# Patient Record
Sex: Female | Born: 1935
Health system: Southern US, Community
[De-identification: ages and names within clinical notes are randomized; demographics above are authoritative.]

## PROBLEM LIST (undated history)

## (undated) DIAGNOSIS — I10 Essential (primary) hypertension: Secondary | ICD-10-CM

## (undated) DIAGNOSIS — Z9889 Other specified postprocedural states: Secondary | ICD-10-CM

## (undated) DIAGNOSIS — E785 Hyperlipidemia, unspecified: Secondary | ICD-10-CM

## (undated) DIAGNOSIS — K219 Gastro-esophageal reflux disease without esophagitis: Secondary | ICD-10-CM

## (undated) DIAGNOSIS — C801 Malignant (primary) neoplasm, unspecified: Secondary | ICD-10-CM

## (undated) DIAGNOSIS — D126 Benign neoplasm of colon, unspecified: Secondary | ICD-10-CM

## (undated) DIAGNOSIS — I4891 Unspecified atrial fibrillation: Secondary | ICD-10-CM

## (undated) DIAGNOSIS — R112 Nausea with vomiting, unspecified: Secondary | ICD-10-CM

## (undated) DIAGNOSIS — J343 Hypertrophy of nasal turbinates: Secondary | ICD-10-CM

## (undated) DIAGNOSIS — J342 Deviated nasal septum: Secondary | ICD-10-CM

## (undated) DIAGNOSIS — C8330 Diffuse large B-cell lymphoma, unspecified site: Secondary | ICD-10-CM

## (undated) DIAGNOSIS — M47816 Spondylosis without myelopathy or radiculopathy, lumbar region: Secondary | ICD-10-CM

## (undated) DIAGNOSIS — R42 Dizziness and giddiness: Secondary | ICD-10-CM

## (undated) DIAGNOSIS — N3281 Overactive bladder: Secondary | ICD-10-CM

## (undated) DIAGNOSIS — L299 Pruritus, unspecified: Secondary | ICD-10-CM

## (undated) HISTORY — DX: Pruritus, unspecified: L29.9

## (undated) HISTORY — DX: Overactive bladder: N32.81

## (undated) HISTORY — DX: Unspecified atrial fibrillation: I48.91

## (undated) HISTORY — DX: Benign neoplasm of colon, unspecified: D12.6

## (undated) HISTORY — PX: BUNIONECTOMY: SHX129

## (undated) HISTORY — DX: Diffuse large B-cell lymphoma, unspecified site: C83.30

## (undated) HISTORY — PX: HAMMER TOE SURGERY: SHX385

## (undated) HISTORY — DX: Dizziness and giddiness: R42

---

## 2001-03-01 ENCOUNTER — Ambulatory Visit (HOSPITAL_COMMUNITY): Admission: RE | Admit: 2001-03-01 | Discharge: 2001-03-01 | Payer: Self-pay | Admitting: Unknown Physician Specialty

## 2001-03-01 ENCOUNTER — Encounter: Payer: Self-pay | Admitting: Unknown Physician Specialty

## 2002-03-03 ENCOUNTER — Ambulatory Visit (HOSPITAL_COMMUNITY): Admission: RE | Admit: 2002-03-03 | Discharge: 2002-03-03 | Payer: Self-pay | Admitting: Unknown Physician Specialty

## 2002-03-03 ENCOUNTER — Encounter: Payer: Self-pay | Admitting: Unknown Physician Specialty

## 2002-09-28 ENCOUNTER — Ambulatory Visit (HOSPITAL_COMMUNITY): Admission: RE | Admit: 2002-09-28 | Discharge: 2002-09-28 | Payer: Self-pay | Admitting: Internal Medicine

## 2002-09-28 HISTORY — PX: COLONOSCOPY: SHX174

## 2003-03-14 ENCOUNTER — Encounter: Payer: Self-pay | Admitting: Unknown Physician Specialty

## 2003-03-14 ENCOUNTER — Ambulatory Visit (HOSPITAL_COMMUNITY): Admission: RE | Admit: 2003-03-14 | Discharge: 2003-03-14 | Payer: Self-pay | Admitting: Unknown Physician Specialty

## 2003-10-08 ENCOUNTER — Encounter: Payer: Self-pay | Admitting: Cardiology

## 2003-10-08 ENCOUNTER — Inpatient Hospital Stay (HOSPITAL_COMMUNITY): Admission: AD | Admit: 2003-10-08 | Discharge: 2003-10-09 | Payer: Self-pay | Admitting: Cardiology

## 2003-10-22 ENCOUNTER — Ambulatory Visit (HOSPITAL_COMMUNITY): Admission: RE | Admit: 2003-10-22 | Discharge: 2003-10-22 | Payer: Self-pay | Admitting: Cardiology

## 2004-03-24 ENCOUNTER — Ambulatory Visit (HOSPITAL_COMMUNITY): Admission: RE | Admit: 2004-03-24 | Discharge: 2004-03-24 | Payer: Self-pay | Admitting: Family Medicine

## 2004-06-03 ENCOUNTER — Ambulatory Visit: Payer: Self-pay | Admitting: *Deleted

## 2004-06-18 ENCOUNTER — Ambulatory Visit: Payer: Self-pay | Admitting: Family Medicine

## 2004-10-23 ENCOUNTER — Ambulatory Visit: Payer: Self-pay | Admitting: Family Medicine

## 2004-10-30 ENCOUNTER — Ambulatory Visit (HOSPITAL_COMMUNITY): Admission: RE | Admit: 2004-10-30 | Discharge: 2004-10-30 | Payer: Self-pay | Admitting: Family Medicine

## 2004-11-04 ENCOUNTER — Ambulatory Visit: Payer: Self-pay | Admitting: Family Medicine

## 2004-11-17 ENCOUNTER — Ambulatory Visit: Payer: Self-pay | Admitting: Family Medicine

## 2005-03-03 ENCOUNTER — Ambulatory Visit: Payer: Self-pay | Admitting: Family Medicine

## 2005-03-10 ENCOUNTER — Ambulatory Visit: Payer: Self-pay | Admitting: Family Medicine

## 2005-03-26 ENCOUNTER — Ambulatory Visit (HOSPITAL_COMMUNITY): Admission: RE | Admit: 2005-03-26 | Discharge: 2005-03-26 | Payer: Self-pay | Admitting: Family Medicine

## 2005-04-09 ENCOUNTER — Other Ambulatory Visit: Admission: RE | Admit: 2005-04-09 | Discharge: 2005-04-09 | Payer: Self-pay | Admitting: Unknown Physician Specialty

## 2005-04-28 ENCOUNTER — Ambulatory Visit: Payer: Self-pay | Admitting: Family Medicine

## 2005-09-02 ENCOUNTER — Ambulatory Visit: Payer: Self-pay | Admitting: Family Medicine

## 2005-10-13 ENCOUNTER — Ambulatory Visit: Payer: Self-pay | Admitting: Family Medicine

## 2005-10-20 ENCOUNTER — Ambulatory Visit (HOSPITAL_COMMUNITY): Admission: RE | Admit: 2005-10-20 | Discharge: 2005-10-20 | Payer: Self-pay | Admitting: Orthopaedic Surgery

## 2005-10-20 ENCOUNTER — Encounter (INDEPENDENT_AMBULATORY_CARE_PROVIDER_SITE_OTHER): Payer: Self-pay | Admitting: *Deleted

## 2005-11-26 ENCOUNTER — Ambulatory Visit: Payer: Self-pay | Admitting: Family Medicine

## 2005-12-15 ENCOUNTER — Ambulatory Visit (HOSPITAL_COMMUNITY): Admission: RE | Admit: 2005-12-15 | Discharge: 2005-12-15 | Payer: Self-pay | Admitting: Ophthalmology

## 2006-01-11 ENCOUNTER — Ambulatory Visit: Payer: Self-pay | Admitting: Family Medicine

## 2006-01-19 ENCOUNTER — Ambulatory Visit (HOSPITAL_COMMUNITY): Admission: RE | Admit: 2006-01-19 | Discharge: 2006-01-19 | Payer: Self-pay | Admitting: Ophthalmology

## 2006-04-01 ENCOUNTER — Ambulatory Visit (HOSPITAL_COMMUNITY): Admission: RE | Admit: 2006-04-01 | Discharge: 2006-04-01 | Payer: Self-pay | Admitting: Family Medicine

## 2006-05-13 ENCOUNTER — Ambulatory Visit: Payer: Self-pay | Admitting: Family Medicine

## 2006-06-28 ENCOUNTER — Ambulatory Visit: Payer: Self-pay | Admitting: Family Medicine

## 2006-07-01 ENCOUNTER — Ambulatory Visit (HOSPITAL_COMMUNITY): Admission: RE | Admit: 2006-07-01 | Discharge: 2006-07-01 | Payer: Self-pay | Admitting: Family Medicine

## 2006-07-02 ENCOUNTER — Ambulatory Visit: Payer: Self-pay | Admitting: Cardiology

## 2006-07-07 ENCOUNTER — Ambulatory Visit: Payer: Self-pay | Admitting: Cardiology

## 2006-07-07 ENCOUNTER — Ambulatory Visit (HOSPITAL_COMMUNITY): Admission: RE | Admit: 2006-07-07 | Discharge: 2006-07-07 | Payer: Self-pay | Admitting: Family Medicine

## 2006-09-21 ENCOUNTER — Ambulatory Visit: Payer: Self-pay | Admitting: Family Medicine

## 2007-04-05 ENCOUNTER — Ambulatory Visit (HOSPITAL_COMMUNITY): Admission: RE | Admit: 2007-04-05 | Discharge: 2007-04-05 | Payer: Self-pay | Admitting: Family Medicine

## 2007-06-09 DIAGNOSIS — R112 Nausea with vomiting, unspecified: Secondary | ICD-10-CM

## 2007-06-09 DIAGNOSIS — Z9889 Other specified postprocedural states: Secondary | ICD-10-CM

## 2007-06-09 HISTORY — DX: Other specified postprocedural states: Z98.890

## 2007-06-09 HISTORY — DX: Other specified postprocedural states: R11.2

## 2007-06-09 HISTORY — PX: TOTAL HIP ARTHROPLASTY: SHX124

## 2007-12-12 ENCOUNTER — Inpatient Hospital Stay (HOSPITAL_COMMUNITY): Admission: RE | Admit: 2007-12-12 | Discharge: 2007-12-15 | Payer: Self-pay | Admitting: Orthopedic Surgery

## 2008-04-05 ENCOUNTER — Ambulatory Visit (HOSPITAL_COMMUNITY): Admission: RE | Admit: 2008-04-05 | Discharge: 2008-04-05 | Payer: Self-pay | Admitting: Family Medicine

## 2008-06-08 HISTORY — PX: CATARACT EXTRACTION W/ INTRAOCULAR LENS  IMPLANT, BILATERAL: SHX1307

## 2008-08-30 ENCOUNTER — Encounter: Payer: Self-pay | Admitting: Internal Medicine

## 2008-09-11 ENCOUNTER — Encounter: Payer: Self-pay | Admitting: Internal Medicine

## 2008-09-11 ENCOUNTER — Ambulatory Visit (HOSPITAL_COMMUNITY): Admission: RE | Admit: 2008-09-11 | Discharge: 2008-09-11 | Payer: Self-pay | Admitting: Internal Medicine

## 2008-09-11 ENCOUNTER — Telehealth (INDEPENDENT_AMBULATORY_CARE_PROVIDER_SITE_OTHER): Payer: Self-pay | Admitting: *Deleted

## 2008-09-11 ENCOUNTER — Ambulatory Visit: Payer: Self-pay | Admitting: Internal Medicine

## 2008-09-11 HISTORY — PX: COLONOSCOPY: SHX174

## 2008-09-12 ENCOUNTER — Encounter: Payer: Self-pay | Admitting: Internal Medicine

## 2008-09-13 ENCOUNTER — Telehealth (INDEPENDENT_AMBULATORY_CARE_PROVIDER_SITE_OTHER): Payer: Self-pay

## 2008-09-17 ENCOUNTER — Telehealth (INDEPENDENT_AMBULATORY_CARE_PROVIDER_SITE_OTHER): Payer: Self-pay | Admitting: *Deleted

## 2008-09-18 ENCOUNTER — Encounter: Payer: Self-pay | Admitting: Internal Medicine

## 2008-10-06 DIAGNOSIS — D126 Benign neoplasm of colon, unspecified: Secondary | ICD-10-CM

## 2008-10-06 HISTORY — DX: Benign neoplasm of colon, unspecified: D12.6

## 2008-10-19 ENCOUNTER — Encounter: Payer: Self-pay | Admitting: Internal Medicine

## 2008-10-29 ENCOUNTER — Ambulatory Visit: Payer: Self-pay | Admitting: Internal Medicine

## 2008-10-29 ENCOUNTER — Inpatient Hospital Stay (HOSPITAL_COMMUNITY): Admission: RE | Admit: 2008-10-29 | Discharge: 2008-11-02 | Payer: Self-pay | Admitting: General Surgery

## 2008-10-29 ENCOUNTER — Encounter (INDEPENDENT_AMBULATORY_CARE_PROVIDER_SITE_OTHER): Payer: Self-pay | Admitting: General Surgery

## 2008-10-29 HISTORY — PX: COLON SURGERY: SHX602

## 2008-10-30 ENCOUNTER — Encounter (INDEPENDENT_AMBULATORY_CARE_PROVIDER_SITE_OTHER): Payer: Self-pay

## 2008-11-12 DIAGNOSIS — Z8669 Personal history of other diseases of the nervous system and sense organs: Secondary | ICD-10-CM | POA: Insufficient documentation

## 2008-11-12 DIAGNOSIS — E785 Hyperlipidemia, unspecified: Secondary | ICD-10-CM | POA: Insufficient documentation

## 2008-11-12 DIAGNOSIS — I10 Essential (primary) hypertension: Secondary | ICD-10-CM

## 2008-11-13 ENCOUNTER — Encounter: Payer: Self-pay | Admitting: Physician Assistant

## 2008-11-13 ENCOUNTER — Ambulatory Visit: Payer: Self-pay | Admitting: Cardiology

## 2008-11-13 DIAGNOSIS — I498 Other specified cardiac arrhythmias: Secondary | ICD-10-CM

## 2008-11-13 DIAGNOSIS — I4891 Unspecified atrial fibrillation: Secondary | ICD-10-CM

## 2008-11-20 ENCOUNTER — Ambulatory Visit: Payer: Self-pay | Admitting: Cardiology

## 2008-11-20 ENCOUNTER — Encounter: Payer: Self-pay | Admitting: Physician Assistant

## 2008-11-27 ENCOUNTER — Ambulatory Visit: Payer: Self-pay | Admitting: Cardiology

## 2008-12-03 ENCOUNTER — Telehealth: Payer: Self-pay | Admitting: Cardiology

## 2008-12-03 ENCOUNTER — Encounter: Payer: Self-pay | Admitting: Internal Medicine

## 2008-12-27 ENCOUNTER — Encounter: Payer: Self-pay | Admitting: Internal Medicine

## 2009-04-09 ENCOUNTER — Ambulatory Visit (HOSPITAL_COMMUNITY): Admission: RE | Admit: 2009-04-09 | Discharge: 2009-04-09 | Payer: Self-pay | Admitting: Family Medicine

## 2009-07-16 ENCOUNTER — Ambulatory Visit (HOSPITAL_COMMUNITY): Admission: RE | Admit: 2009-07-16 | Discharge: 2009-07-16 | Payer: Self-pay | Admitting: Ophthalmology

## 2009-11-28 ENCOUNTER — Encounter (INDEPENDENT_AMBULATORY_CARE_PROVIDER_SITE_OTHER): Payer: Self-pay

## 2009-12-04 ENCOUNTER — Telehealth (INDEPENDENT_AMBULATORY_CARE_PROVIDER_SITE_OTHER): Payer: Self-pay

## 2009-12-04 ENCOUNTER — Encounter: Payer: Self-pay | Admitting: Internal Medicine

## 2009-12-10 ENCOUNTER — Telehealth (INDEPENDENT_AMBULATORY_CARE_PROVIDER_SITE_OTHER): Payer: Self-pay | Admitting: *Deleted

## 2010-01-06 ENCOUNTER — Ambulatory Visit: Payer: Self-pay | Admitting: Internal Medicine

## 2010-01-06 ENCOUNTER — Ambulatory Visit (HOSPITAL_COMMUNITY): Admission: RE | Admit: 2010-01-06 | Discharge: 2010-01-06 | Payer: Self-pay | Admitting: Internal Medicine

## 2010-01-06 HISTORY — PX: COLONOSCOPY: SHX174

## 2010-04-11 ENCOUNTER — Ambulatory Visit (HOSPITAL_COMMUNITY): Admission: RE | Admit: 2010-04-11 | Discharge: 2010-04-11 | Payer: Self-pay | Admitting: Family Medicine

## 2010-06-29 ENCOUNTER — Encounter: Payer: Self-pay | Admitting: Family Medicine

## 2010-07-08 NOTE — Letter (Signed)
Summary: Recall Colonoscopy/Endoscopy, Change to Office Visit  [facility]  [address]   [place], [place] [address]   Phone: [phone]  Fax: [phone]      November 28, 2009   [place] [address] [place], [place]  [address] 1922/07/22   Dear Sierra Good,   According to our records, it is time for you to schedule a Colonoscopy/Endoscopy. However, after reviewing your medical record, we recommend an office visit in order to determine your need for a repeat procedure.  Please call [phone] at your convenience to schedule an office visit. If you have any questions or concerns, please feel free to contact our office.   Sincerely,   Cloria Spring LPN  [facility] Ph: [phone]   Fax: [phone]

## 2010-07-08 NOTE — Progress Notes (Signed)
Summary: [facility] TCS  Phone Note Call from Patient   Reason for Call: Talk to Nurse Summary of Call: Pt LMOM. She wants to [facility] her TCS that is scheduled for the 18th to another day where she will have transportation. [phone] Initial call taken by: Diana Eves,  December 10, 2009 3:58 PM     Appended Document: [facility] TCS Pt rescheduled to 01/06/2010 @ 8:15 AM. Selena Batten is aware.

## 2010-07-08 NOTE — Progress Notes (Signed)
Summary: tcs ?s  Phone Note Call from Patient Call back at Home Phone [phone]   Caller: Patient Summary of Call: pt called- she is on recall list to have a repeat tcs in 10/2009. pt had a colectomy last year and is on ASA. She is scheduled for tcs on 12/23/09 but informed her that she may need ov first and that I would check with RMR and let her know.   1- Does pt need ov?  2- Does pt need to stop ASA?  please advise Initial call taken by: Hendricks Limes LPN,  December 04, 2009 9:37 AM     Appended Document: tcs ?s triage sheet scanned into emr- paper version on RMR desk  Appended Document: tcs ?s may just be triaged wo ov; ok to stay on asa  Appended Document: tcs ?s left message for pt with above info. pt instructions in mail

## 2010-07-08 NOTE — Letter (Signed)
Summary: triage  triage   Imported By: Hendricks Limes LPN 16/03/9603 54:09:81  _____________________________________________________________________  External Attachment:    Type:   Image     Comment:   External Document

## 2010-09-16 LAB — CARDIAC PANEL(CRET KIN+CKTOT+MB+TROPI)
CK, MB: 2.4 ng/mL (ref 0.3–4.0)
Relative Index: INVALID (ref 0.0–2.5)
Troponin I: 0.01 ng/mL (ref 0.00–0.06)

## 2010-09-16 LAB — COMPREHENSIVE METABOLIC PANEL
ALT: 14 U/L (ref 0–35)
AST: 23 U/L (ref 0–37)
Albumin: 4.1 g/dL (ref 3.5–5.2)
CO2: 30 mEq/L (ref 19–32)
Creatinine, Ser: 0.89 mg/dL (ref 0.4–1.2)
GFR calc Af Amer: >60 mL/min (ref >60)
GFR calc non Af Amer: >60 mL/min (ref >60)
Total Bilirubin: 0.7 mg/dL (ref 0.3–1.2)
Total Protein: 7 g/dL (ref 6.0–8.3)

## 2010-09-16 LAB — CBC
HCT: 41.8 % (ref 36.0–46.0)
Hemoglobin: 14.1 g/dL (ref 12.0–15.0)
Hemoglobin: 14.4 g/dL (ref 12.0–15.0)
MCHC: 33.9 g/dL (ref 30.0–36.0)
MCHC: 34.1 g/dL (ref 30.0–36.0)
MCV: 94.2 fL (ref 78.0–100.0)
MCV: 94.5 fL (ref 78.0–100.0)
MCV: 94.6 fL (ref 78.0–100.0)
MCV: 95.4 fL (ref 78.0–100.0)
Platelets: 213 10*3/uL (ref 150–400)
Platelets: 239 10*3/uL (ref 150–400)
Platelets: 241 10*3/uL (ref 150–400)
RBC: 4.28 MIL/uL (ref 3.87–5.11)
RBC: 4.42 MIL/uL (ref 3.87–5.11)
RBC: 4.79 MIL/uL (ref 3.87–5.11)
RDW: 14.1 % (ref 11.5–15.5)
WBC: 12.1 10*3/uL — ABNORMAL HIGH (ref 4.0–10.5)
WBC: 16.9 10*3/uL — ABNORMAL HIGH (ref 4.0–10.5)
WBC: 8.3 10*3/uL (ref 4.0–10.5)

## 2010-09-16 LAB — DIFFERENTIAL
Lymphs Abs: 1.8 10*3/uL (ref 0.7–4.0)
Neutro Abs: 5.7 10*3/uL (ref 1.7–7.7)
Neutrophils Relative %: 69 % (ref 43–77)

## 2010-09-16 LAB — BASIC METABOLIC PANEL
BUN: 12 mg/dL (ref 6–23)
BUN: 9 mg/dL (ref 6–23)
CO2: 21 mEq/L (ref 19–32)
CO2: 27 mEq/L (ref 19–32)
Calcium: 8.7 mg/dL (ref 8.4–10.5)
Calcium: 9.3 mg/dL (ref 8.4–10.5)
Chloride: 102 mEq/L (ref 96–112)
Chloride: 103 mEq/L (ref 96–112)
Chloride: 107 mEq/L (ref 96–112)
Creatinine, Ser: 0.74 mg/dL (ref 0.4–1.2)
GFR calc Af Amer: >60 mL/min (ref >60)
GFR calc Af Amer: >60 mL/min (ref >60)
GFR calc Af Amer: >60 mL/min (ref >60)
GFR calc non Af Amer: >60 mL/min (ref >60)
Potassium: 3.2 mEq/L — ABNORMAL LOW (ref 3.5–5.1)
Potassium: 4.5 mEq/L (ref 3.5–5.1)
Sodium: 136 mEq/L (ref 135–145)
Sodium: 138 mEq/L (ref 135–145)

## 2010-09-16 LAB — MAGNESIUM: Magnesium: 1.6 mg/dL (ref 1.5–2.5)

## 2010-09-16 LAB — URINALYSIS, ROUTINE W REFLEX MICROSCOPIC
Glucose, UA: NEGATIVE mg/dL
Hgb urine dipstick: NEGATIVE
Ketones, ur: NEGATIVE mg/dL
Nitrite: NEGATIVE
Protein, ur: NEGATIVE mg/dL
Urobilinogen, UA: 0.2 mg/dL (ref 0.0–1.0)
pH: 6.5 (ref 5.0–8.0)

## 2010-09-16 LAB — TSH: TSH: 0.467 u[IU]/mL (ref 0.350–4.500)

## 2010-10-21 NOTE — Op Note (Signed)
[name], Sierra Good             ACCOUNT NO.:  [id number]   MEDICAL RECORD NO.:  [id number]          PATIENT TYPE:  INP   LOCATION:  0008                         FACILITY:  [facility]   PHYSICIAN:  Angelia Mould. Derrell Lolling, M.D.DATE OF BIRTH:  June 17, 1922   DATE OF PROCEDURE:  10/29/2008  DATE OF DISCHARGE:                               OPERATIVE REPORT   PREOPERATIVE DIAGNOSIS:  Villous adenoma of the cecum.   POSTOPERATIVE DIAGNOSIS:  Villous adenoma of the cecum.   OPERATION PERFORMED:  Laparoscopic-assisted right colectomy.   SURGEON:  Dr. Claud Kelp   FIRST ASSISTANT:  Dr. Consuello Bossier.   OPERATIVE INDICATIONS:  This is a 77 year old Caucasian female in good  health.  She has a family history of colon cancer in her mother.  She  had a screening colonoscopy and this showed a moderately large flat  sessile polypoid lesion around the ileocecal valve.  This was partially  debulked and all the fragments showed tubulovillous adenoma.  There was  no evidence of high-grade dysplasia.  She was advised to consider with a  segmental resection of this to prevent transformation into cancer.  She  thought about this for a while but ultimately decided that she would go  ahead and have the surgery.  She has undergone a bowel prep at home.  She states she has been off her aspirin for 1 week.  She is brought to  hospital electively.   OPERATIVE FINDINGS:  Visually there were no abnormalities in the  abdomen.  The liver, gallbladder, stomach and duodenum, colon, small  bowel all looked normal.  The appendix looked normal.  After I resected  the specimen.  I opened it up and I saw areas of villous adenoma  polypoid lesion in the cecum around the ileocecal valve.  There was  nothing that looked like malignant change.  We probably resected about 3  inches of terminal ileum and about 8 or 9 inches of the right colon.   OPERATIVE TECHNIQUE:  Following induction of general endotracheal  anesthesia  the patient's abdomen was prepped and draped in a sterile  fashion.  Foley catheter had been placed prior to prepping.  The patient  was identified as correct patient, correct procedure, correct site.  Intravenous antibiotics were given.  0.5% Marcaine with epinephrine was  used as a local infiltration anesthetic.  A vertically oriented incision  was made at the upper rim of the umbilicus.  The fascia was incised in  the midline and the abdominal cavity entered under direct vision.  An 11-  mm Hassan trocar was inserted and secured with a pursestring suture of 0  Vicryl.  Pneumoperitoneum was created.  Video cam was inserted with  visualization findings as described above.  A 5-mm trocar was placed in  the lower midline and a 5-mm trocar placed in the left midabdomen.   After survey of the abdomen, the patient was placed in Trendelenburg  position and rotated to the left.  We ran the small bowel and identified  the ileocecal valve.  We identified the appendix and cecum.  Using the  harmonic scalpel  and blunt dissection we divided the lateral peritoneal  attachments and mobilized the terminal ileum and the cecum up off the  lateral pelvic sidewall.  With blunt dissection we mobilized the cecum  up to the midline.  We divided the lateral peritoneal attachments of the  descending colon.  We then placed the patient in reverse Trendelenburg  position and took down the hepatic flexure a little bit at a time.  We  identified the stomach, the pylorus, the duodenum.  We mobilized the  right transverse colon off of the duodenum and out from under the liver  without any problem.  We mobilized it back toward the midline.  At this  point we had a fairly mobile right colon.  We checked for bleeding.  There was none.   We released the pneumoperitoneum.  We removed the Hasson trocar above  the umbilicus.  We made about a 7 or 8-cm incision in the midline partly  above and partly below the umbilicus.   The fascia was incised in the  midline.  The abdominal cavity entered.  We placed a self-retaining  protractor and wound protector.  We delivered the terminal ileum and  cecum and right colon and in fact the entire hepatic flexure through the  wound.  We set up the resection.  We isolated the ileocolic vessels.  These were skeletonized and then clamped, divided and ligated with 2-0  silk ties.  Larger vessels were doubly ligated with 2-0 silk ties.  We  then cleaned off the mesentery of the terminal ileum, ligating it with 2-  0 silk ties.  Likewise we took down the mesentery near the hepatic  flexure.  We completely preserved the middle colic vessels.  We cleaned  a little bit of the fat off of the colon just past the hepatic flexure  and set up the anastomosis.  We made an enterotomy in the small bowel  and enterotomy in the colon through a tinea and inserted a 75 mm GIA  stapling device.  This was passed down through both lumens, the colon  was rotated to keep the mesentery of the way and the stapler was closed,  held in placed for 30 seconds, fired and removed.  We placed Allis  clamps on the staple lines and examined the lumen.  There was no  bleeding from staple lines.  We used a TA-60 stapler to transect and  close the defect in the bowel wall.  This provided a very nice  anastomosis.   I took the specimen to a side table and opened it up and could identify  the polypoid mass and around the ileocecal valve.  This was sent for  routine histology.   We changed our gloves and instruments at this point.  We placed a few  silk sutures to reinforce the staple line in critical points.  We tested  the anastomosis and it appeared to be intact.  The lumen was greater  than two fingerbreadths.  The mesentery was closed with interrupted  figure-of-eight sutures of 2-0 silk.  We irrigated the specimen off and  then returned it to the abdominal cavity.  The midline fascia was closed  with  running suture of #1 PDS and the wound irrigated and skin closed  with skin staples.   We reinflated the abdomen.  A 5-mm camera was inserted.  We irrigated  the subphrenic space, the subhepatic space.  The right paracolic gutter  and the pelvis until all the irrigation  fluid was completely clear.  We  took a look at the anastomosis and everything looked fine.  There did  not appear to be any bleeding.  We placed the omentum down on  top of this.  The trocars were removed.  The pneumoperitoneum was  released.  All the skin incisions were closed with skin staples.  Clean  bandages were placed and the patient was taken to the recovery room in  stable condition.  Estimated blood loss was about 50 mL or probably  less.  Sponge, needle and instrument counts were correct.      Angelia Mould. Derrell Lolling, M.D.  Electronically Signed     HMI/MEDQ  D:  10/29/2008  T:  10/29/2008  Job:  [id number]   cc:   R. Roetta Sessions, M.D.  [address]  [place]  [place] [address]   Delaney Meigs, M.D.  Fax: [phone]

## 2010-10-21 NOTE — Discharge Summary (Signed)
Sierra Good, Sierra Good             ACCOUNT NO.:  [id number]   MEDICAL RECORD NO.:  [id number]           PATIENT TYPE:   LOCATION:                                 FACILITY:   PHYSICIAN:  Angelia Mould. Derrell Lolling, M.D.DATE OF BIRTH:  [birth year]   DATE OF ADMISSION:  10/29/2008  DATE OF DISCHARGE:  11/02/2008                               DISCHARGE SUMMARY   FINAL DIAGNOSES:  Villous adenoma of the right colon with high-grade  dysplasia.   OPERATION PERFORMED:  Laparoscopic-assisted right colectomy. Date of  surgery Oct 29, 2008.   HISTORY IS:  This is a fairly healthy 77 year old Caucasian female with  a positive family history of colon cancer in her mother. This patient  had a normal colonoscopy in 2004 and underwent a repeat screening  colonoscopy on September 11, 2008 and was found to have a large adenomatous  polyp at the ileocecal valve.  Biopsy showed villous adenoma.  This  could not be resected because of its sessile nature.  She was advised to  consider surgical intervention.  Dr. Jena Gauss sent her to me.  She was  evaluated as an outpatient.  Considering her fairly normal performance  status I told her that a laparoscopic-assisted right colectomy would be  a reasonable way to manage this and to prevent progression of the  cancer.  She considered this and agreed.  She underwent bowel prep at  home and was brought to the operating room electively.   OPERATIVE REPORT:  On the day of admission the patient underwent a  laparoscopic-assisted right colectomy.  This was fairly uneventful.  We  could see the polyp in the cecum on opening the specimen.  Final  pathology report showed villous adenoma with focal high-grade dysplasia  but no malignancy was seen and all the lymph nodes were negative.   The patient went into atrial fibrillation in the OR with a rate of 120.  Upon emergence from general anesthesia she was comfortable and  asymptomatic with this.  [facility] was called and saw  her.  The  potassium in the recovery room was 3.2.  This was repleted back to a  normal value.  She converted back to normal sinus rhythm by the  following morning.   By postoperative day #2 the patient was tolerating clear liquids and  getting up and out of bed and voiding independently and remained in  sinus rhythm.  She was on Entereg protocol for postop ileus and was  advanced into a full liquid diet at that time.   She advanced in her diet and activities without any problem.  She began  having bowel movements and was discharged home on Nov 01, 2008.  At that  time she was ambulatory, voiding well, tolerating solid food and having  bowel movements and her wound looked fine.  Her hematocrit was 41.8,  white blood cell count 12,100, glucose 103, BUN 18, potassium 4.5.  Discharge medications included amlodipine 2.5 mg daily, omeprazole 20 mg  daily,  multivitamins, CoQ10,  and aspirin 81 mg daily.  She was asked to return  to see me  in the office in 1 week for wound check and staple removal.  She was asked to follow up with her primary care physician in [place]  and would call for that appointment.      Angelia Mould. Derrell Lolling, M.D.  Electronically Signed     HMI/MEDQ  D:  11/15/2008  T:  11/15/2008  Job:  [id number]   cc:   [facility]   Delaney Meigs, M.D.  Fax: [phone]   R. Roetta Sessions, M.D.  [address]  [place]  [place] [address]

## 2010-10-21 NOTE — Consult Note (Signed)
NAMEKERRY-ANNE, Good             ACCOUNT NO.:  [id number]   MEDICAL RECORD NO.:  [id number]          PATIENT TYPE:  INP   LOCATION:  1421                         [facility]ACILITY[facility]   PHYSICIAN:  Pricilla Riffle, MD, FACCDATE OF BIRTH:  02/25/1923   DATE OF CONSULTATION:  10/29/2008  DATE OF DISCHARGE:                                 CONSULTATION   IDENTIFICATION:  Sierra Good is an 77 year old woman who we are asked to see  regarding atrial fibrillation.   HISTORY OF PRESENT ILLNESS:  The patient denies history of rhythm  problems in the past.  Note, she had a workup for syncope in 2005.  Echocardiogram was normal.  Carotid Dopplers were negative by report in  2008.  Holter monitor was done shows sinus rhythm, minimum heart rate of  43 beats per minute.  Rare PVCs.  Echocardiogram at that time showed  mild LVH, hyperdynamic LV, no significant valvular abnormalities.   The patient denies a history of palpitations.  No dizziness.  No chest  pain.   Today, she underwent a laparoscopic right colectomy.  Intraop, she did  have heart rate into the 130s, blood pressure 150-180.  Postop, again  atrial fibrillation.  She is currently without chest pain or shortness  of breath.  Says her throat is sore and her abdomen hurts.   ALLERGIES:  LOZOL, LIPITOR, ACTONEL, CRESTOR, CELEBREX.   PAST MEDICAL HISTORY:  1. History of villous adenocarcinoma of the cecum, status post right      colectomy.  2. Hypertension.  3. GE reflux, hiatal hernia.  4. Dyslipidemia.  5. Arthritis.  6. History of sinus bradycardia with PACs and PVCs.  7. Shingles.   MEDICATIONS PRIOR TO ADMISSION:  1. Amlodipine 2.5.  2. Prilosec.  3. Multivitamin.  4. Aspirin 81 mg daily.  5. Protonix.  6. IV fluids.  7. Cefoxitin.  8. Lumigan.   SOCIAL HISTORY:  The patient lives in [place].  She is widowed.  She  has a Surveyor, quantity.  Retired from [employer].  Does not smoke, does not  drink.   FAMILY HISTORY:  Question if  mother had AFib.  No history of premature  CAD.   REVIEW OF SYSTEMS:  All systems reviewed negative to the above problem  except as noted above.   PHYSICAL EXAMINATION:  General:  On exam, the patient is in no acute  distress.  Vital signs:  Temperature is 96.8, blood pressure is 155/75, pulse is in  the 80s, had been 100 previously, O2 sat on 2 L 98%.  HEENT:  Normocephalic, atraumatic.  EOMI.  PERRL.  Mucous membranes are  moist.  NECK:  No bruits.  JVP is normal.  No thyromegaly.  LUNGS:  Clear to auscultation without significant wheezes or rales.  CARDIAC:  Regularly irregular S1-S2.  No S3.  No significant murmurs.  ABDOMEN:  Midline incision is dressed.  No hepatomegaly.  EXTREMITIES:  Good distal pulses.  No lower extremity edema.   LABORATORY DATA:  A 12-lead EKG shows atrial fibrillation with a  ventricular rate of 119 beats per minute.  Occasional PVC.  Slight ST  depression in the inferior and lateral leads with T-wave inversion.   Labs are significant for a BUN and creatinine of 12 and 1.1, potassium  of 3.2.  Chest x-ray pending.   IMPRESSION:  The patient is an 77 year old woman with no known history  of atrial fibrillation now, postop with AFib.  I reviewed her vitals  during surgery.  She may have developed this in the OR.  Currently rates  are in the 80s and without any medical interventions.  Note, an  echocardiogram was done in 2008 that showed mild LVH, hyperdynamic LV  function.   1. The atrial fibrillation may indeed be stress-induced with surgery,      hemodynamically stable at present which could try low-dose      diltiazem but need to watch heart rate, may revert to sinus rhythm.      Continue telemetry, replete K, check TSH.  We will follow.  No      anticoagulation for now given surgery and possible transiently.  2. Hypertension.  We will follow.      Pricilla Riffle, MD, [facility]  Electronically Signed     PVR/MEDQ  D:  10/29/2008  T:  10/30/2008   Job:  [id number]

## 2010-10-21 NOTE — Discharge Summary (Signed)
Sierra Good, Sierra Good             ACCOUNT NO.:  [id number]   MEDICAL RECORD NO.:  [id number]          PATIENT TYPE:  INP   LOCATION:  1619                         FACILITY:  [facility]   PHYSICIAN:  Madlyn Frankel. Charlann Boxer, M.D.  DATE OF BIRTH:  February 23, 1923   DATE OF ADMISSION:  12/12/2007  DATE OF DISCHARGE:  12/15/2007                               DISCHARGE SUMMARY   ADMITTING DIAGNOSES:  1. Osteoarthritis.  2. Hypertension.  3. Reflux disease for dyslipidemia.   DISCHARGE DIAGNOSES:  1. Osteoarthritis.  2. Hypertension.  3. Reflux disease.  4. Dyslipidemia.   HISTORY OF PRESENT ILLNESS:  An 77 year old female with a history of  right hip pain secondary to osteoarthritis.  It was refractory to all  conservative treatment.  Presurgically cleared by her primary care  physician, Dr. Joette Catching.   CONSULTS:  Pharmacy for Coumadin.   PROCEDURE:  Was a right total hip arthroplasty.   SURGEON:  Dr. Durene Romans.   ASSISTANT:  Dwyane Luo PA-C.   LABS:  CBC on admission:  Hemoglobin 15.4, hematocrit 44.8.  Her  platelets were 257 tracked throughout the course of stay at time of  discharge.  She was stable with her hemoglobin 9.8, hematocrit 28.3,  platelets 191.  Routine chemistry on admission:  Sodium 142, potassium  3.5, her glucose was 97 and creatinine 0.94.  Tracked throughout her  course of stay at time of discharge:  Sodium 129, potassium 3.8,  creatinine 0.59, her glucose is 129.  Her coags INR was 1, PT was 13.2  and normal.  UA was negative for nitrates but showed a trace leuko  esterase and showed rare bacteria.   RADIOLOGY:  Chest 2-view:  No active cardiopulmonary disease.   EKG preadmission showed sinus bradycardia with sinus arrhythmia,  nonspecific ST and T-wave abnormality, abnormal ECG.   HOSPITAL COURSE:  The patient in the hospital underwent right total hip  arthroplasty, tolerated procedure well.  Coumadin was started on day 1.  By the time she was ready for  discharge it was therapeutic at 2.4.  She  remained afebrile throughout her course of stay.  She made minimum  progress with physical therapy.  Dressing was changed on a daily basis  after day 1 with no significant wound drainage.  She was weightbearing  as tolerated with the use of a rolling walker.  She did have a lot of  nausea and her reflux symptoms were bothersome.  Social work did confirm  that stent placement would then be beneficial for long-term progress.  We saw her on the 9th.  She was afebrile, right hip was dry,  neurovascular intact.  She was ready for a  SNF and we wanted to just  repeated EKG to evaluate her rhythm in comparison her previous EKGs.  She does have a history of some sinus arrhythmias.  Otherwise. she was  stable and feeling ready for discharge SNF.   DISCHARGE DISPOSITION:  Discharge to skilled nurse facility rehab in  stable and improved condition.   DISCHARGE DIET:  Regular as tolerated by the patient.   DISCHARGE WOUND CARE:  Keep wound dry.  If patient would like shower,  cover with a plastic wrap, tape edges with paper tape, do not get wet  and re-dress with dry dressing afterwards.   DISCHARGE:  Physical therapy, weightbearing as tolerated with the use of  rolling walker.  I want to encourage independence in activities of daily  living and work on independent transfers and also work on proprioception  as well as strengthening exercises.   DISCHARGE MEDICATIONS:  1. Coumadin, maintain INR between 2-3.  2. Robaxin 500 mg 1 p.o. q.6 p.r.n. muscle spasm pain.  3. Iron 325 mg 1 p.o. t.i.d. x3 weeks.  4. Colace 100 mg p.o. b.i.d. p.r.n. constipation.  5. MiraLax 17 g p.o. q. day constipation.  6. Vicodin 5/325 1-2 p.o. q.4-6 p.r.n. pain.  7. Aciphex 20 mg 1 p.o. q.a.m., 1 p.o. p.r.n. in the evening with      evening meals as needed.  8. Amlodipine 2.5 mg p.o. q.a.m.   DISCHARGE FOLLOWUP:  Follow up with Dr. Charlann Boxer at phone number in 2 weeks  for  wound check.     ______________________________  Yetta Glassman. [address], [place]      Madlyn Frankel. Charlann Boxer, M.D.  Electronically Signed    BLM/MEDQ  D:  12/15/2007  T:  12/15/2007  Job:  [id number]

## 2010-10-21 NOTE — H&P (Signed)
[name], [name]             ACCOUNT NO.:  [id number]   MEDICAL RECORD NO.:  [id number]         PATIENT TYPE:  LINP   LOCATION:                               [facility]ACILITY[facility]   PHYSICIAN:  Sierra Good, M.D.  DATE OF BIRTH:  05-12-1923   DATE OF ADMISSION:  12/12/2007  DATE OF DISCHARGE:                              HISTORY & PHYSICAL   PROCEDURE:  A right total hip arthroplasty.   CHIEF COMPLAINT:  Right hip pain.   HISTORY OF PRESENT ILLNESS:  An 77 year old female with a history of  right hip pain secondary to osteoarthritis.  It has been refractory to  all conservative treatment.   PAST MEDICAL HISTORY:  Significant for:  1. Osteoarthritis.  2. Hypertension.  3. Reflux disease.  4. Dyslipidemia.   PAST SURGICAL HISTORY:  1. Bunionectomy in 1991.  2. Carpal tunnel release 2004.  3. Cataract surgery in 2007.   FAMILY HISTORY:  Stroke, colon cancer, heart disease.   SOCIAL HISTORY:  Widowed.  She lives alone, is planning on a rehab  facility postoperatively.   Grand daughter-in-law is Public affairs consultant.   DRUG ALLERGIES.  1. LOZOL.  2. LIPITOR.  3. ACTONEL.  4. CRESTOR.  5. CELEBREX.  6. ARTHROTEC.  Please verify with the patient.   MEDICATIONS:  1. Aciphex 20 mg p.o. daily.  2. Amlodipine 2.5 mg p.o. daily.  Please verify dosage and frequency      with the patient.   REVIEW OF SYSTEMS:  No recent health changes.  See HPI.   PHYSICAL EXAMINATION:  Pulse 54.  Respirations 18.  Blood pressure  174/80.  GENERAL:  Awake, alert and oriented, well-developed, well-nourished.  NECK:  Supple.  No carotid bruits.  CHEST/LUNGS:  Clear to auscultation bilaterally.  BREASTS:  Deferred.  HEART:  Regular rate and rhythm.  S1, S2 distinct.  ABDOMEN:  Soft, nontender, nondistended.  Bowel sounds present.  GENITOURINARY:  Deferred.  EXTREMITIES:  Right hip has decreased range of motion and increased  pain.  SKIN:  No cellulitis.  NEUROLOGIC:  Intact distal  sensibilities.   LABS:  EKG, chest x-ray all pending presurgical testing.   IMPRESSION:  Right hip osteoarthritis.   PLAN:  Right hip total arthroplasty.  The risks, complications were  discussed.  The patient is planning on a rehab facility postoperatively.     ______________________________  Sierra Good, [place]      Sierra Good, M.D.  Electronically Signed    BLM/MEDQ  D:  12/06/2007  T:  12/06/2007  Job:  [id number]   cc:   Delaney Meigs, M.D.  Fax: [phone]

## 2010-10-21 NOTE — Op Note (Signed)
Sierra Good, ANDAYA             ACCOUNT NO.:  [id number]   MEDICAL RECORD NO.:  [id number]          PATIENT TYPE:  AMB   LOCATION:  DAY                           FACILITY:  [facility]   PHYSICIAN:  R. Roetta Sessions, M.D. DATE OF BIRTH:  1922-08-23   DATE OF PROCEDURE:  09/11/2008  DATE OF DISCHARGE:                               OPERATIVE REPORT   INDICATIONS FOR PROCEDURE:  An 77 year old lady with a positive family  history of colon cancer in her mother who was diagnosed in her 57s.  Last had a colonoscopy back in April 2004.  She had a long redundant,  otherwise normal colon.  She has no lower GI tract symptoms currently.  Colonoscopy is now being done.  The risks, benefits, alternatives, and  limitations have been reviewed, questions answered, and she is  agreeable.  Please see the documentation in the medical record.   PROCEDURE NOTE:  O2 saturation, blood pressure, pulse, and respirations  were monitored throughout the entire procedure.   CONSCIOUS SEDATION:  Versed 3 mg IV, Demerol 75 mg IV in divided doses.   INSTRUMENT:  Pentax video chip system.   FINDINGS:  Digital rectal exam revealed no abnormalities.  Endoscopic  Findings:  Prep was good.  Colon:  Colonic mucosa was surveyed from the  rectosigmoid junction through the left transverse, right colon to the  appendiceal orifice, ileocecal valve, and cecum.  These structures were  well seen and photographed for the record.  Terminal ileum was intubated  to 10 cm.  From this level scope was slowly and cautiously withdrawn.  All previously mentioned mucosal surfaces were again seen.  The patient  was noted have a 5-mm polyp in the mid sigmoid, which was cold snared,  recovered.  At the ileocecal valve, there was a flat polypoid lesion in  a semi-lunar distribution.  At the lip of the ileocecal valve, this was  a subtle lesion with peristalsis that totally disappeared site from the  colon side.  This was a sprawling sessile  adenomatous-appearing lesion  that went up into the terminal ileum at least several centimeters.  Please see photos.  This lesion was piecemeal debulked with several  passes with hot snare.  With hot snare, multiple fragments were  recovered for the pathologist.  All of these lesions was not removed and  all of it may not be removable endoscopically.  The remainder of the  colonic mucosa appeared normal.  Stool was pulled down the rectum.  A  thorough examination of the rectal mucosa including retroflexed view of  anal verge demonstrated no abnormalities.  Cecal withdrawal time 22  minutes.  The patient tolerated the procedure well, was reactive in  endoscopy.   IMPRESSION:  1. Normal rectum.  2. Diminutive sigmoid polyp status post snare removal.  3. Flat sessile semilunar lesion at and in the ileocecal valve status      post hot snare piecemeal debulking as described above.   RECOMMENDATIONS:  1. No aspirin or arthritis medications for 5 days.  2. Follow up on path.  3. I suspect the lesion in ileocecal valve  will be best ultimately      treated with surgical resection; however, review the path and make      further recommendations in the very near future.      Jonathon Bellows, M.D.  Electronically Signed     RMR/MEDQ  D:  09/11/2008  T:  09/11/2008  Job:  [id number]   cc:   Delaney Meigs, M.D.  Fax: [phone]

## 2010-10-21 NOTE — Op Note (Signed)
NAMESURYA, FOLDEN             ACCOUNT NO.:  [id number]   MEDICAL RECORD NO.:  [id number]          PATIENT TYPE:  INP   LOCATION:  NA                           FACILITY:  [facility]   PHYSICIAN:  Madlyn Frankel. Charlann Boxer, M.D.  DATE OF BIRTH:  August 16, 1922   DATE OF PROCEDURE:  12/12/2007  DATE OF DISCHARGE:                               OPERATIVE REPORT   PREOPERATIVE DIAGNOSIS:  Right hip osteoarthritis.   POSTOPERATIVE DIAGNOSIS:  Right hip osteoarthritis.   PROCEDURE:  Right total hip replacement.   COMPONENTS USED:  A DePuy hip system size 50 Pinnacle cup, size 4  standard Trilock stem, 32 Marathon polyethylene insert and a 32 +1 metal  ball.   SURGEON:  Madlyn Frankel. Charlann Boxer, M.D.   ASSISTANT:  Yetta Glassman. Mann, PA.   ANESTHESIA:  General.   BLOOD LOSS:  150 mL.   COMPLICATIONS:  None.   INDICATIONS FOR PROCEDURE:  Ms. Fuhr is an 77 year old female who  presented to the office for some evaluation of hip pain.  Radiographs  revealed end-stage right hip change with pretty significant loss of  joint space and some medial wall osteophytes was lateralization of  femoral head.   Given her persistent discomfort and the duration of her symptoms and the  nature of them, she wished to proceed with surgical intervention.  We  reviewed the risks and benefits and consent was obtained.  She obtained  medical clearance.   PROCEDURE IN DETAIL:  The patient was brought to the operative theater.  Once adequate anesthesia, preoperative antibiotics, Ancef, administered;  the patient was positioned in the left lateral decubitus position with  the right side up.  Right lower extremity was then prescrubbed and  prepped and draped in sterile fashion.  The lateral based incision was  made for posterior approach to the hip, the iliotibial band and gluteal  fascia were incised posteriorly.  The short external rotators were  identified and taken down separated from the posterior capsule.  I  elevated the  gluteus minimus and performed an L capsulotomy preserving  the posterior leaflet for later repair as well as protection of the  sciatic nerve from retractors.  Hip was dislocated.  The neck osteotomy  was made based off the anatomic landmarks utilizing a standard offset  neck with a 32 ball.   Attention was first directed to the femur.  Using a box osteotome,  assured lateralization, then using initiator, then using a hand reamer  once to prevent fat emboli, irrigated the femur.  I broached with a 0  broach setting anteversion at 20-25 degrees and then broached up to a  size 4.  This sat about a couple of millimeters below my neck cut so I  used a calcar planer as I had good torsional control.   We packed off the femur, now attended to the acetabulum.  Acetabular  retractors placed and removed the labrum.  I began reaming with a 44  reamer.  There was a lot of sclerotic bone anterior consistent with the  amount of arthritis.  She had reamed up to a 49 reamer  with good bony  bed preparation.  I subsequently impacted a 50 mm Pinnacle cup at 35-40  degrees of abduction, 20 mm of forward flexion, anatomically positioned  between the ischium and anterior wall.  I placed the single cancellus  screws supporting this initial scratch fit.  Based on the position of  the cup and anteversion I had, I went ahead and impacted the nerve,  placed a central hole eliminator and then placed a 32 neutral Marathon  liner.  The final trial reduction now carried out with a 4 broach, a  standard neck and a 32 1 ball.  With this, the combined anteversion was  45-50 degrees, the hip stability was very good with internal rotation  and neutral abduction and hip flexion.  There is only about a millimeter  of shuck with this.  Leg lengths appear to be comparable to the down leg  when I compared it preoperatively.   The trial components were subsequently removed and the final 4 standard  stem was opened and impacted  to the level of the neck cut, the 32 1 ball  was then impacted onto a clean and dry trunnion.  The hip reduced.  We  irrigated the hip throughout the case and again at this point.  There  was no seen hemostasis and required no drain used.  I reapproximated the  posterior leaflet to the superior leaflet using #1 Ethibond.  The  iliotibial band and gluteal fascia were closed using #1 Vicryl.  The  remainder of the wound was closed with 2-0 Vicryl and a running 4-0  Monocryl.  Hip was cleaned, dried and dressed sterilely with Steri-  Strips and sterile dressing.  She was brought to the recovery room,  extubated, tolerated the procedure well.      Madlyn Frankel Charlann Boxer, M.D.  Electronically Signed     MDO/MEDQ  D:  12/12/2007  T:  12/12/2007  Job:  [id number]

## 2010-10-24 NOTE — H&P (Signed)
Sierra Good, Sierra Good             ACCOUNT NO.:  [id number]   MEDICAL RECORD NO.:  [id number]          PATIENT TYPE:  AMB   LOCATION:  DAY                           FACILITY:  [facility]   PHYSICIAN:  J. Darreld Mclean, M.D. DATE OF BIRTH:  02/20/1923   DATE OF ADMISSION:  DATE OF DISCHARGE:  LH                                HISTORY & PHYSICAL   CHIEF COMPLAINT:  My hand and thumb are numb.   The patient is an 77 year old right handed female with complaints of pain  and tenderness and numbness in her hand, right greater than left, for the  last several years, worse the last four to six months. She denies any  trauma. She has worn night splints. She has taken medications. She has done  other things to help make her hands be better, and it has not helped. She  complains of weakness in the right hands with difficulty holding things and  opening jars. She has numbness at night. She denies any trauma. She has pain  running up her arm. She underwent EMGs, nerve conduction velocity showing  abnormal study with left median nerve mononeuropathy consistent with mild  left carpal tunnel and right median nerve mononeuropathy consistent a  moderate right carpal tunnel. These were done by Dr. Santiago Glad and Dr.  Chriss Driver office on September 29, 2005. I informed her of the findings, and she  has elected to undergo carpal tunnel surgery at this time. Risks and  imponderables of the procedure has been discussed with the patient in  detail, and she appears to understand.   PAST HISTORY:  Positive for hypertension.   ALLERGIES:  She has no allergies.   MEDICATIONS:  1.  She has taken Lozol 2.5 mg daily.  2.  Lipitor 10 mg daily.  3.  Protonix 40 mg daily.  4.  Aspirin 81 mg daily.  5.  Calcium supplements daily.   SOCIAL HISTORY:  She does not smoke. She does not use alcohol. Dr. Lysbeth Galas is  her family doctor.   PAST SURGICAL HISTORY:  1.  Bunion surgery.  2.  Hammertoe surgery.   FAMILY HISTORY:   She denies any diseases that run in the family. The patient  lives in [place] and is a widow.   PHYSICAL EXAMINATION:  VITAL SIGNS:  BP is 140/72, pulse 72, respirations  16, afebrile. Height 5 foot 5, 134.  GENERAL:  Alert, cooperative, oriented.  HEENT:  Negative.  NECK:  Supple.  LUNGS:  Clear to P&A.  HEART:  Regular rhythm without murmur heard.  ABDOMEN:  Soft, nontender without masses.  EXTREMITIES:  Positive Phalen's. Positive Tinel's bilaterally. Decreased  grip strength. Upper extremities are negative.  CENTRAL NERVOUS SYSTEM:  Intact except as noted.  SKIN:  Intact.   IMPRESSION:  Bilateral carpal tunnel syndrome, worse on the right.   PLAN:  Release of carpal tunnel volar carpal ligament on the right. Risks  and imponderables of the procedure have been discussed. The labs are  pending. This will be an outpatient surgery.  ______________________________  [name]. Darreld Mclean, M.D.     JWK/MEDQ  D:  10/19/2005  T:  10/19/2005  Job:  [id number]

## 2010-10-24 NOTE — Op Note (Signed)
NAME:  Sierra Good, Sierra Good                       ACCOUNT NO.:  [id number]   MEDICAL RECORD NO.:  [id number]                   PATIENT TYPE:  AMB   LOCATION:  DAY                                  FACILITY:  [facility]   PHYSICIAN:  R. Roetta Sessions, M.D.              DATE OF BIRTH:  04/04/1923   DATE OF PROCEDURE:  09/28/2002  DATE OF DISCHARGE:                                 OPERATIVE REPORT   PROCEDURE:  Screening colonoscopy.   INDICATIONS FOR PROCEDURE:  The patient is a 77 year old lady referred for  colorectal cancer screening.  She is devoid of any lower GI tract symptoms,  aside from intermittent constipation.  She has a positive family history of  colon cancer in that her mother in her 61s was diagnosed with colorectal  cancer.  She has never had her lower GI tract imaged previously.  Colonoscopy is now being done.  This approach has been discussed with the  patient at length.  The potential risks, benefits, and alternatives have  been reviewed and questions answered.  Please see my handwritten H&P for  more information.   PROCEDURE:  The patient was placed in the left lateral decubitus position.  O2 saturation, blood pressure, pulses, and respirations were monitored  throughout the entire procedure.  Conscious sedation was with Versed 3 mg  IV, Demerol 50 mg IV in divided doses.  The patient received Atropine 0.25%  mg IV for asymptomatic bradycardia in the 40s during the initial phase of  the procedure.  The instrument used was the Olympus video chip pediatric  colonoscope.   FINDINGS:  Digital rectal examination revealed no abnormalities.   ENDOSCOPIC FINDINGS:  The prep was good.   Rectum:  Examination of the rectal mucosa including retroflex view of the  anal verge revealed only no abnormalities.   Colon:  The colonic mucosa was surveyed from the rectosigmoid junction  through the left, transverse, right colon to the area of the appendiceal  orifice, ileocecal valve,  and cecum.  These structures were well-seen and  photographed for the record.  The colonic mucosa to the cecum appeared  normal.  The colon was redundant and elongated; however, it was otherwise  normal in appearance.  From the level of the cecum and ileocecal valve, the  scope was slowly withdrawn.  All previously mentioned mucosal surfaces were  again seen.  No other abnormalities were observed.  The patient tolerated  the procedure well and was reactive in endoscopy.   IMPRESSION:  1. Normal rectum.  2. Normal colonic mucosa.  Redundant and elongated but otherwise normal-     appearing colon.    RECOMMENDATIONS:  Repeat colonoscopy in five years if the patient remains in  good health.  She is to follow up with Dr. Dewaine Conger.  Jonathon Bellows, M.D.    RMR/MEDQ  D:  09/28/2002  T:  09/28/2002  Job:  [id number]   cc:   Colon Flattery, MD  [address]  [place]  [place] [address]  Fax: [phone]

## 2010-10-24 NOTE — Procedures (Signed)
NAMEANNELIE, BOAK             ACCOUNT NO.:  [id number]   MEDICAL RECORD NO.:  [id number]          PATIENT TYPE:  OUT   LOCATION:  RAD                           FACILITY:  [facility]   PHYSICIAN:  Gerrit Friends. Dietrich Pates, MD, FACCDATE OF BIRTH:  Apr 02, 1923   DATE OF PROCEDURE:  07/07/2006  DATE OF DISCHARGE:  07/07/2006                                ECHOCARDIOGRAM   REFERRING:  Dr. Lysbeth Galas.   CLINICAL DATA:  An 77 year old woman with syncope and hypertension.   M-MODE:  Aorta 3.1, left atrium 3.7, septum 1.4, posterior wall 1.2, LV  diastole 3.6, LV systole 2.3.   1. Technically adequate echocardiographic study.  2. Normal left and right atrial size; normal right ventricular size      and function; moderate RVH.  3. Slight thickening of the leaflets of a trileaflet aortic valve;      normal aortic root.  4. Normal mitral valve with trivial regurgitation; mild annular      calcification; chordal systolic anterior motion, a normal variant.  5. Normal tricuspid valve; physiologic regurgitation; normal estimated      RV systolic pressure.  6. Normal pulmonic valve and proximal pulmonary artery.  7. Normal IVC.  8. Normal left ventricular size; mild hypertrophy with      disproportionate thickening of the upper septum; normal to      hyperdynamic regional and global function.      Gerrit Friends. Dietrich Pates, MD, [facility]  Electronically Signed     RMR/MEDQ  D:  07/10/2006  T:  07/10/2006  Job:  [id number]

## 2010-10-24 NOTE — Op Note (Signed)
Sierra Good, Sierra Good             ACCOUNT NO.:  [id number]   MEDICAL RECORD NO.:  [id number]          PATIENT TYPE:  AMB   LOCATION:  DAY                           FACILITY:  [facility]   PHYSICIAN:  J. Darreld Mclean, M.D. DATE OF BIRTH:  11/03/1922   DATE OF PROCEDURE:  DATE OF DISCHARGE:                                 OPERATIVE REPORT   PREOPERATIVE DIAGNOSIS:  Carpal tunnel syndrome on the right.   POSTOPERATIVE DIAGNOSIS:  Carpal tunnel syndrome on the right.   PROCEDURE:  Release of volar carpal ligament, saline neurolysis,  epineurotomy, right median nerve.   ANESTHESIA:  Bier block.   TOURNIQUET TIME:  24 minutes.   DRAINS:  None.   SURGEON:  J. Darreld Mclean, M.D.   SPLINTS:  Volar plaster splint applied at the end of the procedure.   INDICATIONS FOR PROCEDURE:  The patient is an 77 year old female with marked  pain and tenderness in both hands, right greater than left, with positive  Phalen's, positive Tinel's in both wrists.  She has atrophy of the thenar  area, more on the right than on the left.  EMGs revealed carpal tunnel  syndrome, worse on the right than on the left.  She is dropping objects.  She has not improved with conservative treatments.  Surgery is recommended.  The risks and imponderables of the procedure were discussed preoperatively  and she understood and agreed to the procedure as outlined.   DESCRIPTION OF PROCEDURE:  The patient was seen in the holding area.  The  right hand was identified as the correct surgical site.  I placed a mark  over the right volar wrist, and she placed a mark.  She was brought to the  operating room and placed supine.  We used Bier block anesthesia.  She was  prepped and draped in the usual manner.  We had a time out.  We identified  [name] as the patient.  The carpal tunnel on the right was the correct  surgical site.  An outline for the incision was made over the area.   With careful dissection, the median nerve  was identified proximally.  A  vessel loop was placed around the nerve.  Using a groove director, this was  placed in the carpal tunnel space.  The incision was extended through the  carpal tunnel volar carpal ligament.  The area was markedly compressed.  As  stated earlier, she has atrophy of the thenar area.  Saline neurolysis and  epineurotomy were carried out.  Proximally, the retinaculum was cut.  The  area was inspected, and there was no apparent injury.  The vessel loop was  removed.  The wound was reapproximated using 3-0 nylon in interrupted  vertical mattress manner.  A sterile dressing was applied.  A bulky dressing  was applied.  Sheet cut and applied.  Sheet cotton was cut dorsally.  Volar  plaster splint applied.  Ace bandage applied loosely.   The patient tolerated the procedure well and went to recovery in good  condition.  Prescription for Darvocet-N 100 given for pain.  I will  see her  in the office in approximately 10 days to 2 weeks.  For any difficulties,  she is to contact me through the office or hospital beeper system.           ______________________________  [name]. Darreld Mclean, M.D.     JWK/MEDQ  D:  10/20/2005  T:  10/20/2005  Job:  [id number]

## 2010-10-24 NOTE — H&P (Signed)
NAME:  Sierra Good, Sierra Good                       ACCOUNT NO.:  [id number]   MEDICAL RECORD NO.:  [id number]                   PATIENT TYPE:  INP   LOCATION:  6531                                 FACILITY:  [facility]   PHYSICIAN:  [place] Bing, M.D.               DATE OF BIRTH:  [birth year]   DATE OF ADMISSION:  10/08/2003  DATE OF DISCHARGE:                                HISTORY & PHYSICAL   REFERRING PHYSICIAN:  Dr. Colon Flattery.   HISTORY OF PRESENT ILLNESS:  This is a 77 year old woman, transferred from  Dr. Karmen Stabs office by EMS after presenting with a remote episode of  syncope, but with continuing dizziness.  Sierra Good has no cardiovascular  history; in fact, she has enjoyed generally excellent health.  She is  extremely active including maintaining her 1+ acre yard and laying concrete  blocks for a patio without any cardiopulmonary symptoms.  She does have  hypertension, which has been controlled with a diuretic and hyperlipidemia  treated with a statin drug.  She was well until two weeks ago when she noted  dizziness after arising from bed and heading for the kitchen.  She sat on a  chair and then found herself on the floor.  The duration of loss of  consciousness is uncertain.  She did sustain minor injuries of her left knee  and head with bruising.  She felt generally poor for a number of hours  thereafter, but did not seek medical attention, hoping that her medical  symptoms would resolve.  Since then, on a nearly daily basis and always in  the morning, she notes dizziness.  This is not as extreme as it was on that  day, but similar in quality.  There appears to be an orthostatic component,  but no relationship to movement of the head.  There are no true vertigo  symptoms.  She feels better most afternoons.  She has had no dyspnea, chest  discomfort, nor headache.  There is no history of anemia or B12 deficiency.  She has never received B12 injections.  She maintains a  normal diet, but  recently started a low-fat diet and has lost approximately 10 pounds.   PAST MEDICAL HISTORY:  Otherwise unremarkable.   MEDICATIONS ON ADMISSION:  1. Lozol one q.d.  2. Atorvastatin 10 mg q.d.  3. Aspirin 81 mg q.d.  4. Calcium supplement.  5. Multivitamin.   SOCIAL HISTORY:  She lives alone in [place], retired from previous  employment with [employer].  No tobacco nor alcohol use.  Widowed with two  daughters and one son.   FAMILY HISTORY:  Mother died from carcinoma of the colon; father suffered a  fatal CVA.  No prominent history of coronary disease.  Mother may have had  atrial fibrillation.   REVIEW OF SYSTEMS:  Recent weight loss as described above; requires  corrective lenses; postmenopausal; occasional arthralgias of her back with  some  previous visits to the chiropractor; occasional constipation.  All  other systems are reviewed and are negative.   PHYSICAL EXAMINATION:  GENERAL:  Very pleasant, trim woman in no acute  distress.  VITAL SIGNS:  Temperature 97.2; heart rate 50 and regular; respirations 20;  blood pressure 190/85.  HEENT:  Anicteric sclerae; pupils equal, round and reactive to light.  NECK:  No jugulovenous distension; no carotid bruits.  ENDOCRINE:  No thyromegaly.  HEMATOPOIETIC:  No adenopathy.  SKIN:  No significant lesions.  LUNGS:  Clear.  CARDIAC:  Prominent fourth heart sounds; normal first and second heart  sounds.  ABDOMEN:  Soft and nontender; no organomegaly.  EXTREMITIES:  1+ distal pulses; no edema.  NEUROMUSCULAR:  Symmetric strength and tone; slightly awkward gait with  minimal unsteadiness; Romberg is slightly unsteady; cerebellar function  appears normal.  No cranial nerve abnormalities.  Normal strength and tone.  MUSCULOSKELETAL:  No joint deformities.   EKG:  Sinus bradycardia; otherwise within normal limits.   IMPRESSION:  Sierra Good presents with an episode of syncope two weeks ago  and subsequent  dizziness.  It is not clear that her symptoms are  cardiovascular in etiology.  An echocardiogram will be obtained to rule out  cardiac dysfunction.  There is no evidence on exam for significant valvular  heart disease.  A D-Dimer level will be obtained, but thromboembolic disease  appears very unlikely.  A CT scan of the head will also be performed today.  We will maintain continuous electrocardiographic monitoring and check  orthostatic vital signs.  If none of that is productive, and she develops  symptoms in the absence of a significant arrhythmia, neurologic consultation  will be requested, as she does have a component of sick sinus syndrome, but  has not manifested any significant arrhythmias so far.   If she is not orthostatic, her antihypertensive regimen will be adjusted  appropriately.                                                Rockland Bing, M.D.    RR/MEDQ  D:  10/08/2003  T:  10/08/2003  Job:  [id number]

## 2010-10-24 NOTE — Discharge Summary (Signed)
NAME:  Sierra Good, Sierra Good                       ACCOUNT NO.:  [id number]   MEDICAL RECORD NO.:  [id number]                   PATIENT TYPE:  INP   LOCATION:  6531                                 FACILITY:  [facility]   PHYSICIAN:  Churchill Bing, M.D.               DATE OF BIRTH:  1922-06-28   DATE OF ADMISSION:  10/08/2003  DATE OF DISCHARGE:  10/09/2003                                 DISCHARGE SUMMARY   DISCHARGE DIAGNOSES:  1. Syncope/near syncope.  2. Sinus bradycardia.  3. Hypertension.  4. Dyslipidemia.  5. Normal left ventricular function.   HOSPITAL COURSE:  Please see the admission history and physical for complete  details. Briefly, this 77 year old female was transferred from her family  physician's office on the date of admission, Oct 08, 2003 to [facility]  emergency room. She had had one episode of probable syncope one month prior  to admission. Since that time, she had been noticing decreased energy and  recurring symptoms in the morning; however, she had had no recurrent syncope  since the initial episode. She was admitted. Her chest x-ray showed no acute  disease. Two-D echocardiogram revealed EF of 55 to 65% and mild LVH and mild  mitral regurgitation. Her D-dimer was negative at 0.23. Her TSH was normal  at 1.818. She had mild orthostasis with blood pressure 161/66 lying,  dropping to 142/64 standing. Her head CT showed mild atrophy, small vessel  disease changes. No hemorrhage. No large infarct. On the morning of Oct 09, 2003, orthostatic vital signs were rechecked. At this time, she had no  orthostatic blood pressure drop. Her telemetry did review sinus bradycardia  with heart rates in the 40s to 50s. It was decided that she could be  discharged to home. Further workup as an outpatient will include stress  Cardiolite as well as an event recording. She will follow up in our  [place] office with Dr. Dietrich Pates. Her previous medications were continued  except her  Lozol was changed to chlorthalidone half a tablet a day.   LABORATORY DATA:  White count 8,800, hemoglobin 15.3, hematocrit 44.8,  platelet count 249,000. INR 0.9. D-dimer 0.23. Sodium 138, potassium 3.6,  BUN 17, creatinine 1.1, glucose 105. Total protein 6.6, albumin 3.8, AST 28,  ALT 22, alkaline phosphatase 70, total bilirubin 0.7. Cardiac enzymes  negative x1. Total cholesterol 165, triglycerides 112, HDL 50, LDL 93. TSH  1.818. Chest x-ray, head CT, and 2-D echocardiogram all as noted above.   DISCHARGE MEDICATIONS:  1. Lipitor 10 mg q.h.s.  2. Aspirin 81 mg daily.  3. Calcium.  4. Multivitamin.  5. Chlorthalidone half a tablet a day.   ACTIVITY:  As tolerated. The patient was advised to do no driving when  symptomatic.   DIET:  Low fat, low sodium.   FOLLOW UP:  The patient was asked to see her primary care physician with in  a week. She would  follow up with Dr. Dietrich Pates in one month's time. In the  interim, she would be set up for an outpatient stress Cardiolite as well as  an event recorder.      Tereso Newcomer, P.A.                        [place] Bing, M.D.    SW/MEDQ  D:  12/03/2003  T:  12/03/2003  Job:  [id number]   cc:   Colon Flattery, D.O.  [address]  [place]  [place] [address]  Fax: [phone]

## 2010-10-24 NOTE — Procedures (Signed)
NAME:  Sierra Good, Sierra Good                       ACCOUNT NO.:  [id number]   MEDICAL RECORD NO.:  [id number]                  PATIENT TYPE:  PREC   LOCATION:                                       FACILITY:   PHYSICIAN:  Vida Roller, M.D.                DATE OF BIRTH:  [birth year]   DATE OF PROCEDURE:  10/22/2003  DATE OF DISCHARGE:                                    STRESS TEST   INDICATION:  Sierra Good is a 77 year old female who was recently admitted to  [facility] for evaluation of syncopal episodes and dizziness.  She  has no prior cardiac history.  Cardiac risk factors include hypertension and  hyperlipidemia.  She denies any chest discomfort.  She was found to have  sinus bradycardia in the hospital, otherwise, her workup was within normal  limits.   BASELINE DATA:  EKG reveals a sinus bradycardia at a rate of 45 beats per  minute with nonspecific ST abnormalities.  Blood pressure is 110/70.   RESULTS:  Patient exercised for a total of 6 minutes in Bruce protocol stage  1.  This was held and only the speed was increased slightly; this was done  secondary to fatigue.  She did achieve 5.3 METS.  Maximal heart rate was 132  per minute, which is 93% of predicted maximum.  Maximum blood pressure was  198/78.  The patient reported dyspnea, which resolved in recovery.  She did  have some PVCs and PACs during exercise.  She has minimal ST depression in  the inferolateral leads in recovery.  Exercise was stopped secondary to  fatigue.   Final images and results are pending M.D. review.     ________________________________________  ___________________________________________  Jae Dire, P.A. [facility]                      Vida Roller, M.D.   AB/MEDQ  D:  10/22/2003  T:  10/22/2003  Job:  [id number]

## 2010-11-27 ENCOUNTER — Ambulatory Visit (INDEPENDENT_AMBULATORY_CARE_PROVIDER_SITE_OTHER): Payer: Medicare Other | Admitting: Otolaryngology

## 2010-11-27 DIAGNOSIS — J310 Chronic rhinitis: Secondary | ICD-10-CM

## 2010-11-27 DIAGNOSIS — J342 Deviated nasal septum: Secondary | ICD-10-CM

## 2010-11-27 DIAGNOSIS — H6120 Impacted cerumen, unspecified ear: Secondary | ICD-10-CM

## 2010-11-27 DIAGNOSIS — J343 Hypertrophy of nasal turbinates: Secondary | ICD-10-CM

## 2010-12-15 ENCOUNTER — Encounter (HOSPITAL_COMMUNITY)
Admission: RE | Admit: 2010-12-15 | Discharge: 2010-12-15 | Disposition: A | Discharge status: Home / Self Care | Payer: Medicare Other | Source: Ambulatory Visit | Attending: Otolaryngology | Admitting: Otolaryngology

## 2010-12-15 ENCOUNTER — Encounter (HOSPITAL_COMMUNITY): Payer: Self-pay

## 2010-12-15 ENCOUNTER — Other Ambulatory Visit: Payer: Self-pay

## 2010-12-15 HISTORY — DX: Gastro-esophageal reflux disease without esophagitis: K21.9

## 2010-12-15 HISTORY — DX: Other specified postprocedural states: Z98.890

## 2010-12-15 HISTORY — DX: Hypertrophy of nasal turbinates: J34.3

## 2010-12-15 HISTORY — DX: Nausea with vomiting, unspecified: R11.2

## 2010-12-15 HISTORY — DX: Essential (primary) hypertension: I10

## 2010-12-15 HISTORY — DX: Spondylosis without myelopathy or radiculopathy, lumbar region: M47.816

## 2010-12-15 HISTORY — DX: Deviated nasal septum: J34.2

## 2010-12-15 HISTORY — DX: Hyperlipidemia, unspecified: E78.5

## 2010-12-15 LAB — BASIC METABOLIC PANEL
BUN: 25 mg/dL — ABNORMAL HIGH (ref 6–23)
CO2: 28 mEq/L (ref 19–32)
Calcium: 10.1 mg/dL (ref 8.4–10.5)
GFR calc non Af Amer: 48 mL/min — ABNORMAL LOW (ref >60)
Glucose, Bld: 108 mg/dL — ABNORMAL HIGH (ref 70–99)
Sodium: 139 mEq/L (ref 135–145)

## 2010-12-15 LAB — CBC
Hemoglobin: 15.4 g/dL — ABNORMAL HIGH (ref 12.0–15.0)
MCH: 31.5 pg (ref 26.0–34.0)
MCHC: 33.6 g/dL (ref 30.0–36.0)
MCV: 93.7 fL (ref 78.0–100.0)
Platelets: 241 10*3/uL (ref 150–400)
RBC: 4.89 MIL/uL (ref 3.87–5.11)

## 2010-12-15 LAB — SURGICAL PCR SCREEN: MRSA, PCR: NEGATIVE

## 2010-12-15 NOTE — Patient Instructions (Signed)
[address]  12/15/2010   Your procedure is scheduled on:  Thursday  Report to [facility] at 07:40 AM.  Call this number if you have problems the morning of surgery: [phone]   Remember:   Do not eat food:After Midnight.  Do not drink clear liquids: After Midnight.  Take these medicines the morning of surgery with A SIP OF WATER: Amlodipine and Omeprazole   Do not wear jewelry, make-up or nail polish.  Do not bring valuables to the hospital.  Contacts, dentures or bridgework may not be worn into surgery.  Leave suitcase in the car. After surgery it may be brought to your room.  For patients admitted to the hospital, checkout time is 11:00 AM the day of discharge.   Patients discharged the day of surgery will not be allowed to drive home.  Name and phone number of your driver: daughter, Fraser Din  Special Instructions: CHG Shower Shower 2 days before surgery and 1 day before surgery with Hibiclens.   Please read over the following fact sheets that you were given: Pain Booklet, MRSA Information, Surgical Site Infection Prevention and Anesthesia Post-op Instructions     Instructions Following General Anesthetic, Adult A nurse specialized in giving anesthesia (anesthetist) or a doctor specialized in giving anesthesia (anesthesiologist) gave you a medicine that made you sleep while a procedure was performed. For as long as 24 hours following this procedure, you may feel:  Dizzy.   Weak.   Drowsy.  AFTER YOUR SURGERY 1. After surgery, you will be taken to the recovery area where a nurse will monitor your progress. You will be allowed to go home when you are awake, stable, taking fluids well, and without complications.  2.  3. For the first 24 hours following an anesthetic:   Have a responsible person with you.   Do not drive a car. If you are alone, do not take public transportation.   Do not drink alcohol.   Do not take medicine that has not been prescribed by  your caregiver.   Do not sign important papers or make important decisions.   You may resume normal diet and activities as directed.   Change bandages (dressings) as directed.   Only take over-the-counter or prescription medicines for pain, discomfort, or fever as directed by your caregiver.  If you have questions or problems that seem related to the anesthetic, call the hospital and ask for the anesthetist or anesthesiologist on call. SEEK IMMEDIATE MEDICAL CARE IF:  You develop a rash.   You have difficulty breathing.   You have chest pain.   You develop any allergic problems.  Document Released: 08/31/2000 Document Re-Released: 08/19/2009 [facility] Patient Information 2011 [place], [place].

## 2010-12-18 ENCOUNTER — Encounter (HOSPITAL_COMMUNITY): Payer: Self-pay | Admitting: *Deleted

## 2010-12-18 ENCOUNTER — Other Ambulatory Visit (INDEPENDENT_AMBULATORY_CARE_PROVIDER_SITE_OTHER): Payer: Self-pay | Admitting: Otolaryngology

## 2010-12-18 ENCOUNTER — Encounter (HOSPITAL_COMMUNITY): Payer: Self-pay | Admitting: Anesthesiology

## 2010-12-18 ENCOUNTER — Ambulatory Visit (HOSPITAL_COMMUNITY): Payer: Medicare Other | Admitting: Anesthesiology

## 2010-12-18 ENCOUNTER — Encounter (HOSPITAL_COMMUNITY)
Admission: RE | Disposition: A | Discharge status: Home / Self Care | Payer: Self-pay | Source: Ambulatory Visit | Attending: Otolaryngology

## 2010-12-18 ENCOUNTER — Ambulatory Visit (HOSPITAL_COMMUNITY)
Admission: RE | Admit: 2010-12-18 | Discharge: 2010-12-18 | Disposition: A | Discharge status: Home / Self Care | Payer: Medicare Other | Source: Ambulatory Visit | Attending: Otolaryngology | Admitting: Otolaryngology

## 2010-12-18 DIAGNOSIS — J343 Hypertrophy of nasal turbinates: Secondary | ICD-10-CM | POA: Insufficient documentation

## 2010-12-18 DIAGNOSIS — Z01810 Encounter for preprocedural cardiovascular examination: Secondary | ICD-10-CM | POA: Insufficient documentation

## 2010-12-18 DIAGNOSIS — I10 Essential (primary) hypertension: Secondary | ICD-10-CM | POA: Insufficient documentation

## 2010-12-18 DIAGNOSIS — Z79899 Other long term (current) drug therapy: Secondary | ICD-10-CM | POA: Insufficient documentation

## 2010-12-18 DIAGNOSIS — Z01812 Encounter for preprocedural laboratory examination: Secondary | ICD-10-CM | POA: Insufficient documentation

## 2010-12-18 DIAGNOSIS — J342 Deviated nasal septum: Secondary | ICD-10-CM

## 2010-12-18 HISTORY — PX: NASAL SEPTOPLASTY W/ TURBINOPLASTY: SHX2070

## 2010-12-18 SURGERY — SEPTOPLASTY, NOSE, WITH NASAL TURBINATE REDUCTION
Anesthesia: General | Laterality: Bilateral | Wound class: Clean Contaminated

## 2010-12-18 MED ORDER — FENTANYL CITRATE 0.05 MG/ML IJ SOLN
INTRAMUSCULAR | Status: DC | PRN
  Administered 2010-12-18 (×2): 50 ug via INTRAVENOUS

## 2010-12-18 MED ORDER — OXYMETAZOLINE HCL 0.05 % NA SOLN
NASAL | Status: AC
  Filled 2010-12-18: qty 15

## 2010-12-18 MED ORDER — FENTANYL CITRATE 0.05 MG/ML IJ SOLN
INTRAMUSCULAR | Status: AC
  Filled 2010-12-18: qty 2

## 2010-12-18 MED ORDER — LACTATED RINGERS IV SOLN
INTRAVENOUS | Status: DC | PRN
  Administered 2010-12-18: 09:00:00 via INTRAVENOUS

## 2010-12-18 MED ORDER — NON FORMULARY
Status: DC | PRN
  Administered 2010-12-18: 10 mL

## 2010-12-18 MED ORDER — SUCCINYLCHOLINE CHLORIDE 20 MG/ML IJ SOLN
INTRAMUSCULAR | Status: AC
  Filled 2010-12-18: qty 1

## 2010-12-18 MED ORDER — ONDANSETRON HCL 4 MG/2ML IJ SOLN: 4.0000 mg | Freq: Once | INTRAMUSCULAR | Status: DC | PRN

## 2010-12-18 MED ORDER — PHENYLEPHRINE HCL 10 MG/ML IJ SOLN
INTRAMUSCULAR | Status: DC | PRN
  Administered 2010-12-18: 100 ug via INTRAVENOUS

## 2010-12-18 MED ORDER — BACITRACIN ZINC 500 UNIT/GM EX OINT
TOPICAL_OINTMENT | CUTANEOUS | Status: DC | PRN
  Administered 2010-12-18: 1 via TOPICAL

## 2010-12-18 MED ORDER — HYDROCODONE-ACETAMINOPHEN 5-500 MG PO TABS: 1.0000 | ORAL_TABLET | ORAL | Status: AC | PRN

## 2010-12-18 MED ORDER — EPHEDRINE SULFATE 50 MG/ML IJ SOLN
INTRAMUSCULAR | Status: DC | PRN
  Administered 2010-12-18: 10 mg via INTRAVENOUS

## 2010-12-18 MED ORDER — GLYCOPYRROLATE 0.2 MG/ML IJ SOLN
0.2000 mg | Freq: Once | INTRAMUSCULAR | Status: AC
  Administered 2010-12-18: 0.2 mg via INTRAVENOUS

## 2010-12-18 MED ORDER — LIDOCAINE-EPINEPHRINE (PF) 1 %-1:200000 IJ SOLN
INTRAMUSCULAR | Status: DC | PRN
  Administered 2010-12-18: 5 mL

## 2010-12-18 MED ORDER — BACITRACIN ZINC 500 UNIT/GM EX OINT
TOPICAL_OINTMENT | CUTANEOUS | Status: AC
  Filled 2010-12-18: qty 1.8

## 2010-12-18 MED ORDER — MIDAZOLAM HCL 2 MG/2ML IJ SOLN
INTRAMUSCULAR | Status: AC
  Filled 2010-12-18: qty 2

## 2010-12-18 MED ORDER — GLYCOPYRROLATE 0.2 MG/ML IJ SOLN
INTRAMUSCULAR | Status: AC
  Filled 2010-12-18: qty 1

## 2010-12-18 MED ORDER — ONDANSETRON HCL 4 MG/2ML IJ SOLN
INTRAMUSCULAR | Status: AC
  Filled 2010-12-18: qty 2

## 2010-12-18 MED ORDER — LACTATED RINGERS IV SOLN: INTRAVENOUS | Status: DC | PRN

## 2010-12-18 MED ORDER — CEPHALEXIN 500 MG PO CAPS: 500.0000 mg | ORAL_CAPSULE | Freq: Three times a day (TID) | ORAL | Status: AC

## 2010-12-18 MED ORDER — LIDOCAINE-EPINEPHRINE 1 %-1:100000 IJ SOLN
INTRAMUSCULAR | Status: AC
  Filled 2010-12-18: qty 1

## 2010-12-18 MED ORDER — OXYMETAZOLINE HCL 0.05 % NA SOLN
NASAL | Status: DC | PRN
  Administered 2010-12-18: 1 via NASAL

## 2010-12-18 MED ORDER — SUCCINYLCHOLINE CHLORIDE 20 MG/ML IJ SOLN
INTRAMUSCULAR | Status: DC | PRN
  Administered 2010-12-18: 120 mg via INTRAVENOUS

## 2010-12-18 MED ORDER — LIDOCAINE HCL (PF) 1 % IJ SOLN
INTRAMUSCULAR | Status: AC
  Filled 2010-12-18: qty 5

## 2010-12-18 MED ORDER — MIDAZOLAM HCL 2 MG/2ML IJ SOLN
1.0000 mg | INTRAMUSCULAR | Status: DC | PRN
  Administered 2010-12-18: 2 mg via INTRAVENOUS

## 2010-12-18 MED ORDER — ONDANSETRON HCL 4 MG/2ML IJ SOLN
4.0000 mg | Freq: Once | INTRAMUSCULAR | Status: AC
  Administered 2010-12-18: 4 mg via INTRAVENOUS

## 2010-12-18 MED ORDER — ROCURONIUM BROMIDE 50 MG/5ML IV SOLN
INTRAVENOUS | Status: AC
  Filled 2010-12-18: qty 1

## 2010-12-18 MED ORDER — DEXAMETHASONE SODIUM PHOSPHATE 4 MG/ML IJ SOLN
12.0000 mg | Freq: Once | INTRAMUSCULAR | Status: AC
  Administered 2010-12-18: 12 mg via INTRAVENOUS

## 2010-12-18 MED ORDER — LACTATED RINGERS IV SOLN
INTRAVENOUS | Status: DC
  Administered 2010-12-18: 09:00:00 via INTRAVENOUS
  Filled 2010-12-18: qty 1000

## 2010-12-18 MED ORDER — FENTANYL CITRATE 0.05 MG/ML IJ SOLN: 25.0000 ug | INTRAMUSCULAR | Status: DC | PRN

## 2010-12-18 MED ORDER — LACTATED RINGERS IV SOLN: INTRAVENOUS | Status: DC

## 2010-12-18 MED ORDER — PROPOFOL 10 MG/ML IV EMUL
INTRAVENOUS | Status: AC
  Filled 2010-12-18: qty 20

## 2010-12-18 MED ORDER — DEXAMETHASONE SODIUM PHOSPHATE 4 MG/ML IJ SOLN
INTRAMUSCULAR | Status: AC
  Filled 2010-12-18: qty 3

## 2010-12-18 MED ORDER — EPHEDRINE SULFATE 50 MG/ML IJ SOLN
INTRAMUSCULAR | Status: AC
  Filled 2010-12-18: qty 1

## 2010-12-18 MED ORDER — PROPOFOL 10 MG/ML IV EMUL
INTRAVENOUS | Status: DC | PRN
  Administered 2010-12-18 (×2): 50 mg via INTRAVENOUS

## 2010-12-18 MED ORDER — PHENYLEPHRINE HCL 10 MG/ML IJ SOLN
INTRAMUSCULAR | Status: AC
  Filled 2010-12-18: qty 1

## 2010-12-18 MED ORDER — ROCURONIUM BROMIDE 100 MG/10ML IV SOLN
INTRAVENOUS | Status: DC | PRN
  Administered 2010-12-18: 5 mg via INTRAVENOUS

## 2010-12-18 SURGICAL SUPPLY — 33 items
BAG HAMPER (MISCELLANEOUS) ×2 IMPLANT
BLADE SURG 15 STRL LF DISP TIS (BLADE) ×1 IMPLANT
BLADE SURG 15 STRL SS (BLADE) ×1
CLOTH BEACON ORANGE TIMEOUT ST (SAFETY) ×2 IMPLANT
COAGULATOR SUCT SWTCH 10FR 6 (ELECTROSURGICAL) ×2 IMPLANT
ELECT REM PT RETURN 9FT ADLT (ELECTROSURGICAL) ×2
ELECTRODE REM PT RTRN 9FT ADLT (ELECTROSURGICAL) ×1 IMPLANT
GLOVE BIO SURGEON STRL SZ7.5 (GLOVE) ×2 IMPLANT
GLOVE ECLIPSE 7.0 STRL STRAW (GLOVE) ×4 IMPLANT
GLOVE INDICATOR 7.0 STRL GRN (GLOVE) ×2 IMPLANT
GLOVE INDICATOR 7.5 STRL GRN (GLOVE) ×2 IMPLANT
GOWN BRE IMP SLV AUR XL STRL (GOWN DISPOSABLE) ×6 IMPLANT
HEMOSTAT SURGICEL 4X8 (HEMOSTASIS) IMPLANT
KIT BLADEGUARD II DBL (SET/KITS/TRAYS/PACK) ×2 IMPLANT
KIT ROOM TURNOVER APOR (KITS) ×2 IMPLANT
MARKER SKIN DUAL TIP RULER LAB (MISCELLANEOUS) ×2 IMPLANT
NEEDLE HYPO 18GX1.5 BLUNT FILL (NEEDLE) ×2 IMPLANT
NEEDLE HYPO 25X1 1.5 SAFETY (NEEDLE) ×2 IMPLANT
NS IRRIG 1000ML POUR BTL (IV SOLUTION) ×2 IMPLANT
PAD ARMBOARD 7.5X6 YLW CONV (MISCELLANEOUS) ×2 IMPLANT
PAD TELFA 3X4 1S STER (GAUZE/BANDAGES/DRESSINGS) ×2 IMPLANT
PENCIL FOOT CONTROL (ELECTRODE) ×2 IMPLANT
SET BASIN LINEN APH (SET/KITS/TRAYS/PACK) ×2 IMPLANT
SOLUTION ANTI FOG 6CC (MISCELLANEOUS) IMPLANT
SOLUTION BUTLER CLEAR DIP (MISCELLANEOUS) ×2 IMPLANT
SPLINT INTRANASAL AIRWAY DYL 2 (MISCELLANEOUS) ×2 IMPLANT
SPONGE NEURO XRAY DETECT 1X3 (DISPOSABLE) ×2 IMPLANT
SUT CHROMIC 4 0 P 3 18 (SUTURE) ×2 IMPLANT
SUT PLAIN 4 0 ~~LOC~~ 1 (SUTURE) ×2 IMPLANT
SUT PROLENE 2 0 FS (SUTURE) ×2 IMPLANT
SUT PROLENE 3 0 PS 2 (SUTURE) IMPLANT
TRAY ENT MC OR (CUSTOM PROCEDURE TRAY) ×2 IMPLANT
YANKAUER SUCT 12FT TUBE ARGYLE (SUCTIONS) ×4 IMPLANT

## 2010-12-18 NOTE — Anesthesia Procedure Notes (Addendum)
Performed by: Glynn Octave    Procedure Name: Intubation Date/Time: 12/18/2010 10:10 AM Performed by: Glynn Octave Pre-anesthesia Checklist: Patient identified, Patient being monitored, Timeout performed, Emergency Drugs available and Suction available Patient Re-evaluated:Patient Re-evaluated prior to inductionOxygen Delivery Method: Circle System Utilized Preoxygenation: Pre-oxygenation with 100% oxygen Intubation Type: IV induction Ventilation: Mask ventilation without difficulty Laryngoscope Size: Mac and 3 Grade View: Grade I Tube type: Oral Tube size: 7.0 mm Number of attempts: 1 Airway Equipment and Method: stylet Placement Confirmation: ETT inserted through vocal cords under direct vision,  positive ETCO2 and breath sounds checked- equal and bilateral Secured at: 21 cm Tube secured with: Tape Dental Injury: Teeth and Oropharynx as per pre-operative assessment

## 2010-12-18 NOTE — Transfer of Care (Signed)
Immediate Anesthesia Transfer of Care Note  Patient: Sierra Good  Procedure(s) Performed:  NASAL SEPTOPLASTY WITH TURBINATE REDUCTION  Patient Location: PACU  Anesthesia Type: MAC  Level of Consciousness: awake, alert  and oriented  Airway & Oxygen Therapy: Patient Spontanous Breathing and Patient connected to face mask oxygen  Post-op Assessment: Report given to PACU RN and Post -op Vital signs reviewed and stable  Post vital signs: stable  Complications: No apparent anesthesia complications

## 2010-12-18 NOTE — Progress Notes (Signed)
H&P Update  No change in health status. Pt's initial H&P done on 11/27/10. Exam unchanged. Pt ready for septoplasty and bilateral partial inferior turbinate resection.

## 2010-12-18 NOTE — Anesthesia Preprocedure Evaluation (Addendum)
Anesthesia Evaluation  Name, MR# and DOB Patient awake  General Assessment Comment  Reviewed: Allergy & Precautions, H&P  and Patient's Chart, lab work & pertinent test results  History of Anesthesia Complications (+) PONV  Airway Mallampati: II  Neck ROM: Full    Dental  (+) Teeth Intact   Pulmonary    pulmonary exam normal   Cardiovascular hypertension, + dysrhythmias (  controlled rate no syncope, no CP) Atrial Fibrillation Irregular Normal   Neuro/Psych  GI/Hepatic/Renal (+)  GERD Medicated and Controlled     Endo/Other   Abdominal   Musculoskeletal  Hematology   Peds  Reproductive/Obstetrics   Anesthesia Other Findings             Anesthesia Physical Anesthesia Plan  ASA: III  Anesthesia Plan: General   Post-op Pain Management:    Induction: Intravenous  Airway Management Planned: Oral ETT  Additional Equipment:   Intra-op Plan:   Post-operative Plan: Extubation in OR  Informed Consent: I have reviewed the patients History and Physical, chart, labs and discussed the procedure including the risks, benefits and alternatives for the proposed anesthesia with the patient or authorized representative who has indicated his/her understanding and acceptance.     Plan Discussed with:   Anesthesia Plan Comments:         Anesthesia Quick Evaluation

## 2010-12-18 NOTE — Op Note (Signed)
Sierra Good, Sierra Good             ACCOUNT NO.:  [id number]  MEDICAL RECORD NO.:  [id number]  LOCATION:  [facility]                          FACILITY:  [facility]  PHYSICIAN:  [name], MD            DATE OF BIRTH:  1922/09/18  DATE OF PROCEDURE:  12/18/2010 DATE OF DISCHARGE:  12/18/2010                              OPERATIVE REPORT   SURGEON:  [name], MD  PREOPERATIVE DIAGNOSES: 1. Bidirectional nasal septal deviation. 2. Bilateral inferior turbinate hypertrophy.  POSTOPERATIVE DIAGNOSES: 1. Bidirectional nasal septal deviation. 2. Bilateral inferior turbinate hypertrophy.  PROCEDURE PERFORMED: 1. Septoplasty. 2. Bilateral partial inferior turbinate resection.  ANESTHESIA:  General endotracheal tube anesthesia.  COMPLICATIONS:  None.  ESTIMATED BLOOD LOSS:  Less than 100 mL.  INDICATIONS FOR PROCEDURE:  The patient is an 77 year old female with a history of bilateral chronic nasal obstruction.  She was previously treated with steroid nasal spray and decongestant without significant improvement in her symptoms.  On examination, the patient was noted to have severe bidirectional nasal septal deviation and bilateral inferior turbinate hypertrophy.  Based on the above findings, the decision was made for the patient to undergo the above-stated procedures.  The risks, benefits, alternatives, and details of the procedures were discussed with the patient.  Questions were invited and answered.  Informed consent was obtained.  DESCRIPTION OF PROCEDURE:  The patient was taken to the operating room and placed supine on the operating table.  General endotracheal tube anesthesia was administered by the anesthesiologist.  The patient was positioned and prepped and draped in a standard fashion for nasal surgery.  Pledgets soaked with Afrin were placed in both nasal cavities for vasoconstriction.  The pledgets were subsequently removed. Examination of the nasal cavities revealed bidirectional  nasal septal deviation with bilateral inferior turbinate hypertrophy.  Nearly 100% of the right nasal cavity was obstructed.  The left nasal cavity was more than 90% obstructed.  Lidocaine 1% with 1:100,000 epinephrine was injected onto the nasal septum bilaterally.  A standard hemitransfixion incision was made on the left side.  The mucosal flap was elevated on the left side in a standard fashion.  A cartilaginous incision was made 1 cm superior to the caudal margin of the nasal septum.  The mucosal flap was elevated on the contralateral side in a standard fashion.  It should be noted that a significant portion of the right nasal septum was covered with friable and edematous soft tissue.  The anterior one-third of the right inferior turbinate was also covered with the similar friable soft tissue.  Approximately 6 biopsy specimens were made from the friable mucosal surface and sent to the Pathology Department for permanent histologic identification.  The deviated portion of the cartilaginous and bony nasal septum were subsequently resected.  The cartilage was morselized and replaced.  The septum was quilted with through-and-through 5-0 plain gut sutures.  The hemitransfixion incision was closed with interrupted chromic sutures.  Attention was then focused on the inferior turbinates.  The inferior one- half of each inferior turbinates were cross clamped with a straight Kelly clamp.  The inferior one-half of each inferior turbinate was then resected with a pair of  cross-cutting scissors.  Hemostasis was achieved with the suction electrocautery device.  The surgical sites were copiously irrigated.  Doyle splints were applied to both sides of the nasal septum.  That concluded procedure for the patient.  The care of the patient was turned over to the anesthesiologist.  The patient was awakened from anesthesia without difficulty.  She was extubated and transferred to the recovery room in good  condition.  OPERATIVE FINDINGS: 1. Bidirectional nasal septal deviation. 2. Bilateral inferior turbinate hypertrophy. 3. A large amount of friable soft tissue was noted to cover the right     nasal septum and the inferior one-third of the right inferior     turbinate.  Biopsy specimens were obtained from the friable mucosa     and sent to the Pathology Department for permanent histologic     identification.  SPECIMEN:  Right soft tissue biopsy.  FOLLOWUP CARE:  The patient will be discharged home once she is awake and alert.  She will be placed on Vicodin 1 tablet p.o. q.4 h. p.r.n. pain, and Keflex 500 mg p.o. t.i.d. for 5 days.  The patient will follow up in my office in approximately 1 week.     [name], MD     ST/MEDQ  D:  12/18/2010  T:  12/18/2010  Job:  [id number]

## 2010-12-18 NOTE — Brief Op Note (Signed)
12/18/2010  11:31 AM  PATIENT:  Peace M Kamp  77 y.o. female  PRE-OPERATIVE DIAGNOSIS:  nasal septal deviation, bilateral turbinate hypertrophy  POST-OPERATIVE DIAGNOSIS:  nasal septal deviation, bilateral turbinate hypertrophy  PROCEDURE:  Procedure(s): 1) NASAL SEPTOPLASTY 2) Bilateral partial inferior turbinate resection  SURGEON:  Surgeon(s): Sui W Koya Hunger  PHYSICIAN ASSISTANT:   ASSISTANTS: none   ANESTHESIA:   general  ESTIMATED BLOOD LOSS: less than  BLOOD ADMINISTERED:none  DRAINS: none   LOCAL MEDICATIONS USED:  LIDOCAINE 5CC  SPECIMEN:  Biopsy / Limited Resection  DISPOSITION OF SPECIMEN:  PATHOLOGY  COUNTS:  YES  TOURNIQUET:  None  DICTATION #: [id number]  PLAN OF CARE: Discharge to home.  PATIENT DISPOSITION:  PACU - hemodynamically stable.   Delay start of Pharmacological VTE agent (>24hrs) due to surgical blood loss or risk of bleeding:  not applicable

## 2010-12-18 NOTE — Anesthesia Postprocedure Evaluation (Signed)
  Anesthesia Post-op Note  Patient: Sierra Good  Procedure(s) Performed:  NASAL SEPTOPLASTY WITH TURBINATE REDUCTION  Patient Location: PACU  Anesthesia Type: General  Level of Consciousness: awake  Airway and Oxygen Therapy: Patient Spontanous Breathing  Post-op Pain: none  Post-op Assessment: Patient's Cardiovascular Status Stable, Respiratory Function Stable, Patent Airway and No signs of Nausea or vomiting  Post-op Vital Signs: stable  Complications: No apparent anesthesia complications

## 2010-12-25 ENCOUNTER — Ambulatory Visit (INDEPENDENT_AMBULATORY_CARE_PROVIDER_SITE_OTHER): Payer: Medicare Other | Admitting: Otolaryngology

## 2010-12-31 ENCOUNTER — Encounter (HOSPITAL_COMMUNITY): Payer: Self-pay | Admitting: Otolaryngology

## 2011-01-09 ENCOUNTER — Ambulatory Visit (HOSPITAL_COMMUNITY): Payer: Medicare Other

## 2011-01-12 ENCOUNTER — Encounter (HOSPITAL_COMMUNITY): Payer: Medicare Other | Attending: Oncology

## 2011-01-12 VITALS — BP 144/83 | HR 84 | Temp 97.9°F | Wt 144.0 lb

## 2011-01-12 DIAGNOSIS — C8581 Other specified types of non-Hodgkin lymphoma, lymph nodes of head, face, and neck: Secondary | ICD-10-CM | POA: Insufficient documentation

## 2011-01-12 DIAGNOSIS — C8591 Non-Hodgkin lymphoma, unspecified, lymph nodes of head, face, and neck: Secondary | ICD-10-CM | POA: Insufficient documentation

## 2011-01-12 NOTE — Progress Notes (Signed)
CC:   Sierra Good, M.D. [name], MD  This is a delightful 77 year old woman [place] here today with her daughter and son-in-law for evaluation and discussion of a recent diagnosis of non-Hodgkin lymphoma.  HISTORY OF PRESENT ILLNESS:  This woman has been in excellent health. She has a relevant history of 6 to 7 months of progressive sinus congestion that has been refractory to various local and symptomatic therapies.  She ultimately sought medical attention and was referred Dr. Suszanne Conners, who took her to the OR for a sinonasal surgery.  At the time of surgery, it was felt that she would need a septoplasty and bilateral turbinate resections.  Her surgery was 12/18/2010.  At the time of surgery, he noted that the anterior one-third of the right inferior turbinate was covered with friable tissue.  A similar portion of the right nasal septum was covered with friable edematous soft tissue. Biopsy specimens were made from the friable mucosal surface.  The patient had a fairly good recovery.  She continues to have sinus congestion and sinus drainage and uses saline nasal spray.  She has otherwise done well.  She denies any epistaxis either before or after surgery.  PAST MEDICAL HISTORY:  General health has been good.  She has a history of hypertension and hypercholesterolemia.  CURRENT MEDICATIONS:  Multivitamin, Co-Q10, 50 mg 1/2 tablet Norvasc, omeprazole 20 mg daily, 1/2 a baby aspirin a day.  ALLERGIES:  She has allergy to statin drugs.  PAST SURGICAL HISTORY:  Bunion and hammer toe surgery in 2000, carpal tunnel surgery in 2007, cataract surgery in 2008, total hip replacement in 2009, and partial colectomy in 2010 for benign disease.  HABITS:  Nonsmoker, non-alcohol consumer.  ALLERGIES:  As noted, statins.  FAMILY MEDICAL HISTORY:  Both parents deceased, father from stroke, mother from colon cancer at ages 22 and 85 respectively.  She has a brother that died at  an early age and was mentally retarded.  There is no other history of breast or ovarian cancer in the family or lymphoma.  SOCIAL HISTORY:  She lives by herself and has been widowed for 22 years, married for 47 years.  She has 3 adult children, ages 17, 54, and 62. She has no grandchildren.  She has 1 son who lives in [place], the others live in the area.  Nonsmoker, non-alcohol consumer.  REVIEW OF SYSTEMS:  Denies headaches, blurry vision.  Denies diplopia. Denies any bloody nasal drainage currently.  Denies any pain in her face.  Denies any change in her voice.  Denies shortness of breath or cough.  Denies nausea or vomiting.  Denies chest pain.  Denies abdominal pain, nausea, vomiting, diarrhea.  Denies any numbness or tingling in hands and feet.  PHYSICAL EXAMINATION:  General:  Today a delightful-appearing woman, looking stated age.  Vital Signs:  Blood pressure 144/83, pulse 84, respiratory rate is 18, temperature 97.9.  Head and Neck Exam:  No palpable adenopathy in the head and neck area.  No tenderness around the nodes.  The nasal septum looks intact.  Extraocular movements are normal.  No scleral icterus.  No palpable peripheral adenopathy.  Lungs: Clear to auscultation and percussion.  Heart:  Heart sounds normal.  No heaves, thrills, bruits, or murmurs.  Breasts:  Not examined.  Both axillae negative.  Abdomen:  Soft.  No palpable hepatosplenomegaly.  No inguinal adenopathy.  Extremities:  No peripheral cyanosis, clubbing, or edema.  Neurologic Exam:  Grossly intact.  IMPRESSION  AND PLAN:  This patient had a biopsy at the time of her septoplasty.  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  Immunohistochemical stains are positive for LCA, CD20, CD10, CD3, CD43, and CD138.  No mention is made of Burkitt-type changes.  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  We spent  about 40 minutes today discussing the diagnosis and possible therapies.  The patient needs to be staged with a CT PET, as well as an MRI of the nasopharynx.  She also needs a bone marrow biopsy.  As far as therapy is concerned, I think that at minimum, I would recommend radiation therapy with Rituxan-based treatments alone.  One issue would be whether with her limited stage of disease whether or not she would benefit from intrathecal therapy, given the high risk for CNS involvement.  The other issue is whether she would benefit from any systemic therapy, assuming that her disease is localized as a clinical stage IE.  My bias would be for her to have again radiation therapy to the area with possible Rituxan-based therapy alone.  Alternately if chemotherapy is entertained, she may benefit from a CVP-type regimen without using anthracyclines.  She will also be referred to Radiation Oncology to get their insights as to the possible.  I have scheduled her to see Dr. Mariel Sleet again in followup after she has had her staging evaluation, including a bone marrow biopsy in approximately 10 to 14 days.  Thank you for the opportunity to consult on this patient.    ______________________________ Pierce Crane, M.D., F.R.C.P.C. PR/MEDQ  D:  01/12/2011  T:  01/12/2011  Job:  [id number]

## 2011-01-12 NOTE — Patient Instructions (Signed)
[facility]  Discharge Instructions  SPECIAL INSTRUCTIONS/FOLLOW-UP: Lab work Needed before your next office visit, Xray Studies Needed PET scan, MRI, and bone marrow biopsy  and Return to Clinic in 10-14 days after all testing complete.  We will call you with all of your appointments.   I acknowledge that I have been informed and understand all the instructions given to me and received a copy. I do not have any more questions at this time, but understand that I may call the Specialty Clinic at [facility] at [phone] during business hours should I have any further questions or need assistance in obtaining follow-up care.    __________________________________________  _____________  __________ Signature of Patient or Authorized Representative            Date                   Time    __________________________________________ Nurse's Signature

## 2011-01-12 NOTE — Progress Notes (Signed)
Addended by: Hester Mates A on: 01/12/2011 03:59 PM   Modules accepted: Orders

## 2011-01-12 NOTE — Progress Notes (Signed)
Note dictated

## 2011-01-12 NOTE — Progress Notes (Signed)
Addended by: Hester Mates A on: 01/12/2011 03:48 PM   Modules accepted: Orders

## 2011-01-12 NOTE — Progress Notes (Signed)
Addended by: Hester Mates A on: 01/12/2011 02:55 PM   Modules accepted: Orders

## 2011-01-14 ENCOUNTER — Other Ambulatory Visit (HOSPITAL_COMMUNITY): Payer: Self-pay | Admitting: Oncology

## 2011-01-15 ENCOUNTER — Other Ambulatory Visit (HOSPITAL_COMMUNITY): Payer: Self-pay | Admitting: Oncology

## 2011-01-15 DIAGNOSIS — C8590 Non-Hodgkin lymphoma, unspecified, unspecified site: Secondary | ICD-10-CM

## 2011-01-16 ENCOUNTER — Ambulatory Visit (HOSPITAL_COMMUNITY)
Admission: RE | Admit: 2011-01-16 | Discharge: 2011-01-16 | Disposition: A | Discharge status: Home / Self Care | Payer: Medicare Other | Source: Ambulatory Visit | Attending: Oncology | Admitting: Oncology

## 2011-01-16 ENCOUNTER — Other Ambulatory Visit (HOSPITAL_COMMUNITY): Payer: Self-pay | Admitting: Oncology

## 2011-01-16 DIAGNOSIS — C8591 Non-Hodgkin lymphoma, unspecified, lymph nodes of head, face, and neck: Secondary | ICD-10-CM

## 2011-01-16 DIAGNOSIS — C8581 Other specified types of non-Hodgkin lymphoma, lymph nodes of head, face, and neck: Secondary | ICD-10-CM | POA: Insufficient documentation

## 2011-01-16 MED ORDER — GADOBENATE DIMEGLUMINE 529 MG/ML IV SOLN
13.0000 mL | Freq: Once | INTRAVENOUS | Status: AC | PRN
  Administered 2011-01-16: 13 mL via INTRAVENOUS

## 2011-01-19 ENCOUNTER — Encounter (HOSPITAL_COMMUNITY)
Admission: RE | Admit: 2011-01-19 | Discharge: 2011-01-19 | Disposition: A | Discharge status: Home / Self Care | Payer: Medicare Other | Source: Ambulatory Visit | Attending: Oncology | Admitting: Oncology

## 2011-01-19 DIAGNOSIS — I2510 Atherosclerotic heart disease of native coronary artery without angina pectoris: Secondary | ICD-10-CM | POA: Insufficient documentation

## 2011-01-19 DIAGNOSIS — C8591 Non-Hodgkin lymphoma, unspecified, lymph nodes of head, face, and neck: Secondary | ICD-10-CM

## 2011-01-19 DIAGNOSIS — C8581 Other specified types of non-Hodgkin lymphoma, lymph nodes of head, face, and neck: Secondary | ICD-10-CM | POA: Insufficient documentation

## 2011-01-19 LAB — GLUCOSE, CAPILLARY: Glucose-Capillary: 100 mg/dL — ABNORMAL HIGH (ref 70–99)

## 2011-01-19 MED ORDER — FLUDEOXYGLUCOSE F - 18 (FDG) INJECTION: 15.8000 | Freq: Once | INTRAVENOUS | Status: AC | PRN

## 2011-01-20 ENCOUNTER — Other Ambulatory Visit (HOSPITAL_COMMUNITY): Payer: Medicare Other

## 2011-01-22 ENCOUNTER — Ambulatory Visit (INDEPENDENT_AMBULATORY_CARE_PROVIDER_SITE_OTHER): Payer: Medicare Other | Admitting: Otolaryngology

## 2011-01-27 ENCOUNTER — Other Ambulatory Visit (HOSPITAL_COMMUNITY): Payer: Self-pay | Admitting: Oncology

## 2011-01-27 ENCOUNTER — Other Ambulatory Visit: Payer: Self-pay | Admitting: Interventional Radiology

## 2011-01-27 ENCOUNTER — Ambulatory Visit (HOSPITAL_COMMUNITY): Payer: Medicare Other

## 2011-01-27 ENCOUNTER — Ambulatory Visit (HOSPITAL_COMMUNITY)
Admission: RE | Admit: 2011-01-27 | Discharge: 2011-01-27 | Disposition: A | Discharge status: Home / Self Care | Payer: Medicare Other | Source: Ambulatory Visit | Attending: Oncology | Admitting: Oncology

## 2011-01-27 DIAGNOSIS — C8589 Other specified types of non-Hodgkin lymphoma, extranodal and solid organ sites: Secondary | ICD-10-CM | POA: Insufficient documentation

## 2011-01-27 DIAGNOSIS — C8590 Non-Hodgkin lymphoma, unspecified, unspecified site: Secondary | ICD-10-CM

## 2011-01-27 LAB — CBC
HCT: 46.4 % — ABNORMAL HIGH (ref 36.0–46.0)
Hemoglobin: 15.6 g/dL — ABNORMAL HIGH (ref 12.0–15.0)
MCH: 31.4 pg (ref 26.0–34.0)
MCV: 93.4 fL (ref 78.0–100.0)
RBC: 4.97 MIL/uL (ref 3.87–5.11)

## 2011-01-30 ENCOUNTER — Encounter (HOSPITAL_BASED_OUTPATIENT_CLINIC_OR_DEPARTMENT_OTHER): Payer: Medicare Other

## 2011-01-30 DIAGNOSIS — C8581 Other specified types of non-Hodgkin lymphoma, lymph nodes of head, face, and neck: Secondary | ICD-10-CM

## 2011-01-30 DIAGNOSIS — C8591 Non-Hodgkin lymphoma, unspecified, lymph nodes of head, face, and neck: Secondary | ICD-10-CM

## 2011-01-30 LAB — CBC
HCT: 48.8 % — ABNORMAL HIGH (ref 36.0–46.0)
Hemoglobin: 16.3 g/dL — ABNORMAL HIGH (ref 12.0–15.0)
MCV: 94.4 fL (ref 78.0–100.0)
Platelets: 271 10*3/uL (ref 150–400)
RBC: 5.17 MIL/uL — ABNORMAL HIGH (ref 3.87–5.11)
WBC: 9.6 10*3/uL (ref 4.0–10.5)

## 2011-01-30 LAB — DIFFERENTIAL
Eosinophils Relative: 1 % (ref 0–5)
Lymphocytes Relative: 21 % (ref 12–46)
Lymphs Abs: 2 10*3/uL (ref 0.7–4.0)
Monocytes Absolute: 0.5 10*3/uL (ref 0.1–1.0)
Monocytes Relative: 6 % (ref 3–12)

## 2011-01-30 LAB — COMPREHENSIVE METABOLIC PANEL
ALT: 14 U/L (ref 0–35)
CO2: 28 mEq/L (ref 19–32)
Calcium: 10.4 mg/dL (ref 8.4–10.5)
GFR calc Af Amer: >60 mL/min (ref >60)
GFR calc non Af Amer: 53 mL/min — ABNORMAL LOW (ref >60)
Glucose, Bld: 74 mg/dL (ref 70–99)
Sodium: 138 mEq/L (ref 135–145)

## 2011-01-30 NOTE — Progress Notes (Signed)
Labs drawn today for cbc/diff,cmp,ldh,b2mic 

## 2011-02-02 ENCOUNTER — Encounter (HOSPITAL_BASED_OUTPATIENT_CLINIC_OR_DEPARTMENT_OTHER): Payer: Medicare Other | Admitting: Oncology

## 2011-02-02 ENCOUNTER — Encounter (HOSPITAL_COMMUNITY): Payer: Self-pay | Admitting: Oncology

## 2011-02-02 VITALS — BP 163/85 | HR 85 | Temp 97.5°F | Wt 144.2 lb

## 2011-02-02 DIAGNOSIS — C8581 Other specified types of non-Hodgkin lymphoma, lymph nodes of head, face, and neck: Secondary | ICD-10-CM

## 2011-02-02 DIAGNOSIS — C8591 Non-Hodgkin lymphoma, unspecified, lymph nodes of head, face, and neck: Secondary | ICD-10-CM

## 2011-02-02 NOTE — Patient Instructions (Signed)
[facility]  Discharge Instructions  RECOMMENDATIONS MADE BY THE CONSULTANT AND ANY TEST RESULTS WILL BE SENT TO YOUR REFERRING DOCTOR.   EXAM FINDINGS BY MD TODAY AND SIGNS AND SYMPTOMS TO REPORT TO CLINIC OR PRIMARY MD:   Dr. Malvin Johns to do portacath. Your chemo regimen will be  Cytoxan/Vincristine/Prednsione and Rituxan. Lafonda Mosses from [facility] should call us with an appointment - hopefully by today. If she doesn't we will contact [facility]. You can still do radiation in [place] however.    I acknowledge that I have been informed and understand all the instructions given to me and received a copy. I do not have any more questions at this time, but understand that I may call the Specialty Clinic at [facility] at [phone] during business hours should I have any further questions or need assistance in obtaining follow-up care.    __________________________________________  _____________  __________ Signature of Patient or Authorized Representative            Date                   Time    __________________________________________ Nurse's Signature

## 2011-02-02 NOTE — Progress Notes (Unsigned)
Her PET scan report, as I mentioned, has been done and is negative except for the disease in the right nasal cavity where she has an SUV uptake of 9.1.  DICTATION ENDS HERE    ______________________________ Sierra Good. Mariel Sleet, MD ESN/MEDQ  D:  02/02/2011  T:  02/02/2011  Job:  [id number]

## 2011-02-02 NOTE — Progress Notes (Signed)
This office note has been dictated.

## 2011-02-03 NOTE — Progress Notes (Signed)
CC:   Sierra Pies, MD Barbaraann Barthel, M.D. Billie Lade, Ph.D., M.D. Delaney Meigs, M.D.  DIAGNOSES: 1. 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 2. Allergy to statin drugs. 3. History of hypercholesterolemia. 4. History of hypertension. 5. History of total hip replacement in 2009. 6. History of partial colectomy in 2010 for a benign polyp. 7. Cataract surgery in 2008. 8. Carpal tunnel surgery in 2007. 9. Hammertoe surgery in 2000. 10.History of atrial fibrillation in the past.  It sounds like she is     not on medications presently.  This is a very pleasant 77 year old lady who was seen by my colleague Dr. Pierce Crane on August 6 and she was found to have a nasal lymphoma after several months of sinus congestion.  It goes back around 6-7 months.  She went to Dr. Suszanne Conners through a friend and had a biopsy on 12/18/2010.  The biopsy came back showing a diffuse large B-cell lymphoma as read by Dr. Guerry Bruin.  It was CD20 positive.  She has had a bone marrow aspirate and biopsy which was negative for involvement.  She has had a PET scan which was mentioned above showing 1 definite node involved and a questionable node at best.  SUV was only 3.4 there.  The right nasal mass is small but definitely positive on PET.  She is here today with her 2 daughters to go over these issues and staging.  She has been in really fairly good shape.  She still lives independently.  She has 2 children with her today, [name] and Okey Regal, and she has a son who lives in [place].  All have been in good health. She is not a smoker, not a drinker.  Her vital signs today show that her weight is the same as it was 3  weeks ago.  Her blood pressure is 163/85 today.  Pulses right around 80 and regular.  Respirations 16 and unlabored.  She is afebrile.  Skin is warm, dry to the touch and she is in no acute distress.  I could not feel any distinct adenopathy on her head and neck exam or any other area of the body including axillary, inguinal or cervical areas or infraclavicular or supraclavicular.  Breast exam was negative for masses.  Lungs were clear to auscultation and percussion.  Heart showed a regular rhythm and rate at this time without murmur, rub or gallop.  She had no obvious peripheral edema.  Abdominal scar is well- healed.  She has no arm edema.  No hepatosplenomegaly.  No obvious ascites.  No abdominal distension.  Looking at her PET scan and discussing it with Dr. Arnette Schaumann we feel that she is still stage IE and hopefully can be cured with a combination of chemotherapy and radiation, and I would like to proceed with CVP plus Rituxan for 3 cycles, restage her with a PET scan, __________  and then go on to radiation therapy to the certainly involved fields plus or minus another nodal group below, but I will leave that to Dr. Trina Ao expertise.  I think I would give her  Rituxan for a total of 6 treatments but the CVP for 3.  I think I would like to avoid R-CHOP if possible at her age.  I think at age 83 it might be very difficult to treat her with chemotherapy alone and I would like to try to cure her with a combination approach.  I have discussed her with Dr. Roselind Messier today by phone.  He will see her this Thursday.  Dr. Malvin Johns will see her for a Port-A-Cath this week.  I will see her in a few weeks, sooner if need be.    ______________________________ Ladona Horns. Mariel Sleet, MD ESN/MEDQ  D:  02/02/2011  T:  02/03/2011  Job:  [id number]

## 2011-02-04 ENCOUNTER — Encounter (HOSPITAL_COMMUNITY): Payer: Medicare Other

## 2011-02-04 ENCOUNTER — Encounter (HOSPITAL_COMMUNITY)
Admission: RE | Admit: 2011-02-04 | Discharge: 2011-02-04 | Disposition: A | Discharge status: Home / Self Care | Payer: Medicare Other | Source: Ambulatory Visit | Attending: General Surgery | Admitting: General Surgery

## 2011-02-04 ENCOUNTER — Other Ambulatory Visit (HOSPITAL_COMMUNITY): Payer: Self-pay | Admitting: Oncology

## 2011-02-04 ENCOUNTER — Encounter (HOSPITAL_COMMUNITY): Payer: Self-pay

## 2011-02-04 DIAGNOSIS — C8591 Non-Hodgkin lymphoma, unspecified, lymph nodes of head, face, and neck: Secondary | ICD-10-CM

## 2011-02-04 HISTORY — DX: Malignant (primary) neoplasm, unspecified: C80.1

## 2011-02-04 LAB — SURGICAL PCR SCREEN: MRSA, PCR: NEGATIVE

## 2011-02-04 NOTE — Patient Instructions (Addendum)
[address]  02/04/2011   Your procedure is scheduled on:  02/10/2011  Report to [facility] at  615  AM.  Call this number if you have problems the morning of surgery: [phone]   Remember:   Do not eat food:After Midnight.  Do not drink clear liquids: After Midnight.  Take these medicines the morning of surgery with A SIP OF WATER: norvasc,prilosec   Do not wear jewelry, make-up or nail polish.  Do not wear lotions, powders, or perfumes. You may wear deodorant.  Do not shave 48 hours prior to surgery.  Do not bring valuables to the hospital.  Contacts, dentures or bridgework may not be worn into surgery.  Leave suitcase in the car. After surgery it may be brought to your room.  For patients admitted to the hospital, checkout time is 11:00 AM the day of discharge.   Patients discharged the day of surgery will not be allowed to drive home.  Name and phone number of your driver: family  Special Instructions: CHG Shower Use Special Wash: 1/2 bottle night before surgery and 1/2 bottle morning of surgery.   Please read over the following fact sheets that you were given: Pain Booklet, MRSA Information, Surgical Site Infection Prevention, Anesthesia Post-op Instructions and Care and Recovery After Surgery PATIENT INSTRUCTIONS POST-ANESTHESIA  IMMEDIATELY FOLLOWING SURGERY:  Do not drive or operate machinery for the first twenty four hours after surgery.  Do not make any important decisions for twenty four hours after surgery or while taking narcotic pain medications or sedatives.  If you develop intractable nausea and vomiting or a severe headache please notify your doctor immediately.  FOLLOW-UP:  Please make an appointment with your surgeon as instructed. You do not need to follow up with anesthesia unless specifically instructed to do so.  WOUND CARE INSTRUCTIONS (if applicable):  Keep a dry clean dressing on the anesthesia/puncture wound site if there is drainage.  Once the wound  has quit draining you may leave it open to air.  Generally you should leave the bandage intact for twenty four hours unless there is drainage.  If the epidural site drains for more than 36-48 hours please call the anesthesia department.  QUESTIONS?:  Please feel free to call your physician or the hospital operator if you have any questions, and they will be happy to assist you.     [facility] Anesthesia Department [address] [place] [phone]

## 2011-02-04 NOTE — Patient Instructions (Signed)
[facility]   CHEMOTHERAPY INSTRUCTIONS   POTENTIAL SIDE EFFECTS OF TREATMENT: Increased Susceptibility to Infection, Vomiting, Constipation, Red or Pink Urine (with Adriamycin), Hair Thinning, Changes in Character of Skin and Nails (brittleness, dryness,etc.), Pigment Changes (darkening of nail beds, palms of hands, soles of feet, etc.), Bone Marrow Suppression, Abdominal Cramping, Urinary Frequency and Blood in Urine   SELF IMAGE NEEDS AND REFERRALS MADE: Obtain hair accessories as soon as possible (wigs, scarves, turbans,caps,etc.), attend the Look Good Feel Better program if desired  EDUCATIONAL MATERIALS GIVEN AND REVIEWED: Chemotherapy and You   SELF CARE ACTIVITIES WHILE ON CHEMOTHERAPY: Increase your fluid intake 48 hours prior to treatment and drink at least 2 quarts per day after treatment.(or just make it your goal to drink 64 oz of decaff fluids daily) No alcohol intake., No aspirin or other medications unless approved by your oncologist., Eat foods that are light and easy to digest., Eat foods at cold or room temperature., No fried, fatty, or spicy foods immediately before or after treatment., Have teeth cleaned professionally before starting treatment. Keep dentures and partial plates clean., Use soft toothbrush and do not use mouthwashes that contain alcohol. Biotene is a good mouthwash that is available at most pharmacies or may be ordered by calling [phone]., Use warm salt water gargles (1 teaspoon salt per 1 quart warm water) before and after meals and at bedtime. Or you may rinse with 2 tablespoons of three -percent hydrogen peroxide mixed in eight ounces of water., Always use sunscreen with SPF (Sun Protection Factor) of 30 or higher. and Use your nausea medication as directed to prevent nausea.   MEDICATIONS: You have been given prescriptions for the following medications: EMLA cream - apply a quarter sized glob to port site 1 hour  prior to chemotherapy. Do not rub in. Cover with plastic.  Ativan 1mg  by mouth or under the tongue every 3-4 hours prn nausea/vomiting Compazine suppository 25mg  - insert 1 suppository every 6 hours as needed for nausea/vomiting.  Buy these meds Over-the-counter: Benadryl 50mg  1 hour prior to Rituxan and Tylenol 650mg  Take 1 hour prior to Rituxan   SYMPTOMS TO REPORT AS SOON AS POSSIBLE AFTER TREATMENT:  FEVER GREATER THAN 100.5 F  CHILLS WITH OR WITHOUT FEVER  NAUSEA AND VOMITING THAT IS NOT CONTROLLED WITH YOUR NAUSEA MEDICATION  UNUSUAL SHORTNESS OF BREATH  UNUSUAL BRUISING OR BLEEDING  TENDERNESS IN MOUTH AND THROAT WITH OR WITHOUT PRESENCE OF ULCERS  URINARY PROBLEMS  BOWEL PROBLEMS  UNUSUAL RASH    Wear comfortable clothing and clothing appropriate for easy access to any Portacath or PICC line. Let us know if there is anything that we can do to make your therapy better!      I have been informed and understand all of the instructions given to me and have received a copy. I have been instructed to call the clinic [phone] or my family physician as soon as possible for continued medical care, if indicated. I do not have any more questions at this time but understand that I may call the Cancer Center or the Patient Navigator at [phone] during office hours should I have questions or need assistance in obtaining follow-up care.      _________________________________________      _______________     __________ Signature of Patient or Authorized Representative        Date  Time      _________________________________________ Nurse's Signature

## 2011-02-05 MED ORDER — PREDNISONE 20 MG PO TABS: ORAL_TABLET | ORAL | Status: DC

## 2011-02-05 MED ORDER — LORAZEPAM 0.5 MG PO TABS: 0.5000 mg | ORAL_TABLET | Freq: Four times a day (QID) | ORAL | Status: DC | PRN

## 2011-02-05 MED ORDER — PROCHLORPERAZINE 25 MG RE SUPP: 25.0000 mg | Freq: Two times a day (BID) | RECTAL | Status: DC | PRN

## 2011-02-05 MED ORDER — ONDANSETRON HCL 8 MG PO TABS: ORAL_TABLET | ORAL | Status: DC

## 2011-02-05 NOTE — Progress Notes (Signed)
Chemo teaching done. Patient and family verbalized understanding of chemo instructions. Pre meds called into [facility].

## 2011-02-06 NOTE — Pre-Procedure Instructions (Signed)
ptt  41, reported to Dr. Tollie Eth , no futher orders.  Dr.  Bertram Savin office notified ,  Spoke with Bonita Quin , she is to inform Dr. Malvin Johns.

## 2011-02-10 ENCOUNTER — Encounter (HOSPITAL_COMMUNITY): Payer: Self-pay | Admitting: Anesthesiology

## 2011-02-10 ENCOUNTER — Ambulatory Visit (HOSPITAL_COMMUNITY)
Admission: RE | Admit: 2011-02-10 | Discharge: 2011-02-10 | Disposition: A | Discharge status: Home / Self Care | Payer: Medicare Other | Source: Ambulatory Visit | Attending: General Surgery | Admitting: General Surgery

## 2011-02-10 ENCOUNTER — Encounter (HOSPITAL_COMMUNITY)
Admission: RE | Disposition: A | Discharge status: Home / Self Care | Payer: Self-pay | Source: Ambulatory Visit | Attending: General Surgery

## 2011-02-10 ENCOUNTER — Ambulatory Visit (HOSPITAL_BASED_OUTPATIENT_CLINIC_OR_DEPARTMENT_OTHER): Payer: Medicare Other

## 2011-02-10 ENCOUNTER — Encounter (HOSPITAL_COMMUNITY): Payer: Self-pay

## 2011-02-10 ENCOUNTER — Ambulatory Visit (HOSPITAL_COMMUNITY): Payer: Medicare Other

## 2011-02-10 ENCOUNTER — Ambulatory Visit (HOSPITAL_COMMUNITY): Payer: Medicare Other | Admitting: Anesthesiology

## 2011-02-10 DIAGNOSIS — Z5112 Encounter for antineoplastic immunotherapy: Secondary | ICD-10-CM

## 2011-02-10 DIAGNOSIS — Z7982 Long term (current) use of aspirin: Secondary | ICD-10-CM | POA: Insufficient documentation

## 2011-02-10 DIAGNOSIS — E7800 Pure hypercholesterolemia, unspecified: Secondary | ICD-10-CM | POA: Insufficient documentation

## 2011-02-10 DIAGNOSIS — C8581 Other specified types of non-Hodgkin lymphoma, lymph nodes of head, face, and neck: Secondary | ICD-10-CM | POA: Insufficient documentation

## 2011-02-10 DIAGNOSIS — Z79899 Other long term (current) drug therapy: Secondary | ICD-10-CM | POA: Insufficient documentation

## 2011-02-10 DIAGNOSIS — I10 Essential (primary) hypertension: Secondary | ICD-10-CM | POA: Insufficient documentation

## 2011-02-10 DIAGNOSIS — C8591 Non-Hodgkin lymphoma, unspecified, lymph nodes of head, face, and neck: Secondary | ICD-10-CM

## 2011-02-10 DIAGNOSIS — Z01812 Encounter for preprocedural laboratory examination: Secondary | ICD-10-CM | POA: Insufficient documentation

## 2011-02-10 HISTORY — PX: PORTACATH PLACEMENT: SHX2246

## 2011-02-10 SURGERY — INSERTION, TUNNELED CENTRAL VENOUS DEVICE, WITH PORT
Anesthesia: Monitor Anesthesia Care | Laterality: Left | Wound class: Clean

## 2011-02-10 MED ORDER — DIPHENHYDRAMINE HCL 25 MG PO CAPS
50.0000 mg | ORAL_CAPSULE | Freq: Once | ORAL | Status: AC
  Administered 2011-02-10: 50 mg via ORAL

## 2011-02-10 MED ORDER — PROPOFOL 10 MG/ML IV EMUL
INTRAVENOUS | Status: DC | PRN
  Administered 2011-02-10: 25 ug/kg/min via INTRAVENOUS

## 2011-02-10 MED ORDER — PROPOFOL 10 MG/ML IV EMUL
INTRAVENOUS | Status: AC
  Filled 2011-02-10: qty 20

## 2011-02-10 MED ORDER — LIDOCAINE HCL (PF) 1 % IJ SOLN
INTRAMUSCULAR | Status: DC | PRN
  Administered 2011-02-10: 4 mL

## 2011-02-10 MED ORDER — BACITRACIN ZINC 500 UNIT/GM EX OINT
TOPICAL_OINTMENT | CUTANEOUS | Status: DC | PRN
  Administered 2011-02-10: 1 via TOPICAL

## 2011-02-10 MED ORDER — SODIUM CHLORIDE 0.9 % IR SOLN
Status: DC | PRN
  Administered 2011-02-10: 1000 mL

## 2011-02-10 MED ORDER — BACITRACIN-NEOMYCIN-POLYMYXIN 400-5-5000 EX OINT
TOPICAL_OINTMENT | CUTANEOUS | Status: DC | PRN
  Administered 2011-02-10: 1 via TOPICAL

## 2011-02-10 MED ORDER — FENTANYL CITRATE 0.05 MG/ML IJ SOLN
INTRAMUSCULAR | Status: AC
  Filled 2011-02-10: qty 2

## 2011-02-10 MED ORDER — BACITRACIN ZINC 500 UNIT/GM EX OINT
TOPICAL_OINTMENT | CUTANEOUS | Status: AC
  Filled 2011-02-10: qty 0.9

## 2011-02-10 MED ORDER — MIDAZOLAM HCL 2 MG/2ML IJ SOLN
INTRAMUSCULAR | Status: AC
  Filled 2011-02-10: qty 2

## 2011-02-10 MED ORDER — LIDOCAINE HCL (PF) 1 % IJ SOLN
INTRAMUSCULAR | Status: AC
  Filled 2011-02-10: qty 30

## 2011-02-10 MED ORDER — STERILE WATER FOR IRRIGATION IR SOLN
Status: DC | PRN
  Administered 2011-02-10: 1000 mL

## 2011-02-10 MED ORDER — ACETAMINOPHEN 325 MG PO TABS
650.0000 mg | ORAL_TABLET | Freq: Once | ORAL | Status: AC
  Administered 2011-02-10: 650 mg via ORAL
  Filled 2011-02-10: qty 2

## 2011-02-10 MED ORDER — MIDAZOLAM HCL 2 MG/2ML IJ SOLN
1.0000 mg | INTRAMUSCULAR | Status: DC | PRN
  Administered 2011-02-10: 2 mg via INTRAVENOUS

## 2011-02-10 MED ORDER — HEPARIN SOD (PORK) LOCK FLUSH 100 UNIT/ML IV SOLN
INTRAVENOUS | Status: DC | PRN
  Administered 2011-02-10: 500 [IU] via INTRAVENOUS

## 2011-02-10 MED ORDER — LACTATED RINGERS IV SOLN: INTRAVENOUS | Status: DC

## 2011-02-10 MED ORDER — MIDAZOLAM HCL 2 MG/2ML IJ SOLN
INTRAMUSCULAR | Status: AC
  Administered 2011-02-10: 2 mg via INTRAVENOUS
  Filled 2011-02-10: qty 2

## 2011-02-10 MED ORDER — ONDANSETRON HCL 4 MG/2ML IJ SOLN
INTRAMUSCULAR | Status: AC
  Filled 2011-02-10: qty 2

## 2011-02-10 MED ORDER — CEFAZOLIN SODIUM 1-5 GM-% IV SOLN
INTRAVENOUS | Status: DC | PRN
  Administered 2011-02-10: 1 g via INTRAVENOUS

## 2011-02-10 MED ORDER — FENTANYL CITRATE 0.05 MG/ML IJ SOLN
INTRAMUSCULAR | Status: DC | PRN
  Administered 2011-02-10 (×2): 50 ug via INTRAVENOUS

## 2011-02-10 MED ORDER — LACTATED RINGERS IV SOLN
INTRAVENOUS | Status: DC
  Administered 2011-02-10: 07:00:00 via INTRAVENOUS

## 2011-02-10 MED ORDER — RITUXIMAB CHEMO INJECTION 10 MG/ML
375.0000 mg/m2 | Freq: Once | INTRAVENOUS | Status: AC
  Administered 2011-02-10: 700 mg via INTRAVENOUS
  Filled 2011-02-10: qty 70

## 2011-02-10 MED ORDER — HEPARIN SODIUM (PORCINE) 1000 UNIT/ML IJ SOLN
INTRAMUSCULAR | Status: DC | PRN
  Administered 2011-02-10: 3000 [IU] via INTRAVENOUS

## 2011-02-10 MED ORDER — LIDOCAINE HCL (PF) 1 % IJ SOLN
INTRAMUSCULAR | Status: AC
  Filled 2011-02-10: qty 5

## 2011-02-10 MED ORDER — SODIUM CHLORIDE 0.9 % IV SOLN
Freq: Once | INTRAVENOUS | Status: AC
  Administered 2011-02-10: 11:00:00 via INTRAVENOUS

## 2011-02-10 MED ORDER — ONDANSETRON HCL 4 MG/2ML IJ SOLN
4.0000 mg | Freq: Once | INTRAMUSCULAR | Status: AC
  Administered 2011-02-10: 4 mg via INTRAVENOUS

## 2011-02-10 MED ORDER — ONDANSETRON 8 MG/50ML IVPB (CHCC): 16.0000 mg | Freq: Once | INTRAVENOUS | Status: DC

## 2011-02-10 MED ORDER — SODIUM CHLORIDE 0.9 % IV SOLN
Freq: Once | INTRAVENOUS | Status: DC
  Filled 2011-02-10: qty 8

## 2011-02-10 MED ORDER — FENTANYL CITRATE 0.05 MG/ML IJ SOLN: 25.0000 ug | INTRAMUSCULAR | Status: DC | PRN

## 2011-02-10 MED ORDER — DEXAMETHASONE SODIUM PHOSPHATE 4 MG/ML IJ SOLN: 20.0000 mg | Freq: Once | INTRAMUSCULAR | Status: DC

## 2011-02-10 MED ORDER — CEFAZOLIN SODIUM 1-5 GM-% IV SOLN: 1.0000 g | INTRAVENOUS | Status: DC

## 2011-02-10 MED ORDER — CEFAZOLIN SODIUM 1-5 GM-% IV SOLN
INTRAVENOUS | Status: AC
  Filled 2011-02-10: qty 50

## 2011-02-10 MED ORDER — ONDANSETRON HCL 4 MG/2ML IJ SOLN: 4.0000 mg | Freq: Once | INTRAMUSCULAR | Status: DC | PRN

## 2011-02-10 MED ORDER — HEPARIN SODIUM (PORCINE) 1000 UNIT/ML IJ SOLN
INTRAMUSCULAR | Status: AC
  Filled 2011-02-10: qty 3

## 2011-02-10 MED ORDER — HEPARIN SOD (PORK) LOCK FLUSH 100 UNIT/ML IV SOLN
INTRAVENOUS | Status: AC
  Filled 2011-02-10: qty 5

## 2011-02-10 MED ORDER — MIDAZOLAM HCL 5 MG/ML IJ SOLN
INTRAMUSCULAR | Status: DC | PRN
  Administered 2011-02-10: 2 mg via INTRAVENOUS

## 2011-02-10 SURGICAL SUPPLY — 45 items
APPLIER CLIP 9.375 SM OPEN (CLIP)
BAG DECANTER FOR FLEXI CONT (MISCELLANEOUS) ×2 IMPLANT
BAG HAMPER (MISCELLANEOUS) ×2 IMPLANT
CLIP APPLIE 9.375 SM OPEN (CLIP) IMPLANT
CLOTH BEACON ORANGE TIMEOUT ST (SAFETY) ×2 IMPLANT
COVER LIGHT HANDLE STERIS (MISCELLANEOUS) ×4 IMPLANT
DECANTER SPIKE VIAL GLASS SM (MISCELLANEOUS) IMPLANT
DRAPE C-ARM FOLDED MOBILE STRL (DRAPES) ×2 IMPLANT
DRSG TEGADERM 2-3/8X2-3/4 SM (GAUZE/BANDAGES/DRESSINGS) ×2 IMPLANT
ELECT REM PT RETURN 9FT ADLT (ELECTROSURGICAL) ×2
ELECTRODE REM PT RTRN 9FT ADLT (ELECTROSURGICAL) ×1 IMPLANT
GLOVE BIOGEL PI IND STRL 7.5 (GLOVE) ×1 IMPLANT
GLOVE BIOGEL PI INDICATOR 7.5 (GLOVE) ×1
GLOVE ECLIPSE 7.0 STRL STRAW (GLOVE) ×2 IMPLANT
GLOVE EXAM NITRILE MD LF STRL (GLOVE) ×2 IMPLANT
GLOVE SKINSENSE NS SZ7.0 (GLOVE) ×1
GLOVE SKINSENSE STRL SZ7.0 (GLOVE) ×1 IMPLANT
GOWN BRE IMP SLV AUR XL STRL (GOWN DISPOSABLE) ×4 IMPLANT
IV NS 500ML (IV SOLUTION) ×1
IV NS 500ML BAXH (IV SOLUTION) ×1 IMPLANT
KIT PORT POWER 8FR ISP MRI (CATHETERS) ×2 IMPLANT
KIT ROOM TURNOVER APOR (KITS) ×2 IMPLANT
MANIFOLD NEPTUNE II (INSTRUMENTS) ×2 IMPLANT
MARKER SKIN DUAL TIP RULER LAB (MISCELLANEOUS) ×2 IMPLANT
NEEDLE HYPO 18GX1.5 BLUNT FILL (NEEDLE) ×2 IMPLANT
NEEDLE HYPO 25X1 1.5 SAFETY (NEEDLE) ×2 IMPLANT
PACK MINOR (CUSTOM PROCEDURE TRAY) ×2 IMPLANT
PAD ARMBOARD 7.5X6 YLW CONV (MISCELLANEOUS) ×2 IMPLANT
SET BASIN LINEN APH (SET/KITS/TRAYS/PACK) ×2 IMPLANT
SHEATH COOK PEEL AWAY SET 8F (SHEATH) ×2 IMPLANT
SOL PREP PROV IODINE SCRUB 4OZ (MISCELLANEOUS) ×2 IMPLANT
SPONGE DRAIN TRACH 4X4 STRL 2S (GAUZE/BANDAGES/DRESSINGS) ×2 IMPLANT
SPONGE GAUZE 2X2 8PLY STRL LF (GAUZE/BANDAGES/DRESSINGS) ×2 IMPLANT
STRIP CLOSURE SKIN 1/4X3 (GAUZE/BANDAGES/DRESSINGS) ×2 IMPLANT
SUT VIC AB 4-0 SH 27 (SUTURE) ×1
SUT VIC AB 4-0 SH 27XBRD (SUTURE) ×1 IMPLANT
SUT VIC AB 5-0 P-3 18X BRD (SUTURE) ×1 IMPLANT
SUT VIC AB 5-0 P3 18 (SUTURE) ×1
SYR 20CC LL (SYRINGE) ×2 IMPLANT
SYR 5ML LL (SYRINGE) ×4 IMPLANT
SYR BULB IRRIGATION 50ML (SYRINGE) ×2 IMPLANT
SYR CONTROL 10ML LL (SYRINGE) ×2 IMPLANT
TAPE PAPER MEDFIX 1IN X 10YD (GAUZE/BANDAGES/DRESSINGS) ×2 IMPLANT
TOWEL OR 17X26 4PK STRL BLUE (TOWEL DISPOSABLE) ×2 IMPLANT
WATER STERILE IRR 1000ML POUR (IV SOLUTION) ×4 IMPLANT

## 2011-02-10 NOTE — Anesthesia Preprocedure Evaluation (Addendum)
Anesthesia Evaluation  Name, MR# and DOB Patient awake  General Assessment Comment  Reviewed: Allergy & Precautions, H&P , NPO status , Patient's Chart, lab work & pertinent test results  History of Anesthesia Complications (+) PONV  Airway Mallampati: I  Neck ROM: Full    Dental  (+) Teeth Intact   Pulmonary  clear to auscultation  breath sounds clear to auscultation none    Cardiovascular hypertension, Pt. on medications + dysrhythmias Atrial Fibrillation Regular Normal    Neuro/Psych   GI/Hepatic/Renal        GERD Medicated and Controlled     Endo/Other    Abdominal   Musculoskeletal   Hematology lymphoma   Peds  Reproductive/Obstetrics    Anesthesia Other Findings            Anesthesia Physical Anesthesia Plan  ASA: III  Anesthesia Plan: MAC   Post-op Pain Management:    Induction: Intravenous  Airway Management Planned: Nasal Cannula  Additional Equipment:   Intra-op Plan:   Post-operative Plan:   Informed Consent: I have reviewed the patients History and Physical, chart, labs and discussed the procedure including the risks, benefits and alternatives for the proposed anesthesia with the patient or authorized representative who has indicated his/her understanding and acceptance.     Plan Discussed with:   Anesthesia Plan Comments:         Anesthesia Quick Evaluation

## 2011-02-10 NOTE — H&P (Signed)
H&P Dict#  [id number]  77 yr old W female for placement of subclavian catheter for chemo for treatment of lymphoma of nasal septum.  Procedure and risks explained and informed consent obtained.  Labs reviewed and all questions answered.

## 2011-02-10 NOTE — Progress Notes (Signed)
Post op check:  Awake and alert.  Vital signs stable. CXR shows catheter tip in SVC and no pneumothorax. Pt is to go from PACU to oncology dept for her first treatment today.

## 2011-02-10 NOTE — Anesthesia Postprocedure Evaluation (Signed)
  Anesthesia Post-op Note  Patient: Sierra Good  Procedure(s) Performed:  INSERTION PORT-A-CATH - left subclavian  Patient Location: PACU  Anesthesia Type: MAC  Level of Consciousness: awake, alert  and oriented  Airway and Oxygen Therapy: Patient Spontanous Breathing and Patient connected to nasal cannula oxygen  Post-op Pain: none  Post-op Assessment: Post-op Vital signs reviewed, Patient's Cardiovascular Status Stable and Respiratory Function Stable  Post-op Vital Signs: Reviewed and stable  Complications: No apparent anesthesia complications

## 2011-02-10 NOTE — Transfer of Care (Signed)
Immediate Anesthesia Transfer of Care Note  Patient: Sierra Good  Procedure(s) Performed:  INSERTION PORT-A-CATH - left subclavian  Patient Location: PACU  Anesthesia Type: MAC  Level of Consciousness: awake, alert  and oriented  Airway & Oxygen Therapy: Patient Spontanous Breathing and Patient connected to nasal cannula oxygen  Post-op Assessment: Report given to PACU RN, Post -op Vital signs reviewed and stable and Patient moving all extremities  Post vital signs: Reviewed and stable  Complications: No apparent anesthesia complications

## 2011-02-10 NOTE — Preoperative (Signed)
Beta Blockers   Reason not to administer Beta Blockers:Not Applicable 

## 2011-02-10 NOTE — Op Note (Signed)
OP Note dict # [id number]

## 2011-02-10 NOTE — Op Note (Signed)
NAMESHRITA, Sierra Good             ACCOUNT NO.:  [id number]  MEDICAL RECORD NO.:  [id number]  LOCATION:  [facility]                          FACILITY:  [facility]  PHYSICIAN:  Barbaraann Barthel, M.D. DATE OF BIRTH:  1922/09/23  DATE OF PROCEDURE:  02/10/2011 DATE OF DISCHARGE:                              OPERATIVE REPORT   SURGEON:  Barbaraann Barthel, MD  DIAGNOSIS:  Lymphoma, nasal septum.  PROCEDURE:  Placement of Port-A-Cath via left subclavian vein under fluoroscopy.  Note, this is an 77 year old white female who had the diagnosis of lymphoma of the nasal septum.  Surgery was consulted for placement of Port-A-Cath.  We preoperatively discussed complications not limited to, but including bleeding, infection, pneumothorax, and catheter embolization.  Informed consent was obtained.  SPECIMEN:  None.  WOUND CLASSIFICATION:  Clean.  TECHNIQUE:  The patient was placed in the supine position after the adequate administration of sedation.  Her left hemithorax was prepped with Betadine solution and draped in usual manner.  The patient was then placed in Trendelenburg position and then using 1% Xylocaine without epinephrine, an area below her left clavicle was anesthetized.  We then used an 18-gauge needle and placed a guidewire through this after cannulizing the left subclavian vein.  This was done under fluoroscopy, so we had good passage of the guidewire down into the large vessels.  We then used a venous dilator and then placed a Silastic catheter through this, so that this was in good position and that the catheter tip was in the superior vena cava.  We then irrigated the infusion device and cut the Silastic catheter to an appropriate length and then using 1% Xylocaine without epinephrine, we anesthetized an area to make a subcutaneous pouch for the infusion device and then placed the infusion device connected this to the subclavian catheter with its appropriate hub in place.  I  aspirated and injected heparin through the catheter and then placed this in the subcutaneous pocket.  We irrigated and closed the skin with 4-0 Vicryl and the skin with a subcuticular 5-0 Vicryl suture.  A 0.25-inch Steri-Strips, Neosporin, and a 2 x 2, an OpSite dressing was applied.  I left the infusion needle in place as this patient will receive her first chemotherapy treatment today after obtaining a portable chest x-ray in the recovery room.     Barbaraann Barthel, M.D.     WB/MEDQ  D:  02/10/2011  T:  02/10/2011  Job:  [id number]  cc:   Ladona Horns. Mariel Sleet, MD Fax: [phone]  [name], MD Fax: [phone]  Pierce Crane, M.D., F.R.C.P.C. Fax: [phone]  Delaney Meigs, M.D. Fax: [phone]  Billie Lade, Ph.D., M.D. Fax: [phone]

## 2011-02-10 NOTE — Consult Note (Signed)
Sierra Good, Sierra Good NO.:  [id number]  MEDICAL RECORD NO.:  [id number]  LOCATION:                                 FACILITY:  PHYSICIAN:  Sierra Good, M.D. DATE OF BIRTH:  Apr 04, 1923  DATE OF CONSULTATION:  02/09/2011 DATE OF DISCHARGE:                                CONSULTATION   Note, this is an 77 year old white female who was referred to me for placement of a Port-A-Cath for adjuvant chemotherapy for non-Hodgkin lymphoma.  I saw her in my office and history and physical her on February 03, 2011.  PHYSICAL EXAMINATION:  GENERAL:  At that time showed a very pleasant, spry, 77 year old white female who was 5 feet 6.5 inches tall, weight 144 pounds. VITAL SIGNS:  Temperature is 97.8, her pulse is 68 per minute and regular, respirations were 12 per minute, and her blood pressure was 140/82. HEENT:  Her head is normocephalic.  Eyes:  Her extraocular movements are intact.  Pupils are round, reactive to light, and accommodation.  She had undergone bilateral cataract surgery in 2009.  Her nose and mouth were moist.  She had undergone previous nasal surgery, which I will discuss later.  I did not feel any cervical adenopathy, although, there is mention of this on an abnormal PET scan, I do not palpate any submental nodes or supraclavicular nodes or cervical nodes at all that are suspicious.  There are no bruits appreciated. CHEST:  Clear, both anterior and posterior auscultation. HEART:  Regular rhythm. BREASTS AND AXILLAE:  Without masses. ABDOMEN:  Soft. RECTAL AND PELVIC:  Deferred. EXTREMITIES:  She had undergone a right hip surgery in 2009 and she had undergone right carpal tunnel surgery in 2007.  REVIEW OF SYSTEMS:  She has no history of migraines or seizures. ENDOCRINE SYSTEM:  No history of diabetes or thyroid disease. CARDIOPULMONARY SYSTEM:  She is a nonsmoker, nondrinker.  She has a history of hyperlipidemia, history of hypertension, and a  history of atrial fibrillation with a history of syncope in the past.  Her heart rate is regular at present and she is taking no anticoagulants for her atrial fib.  MUSCULOSKELETAL SYSTEM:  As mentioned above, she had a hip replacement in 2009 and had carpal tunnel surgeries.  She also has some osteoarthritis in her back.  GI SYSTEM:  No history of hepatitis.  She has a history of occasional constipation.  No history of diarrhea, bright red rectal bleeding, black tarry stools, or unexplained weight loss.  No history of inflammatory bowel disease or irritable bowel syndrome.  She underwent a colonoscopy in 2010, at which time, this was followed by colectomy for a benign polyp.  GU SYSTEM:  No history of frequency or dysuria or history of kidney stones.  OB/GYN HISTORY:  She has a gravida 3, para 3, cesarean zero, abortus zero female, with no family history of breast carcinoma.  I do not know when her last mammogram was.  PAST SURGERIES:  Included, the total hip replacement in 2009, bilateral cataract surgery in 2009, colectomy in 2010 for benign benign polyp. She also underwent minor surgery of hammertoe surgery in 2010.  MEDICATIONS:  She is taking Norvasc 5  mg, aspirin 81 mg, coenzyme Q10, multivitamins, Prilosec 20 mg capsule, Pitavastatin, and calcium 2 mg tablets.  ALLERGIES:  She is allergic to ATENOLOL and STATIN DRUGS.  This patient was referred for the placement of a Port-A-Cath.  I will likely use a left subclavian approach.  I discussed this in detail with her and her family members and obtained informed consent.  I discussed complications, not limited to, but including bleeding and infection, and possibility of pneumothorax and chest tube placement.  Informed consent was obtained.  I wanted to place this catheter sooner, however, family plans and the Labor Day holiday, I defer this until Tuesday, September 4.  DIAGNOSIS:  Therefore, is non-Hodgkins lymphoma of the nasal  septum.  PLAN:  Placement of Port-A-Cath via the outpatient department.     Sierra Good, M.D.     WB/MEDQ  D:  02/09/2011  T:  02/09/2011  Job:  [id number]  cc:   Short-stay surgical suite  Sierra Horns. Mariel Sleet, MD Fax: [phone]  Sierra Pies, MD Fax: [phone]  Sierra Good, Ph.D., M.D. Fax: [phone]  Sierra Good, M.D. Fax: [phone]

## 2011-02-11 ENCOUNTER — Other Ambulatory Visit (HOSPITAL_COMMUNITY): Payer: Self-pay | Admitting: Oncology

## 2011-02-11 ENCOUNTER — Encounter (HOSPITAL_COMMUNITY): Payer: Medicare Other | Attending: Oncology

## 2011-02-11 VITALS — BP 146/78 | HR 74 | Temp 97.7°F | Ht 66.5 in | Wt 144.0 lb

## 2011-02-11 DIAGNOSIS — C8591 Non-Hodgkin lymphoma, unspecified, lymph nodes of head, face, and neck: Secondary | ICD-10-CM

## 2011-02-11 DIAGNOSIS — R5383 Other fatigue: Secondary | ICD-10-CM | POA: Insufficient documentation

## 2011-02-11 DIAGNOSIS — C8581 Other specified types of non-Hodgkin lymphoma, lymph nodes of head, face, and neck: Secondary | ICD-10-CM

## 2011-02-11 DIAGNOSIS — Z5111 Encounter for antineoplastic chemotherapy: Secondary | ICD-10-CM

## 2011-02-11 DIAGNOSIS — R110 Nausea: Secondary | ICD-10-CM | POA: Insufficient documentation

## 2011-02-11 DIAGNOSIS — R5381 Other malaise: Secondary | ICD-10-CM | POA: Insufficient documentation

## 2011-02-11 MED ORDER — VINCRISTINE SULFATE CHEMO INJECTION 1 MG/ML
2.0000 mg | Freq: Once | INTRAVENOUS | Status: AC
  Administered 2011-02-11: 2 mg via INTRAVENOUS
  Filled 2011-02-11 (×2): qty 2

## 2011-02-11 MED ORDER — HEPARIN SOD (PORK) LOCK FLUSH 100 UNIT/ML IV SOLN
500.0000 [IU] | Freq: Once | INTRAVENOUS | Status: AC | PRN
  Administered 2011-02-11: 500 [IU]
  Filled 2011-02-11: qty 5

## 2011-02-11 MED ORDER — SODIUM CHLORIDE 0.9 % IV SOLN
800.0000 mg/m2 | Freq: Once | INTRAVENOUS | Status: AC
  Administered 2011-02-11: 1400 mg via INTRAVENOUS
  Filled 2011-02-11 (×2): qty 70

## 2011-02-11 MED ORDER — HEPARIN SOD (PORK) LOCK FLUSH 100 UNIT/ML IV SOLN
INTRAVENOUS | Status: AC
  Administered 2011-02-11: 500 [IU]
  Filled 2011-02-11: qty 5

## 2011-02-11 MED ORDER — ONDANSETRON HCL 4 MG/2ML IJ SOLN
Freq: Once | INTRAMUSCULAR | Status: AC
  Administered 2011-02-11: 16 mg via INTRAVENOUS
  Filled 2011-02-11: qty 8

## 2011-02-13 ENCOUNTER — Inpatient Hospital Stay (HOSPITAL_COMMUNITY): Payer: Medicare Other

## 2011-02-16 ENCOUNTER — Encounter (HOSPITAL_COMMUNITY): Payer: Self-pay | Admitting: General Surgery

## 2011-02-17 ENCOUNTER — Telehealth (HOSPITAL_COMMUNITY): Payer: Self-pay | Admitting: *Deleted

## 2011-02-17 NOTE — Telephone Encounter (Signed)
Message left for patient. I asked patient to call me if she needed to  - to let me know how she was doing since her first chemotherapy tx.

## 2011-02-18 ENCOUNTER — Telehealth (HOSPITAL_COMMUNITY): Payer: Self-pay

## 2011-02-18 NOTE — Telephone Encounter (Signed)
Message left to call clinic with any problems or concerns.

## 2011-02-19 ENCOUNTER — Encounter (HOSPITAL_BASED_OUTPATIENT_CLINIC_OR_DEPARTMENT_OTHER): Payer: Medicare Other | Admitting: Oncology

## 2011-02-19 ENCOUNTER — Ambulatory Visit (HOSPITAL_COMMUNITY): Payer: Medicare Other

## 2011-02-19 ENCOUNTER — Ambulatory Visit (HOSPITAL_COMMUNITY): Payer: Medicare Other | Admitting: Oncology

## 2011-02-19 DIAGNOSIS — C8581 Other specified types of non-Hodgkin lymphoma, lymph nodes of head, face, and neck: Secondary | ICD-10-CM

## 2011-02-19 DIAGNOSIS — R5381 Other malaise: Secondary | ICD-10-CM

## 2011-02-19 DIAGNOSIS — C8591 Non-Hodgkin lymphoma, unspecified, lymph nodes of head, face, and neck: Secondary | ICD-10-CM

## 2011-02-19 DIAGNOSIS — R110 Nausea: Secondary | ICD-10-CM

## 2011-02-19 DIAGNOSIS — R5383 Other fatigue: Secondary | ICD-10-CM

## 2011-02-19 LAB — CBC
Hemoglobin: 14.6 g/dL (ref 12.0–15.0)
MCH: 31.3 pg (ref 26.0–34.0)
MCHC: 33.7 g/dL (ref 30.0–36.0)
Platelets: 277 10*3/uL (ref 150–400)
RDW: 14.1 % (ref 11.5–15.5)

## 2011-02-19 LAB — URINALYSIS, ROUTINE W REFLEX MICROSCOPIC
Hgb urine dipstick: NEGATIVE
Nitrite: NEGATIVE
Protein, ur: NEGATIVE mg/dL
Urobilinogen, UA: 0.2 mg/dL (ref 0.0–1.0)

## 2011-02-19 LAB — DIFFERENTIAL
Basophils Absolute: 0.1 10*3/uL (ref 0.0–0.1)
Basophils Relative: 2 % — ABNORMAL HIGH (ref 0–1)
Eosinophils Absolute: 0.1 10*3/uL (ref 0.0–0.7)
Monocytes Absolute: 0.3 10*3/uL (ref 0.1–1.0)
Monocytes Relative: 5 % (ref 3–12)
Neutrophils Relative %: 79 % — ABNORMAL HIGH (ref 43–77)

## 2011-02-19 LAB — BASIC METABOLIC PANEL
Calcium: 10.1 mg/dL (ref 8.4–10.5)
GFR calc Af Amer: >60 mL/min (ref >60)
GFR calc non Af Amer: >60 mL/min (ref >60)
Glucose, Bld: 110 mg/dL — ABNORMAL HIGH (ref 70–99)
Potassium: 4.4 mEq/L (ref 3.5–5.1)
Sodium: 136 mEq/L (ref 135–145)

## 2011-02-19 MED ORDER — HEPARIN SOD (PORK) LOCK FLUSH 100 UNIT/ML IV SOLN
INTRAVENOUS | Status: AC
  Filled 2011-02-19: qty 5

## 2011-02-19 MED ORDER — SODIUM CHLORIDE 0.9 % IJ SOLN
10.0000 mL | Freq: Once | INTRAMUSCULAR | Status: AC
  Administered 2011-02-19: 10 mL via INTRAVENOUS
  Filled 2011-02-19: qty 10

## 2011-02-19 MED ORDER — HEPARIN SOD (PORK) LOCK FLUSH 100 UNIT/ML IV SOLN
500.0000 [IU] | Freq: Once | INTRAVENOUS | Status: AC
  Administered 2011-02-19: 500 [IU] via INTRAVENOUS
  Filled 2011-02-19: qty 5

## 2011-02-19 MED ORDER — SODIUM CHLORIDE 0.9 % IJ SOLN
INTRAMUSCULAR | Status: AC
  Administered 2011-02-19: 10 mL via INTRAVENOUS
  Filled 2011-02-19: qty 10

## 2011-02-19 NOTE — Patient Instructions (Signed)
[facility]  Discharge Instructions  RECOMMENDATIONS MADE BY THE CONSULTANT AND ANY TEST RESULTS WILL BE SENT TO YOUR REFERRING DOCTOR.   EXAM FINDINGS BY MD TODAY AND SIGNS AND SYMPTOMS TO REPORT TO CLINIC OR PRIMARY MD: You have atrial fibrillation which is the cause of your weakness.  Your lab results are fine.  MEDICATIONS PRESCRIBED: none   SPECIAL INSTRUCTIONS/FOLLOW-UP: Other (Referral/Appointments) As scheduled.   I acknowledge that I have been informed and understand all the instructions given to me and received a copy. I do not have any more questions at this time, but understand that I may call the Specialty Clinic at [facility] at [phone] during business hours should I have any further questions or need assistance in obtaining follow-up care.    __________________________________________  _____________  __________ Signature of Patient or Authorized Representative            Date                   Time    __________________________________________ Nurse's Signature

## 2011-02-19 NOTE — Progress Notes (Signed)
Sierra Good presented for [employer]. Labs per MD order drawn via Portacath located left chest wall accessed with  H 20 needle. Good blood return present.  Blood drawn for cbc/diff and bmet Procedure without incident. Port left accessed for potential IV fluids. 1320 Portacath flushed with 20ml NS and 500U/20ml Heparin and needle removed intact.  No IVs needed. Patient tolerated procedure well.

## 2011-02-19 NOTE — Progress Notes (Signed)
Sierra Hector, MD [address] [facility] [place] [address]  1. Lymphoma of lymph nodes of head, face, and/or neck  CBC, Basic metabolic panel, Urinalysis, Routine w reflex microscopic, EKG 12-Lead  2. Nausea  CBC, Basic metabolic panel, Urinalysis, Routine w reflex microscopic, EKG 12-Lead, Differential  3. Fatigue  CBC, Basic metabolic panel, Urinalysis, Routine w reflex microscopic, EKG 12-Lead, Differential    CURRENT THERAPY:S/P 1 cycle of Rituxan, Vincristine, and Cytoxan on 02/11/11 which is anticipated to be delivered avery 21 days.  INTERVAL HISTORY: Sierra Good 77 y.o. 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  The patient reports sudden onset of fatigue and weakness. She fell on Monday without any signs of trama.  She fortunately fell on her buttocks.    The patient admits to some nausea, but denies any vomiting.  She has no appetite presently.    The patient explains that she cannot perform tasks that she once could last week.  She is now using a wheelchair and walker when she was using a cane in the past for stabilization.  The patient's daughter is present in the exam room, and she has been staying with her.  She has a difficult time bathing now.  The patient was consuming 64 oz of fluids daily, but they have cut back on the fluid intake due to her weakness and difficulty getting to the restroom safely.  She remains continent of stool and bladder, but at night time she looses her ability to control her urine and wets her depends.  She denies any hematuria, urinary pain, burning or discomfort.  She denies any cough,  sputum production, nasal complaints, and feeling ill.   The patient is now spending a majority of her day in bed or in a recliner.    Past Medical History  Diagnosis Date  . GERD (gastroesophageal reflux disease)     takes omeprazole daily  . Hyperlipidemia   . Hypertension   . Osteoarthritis of lumbar spine   . PONV (postoperative nausea and vomiting) 2009    after total hip  . Deviated nasal septum     pt had septoplasty  . Nasal turbinate hypertrophy     bilateral  . Cancer     nasal cancer    has HYPERLIPIDEMIA; HYPERTENSION; ATRIAL FIBRILLATION; BRADYCARDIA; SYNCOPE, HX OF; and Lymphoma of lymph nodes of head, face, and/or neck on her problem list.     is allergic to risedronate sodium and statins.  Sierra Good does not currently have medications on file.  Past Surgical History  Procedure Date  . Total hip arthroplasty 2009    Right, [facility] (general)  . Bunionectomy 20+yrs ago    bilateral, [facility]  . Hammer toe surgery     20+ yrs ago  . Cataract extraction w/ intraocular lens  implant, bilateral 2010    [facility]  . Nasal septoplasty w/ turbinoplasty 12/18/2010    Procedure: NASAL SEPTOPLASTY WITH TURBINATE REDUCTION;  Surgeon: Darletta Moll;  Location: [facility];  Service: ENT;  Laterality: Bilateral;  . Colon surgery 2010    hemicoloctomy-flat polyp removed at [facility]  .  Portacath placement 02/10/2011    Procedure: INSERTION PORT-A-CATH;  Surgeon: Marlane Hatcher;  Location: [facility];  Service: General;  Laterality: Left;  left subclavian    Denies any headaches, dizziness, double vision, fevers, chills, night sweats, vomiting, diarrhea, constipation, chest pain, heart palpitations, shortness of breath, blood in stool, black tarry stool, urinary pain, urinary burning, urinary frequency, hematuria.   PHYSICAL EXAMINATION  ECOG PERFORMANCE STATUS: 3 - Symptomatic, >50% confined to bed  Filed Vitals:   02/19/11 1006  BP: 126/79  Pulse: 90  Temp: 97.4 F (36.3 C)     GENERAL:alert, no distress, well nourished, well developed, comfortable and cooperative SKIN: skin color, texture, turgor are normal HEAD: Normocephalic EYES: normal EARS: External ears normal OROPHARYNX:mucous membranes are moist  NECK: trachea midline LYMPH:  not examined BREAST:not examined LUNGS: clear to auscultation and percussion HEART: no murmurs, no gallops, irregularly irregular, S1 normal and S2 normal ABDOMEN:abdomen soft, non-tender and normal bowel sounds BACK: Back symmetric, no curvature., No CVA tenderness EXTREMITIES:less then 2 second capillary refill, no joint deformities, effusion, or inflammation, no edema, no skin discoloration, no clubbing, no cyanosis  NEURO: alert & oriented x 3 with fluent speech     RADIOGRAPHIC STUDIES:  02/10/11  *RADIOLOGY REPORT*  Clinical Data: Status post Port-A-Cath placement  PORTABLE CHEST - 1 VIEW  Comparison: 12/06/2007  Findings: Left subclavian chest port with its tip in the mid SVC.  No pneumothorax.  The lungs are essentially clear. No pleural effusion or  pneumothorax.  Mild cardiomegaly.  IMPRESSION:  Left subclavian chest port with its tip in the mid SVC. No  pneumothorax.  Mild cardiomegaly.  Original Report Authenticated By: Charline Bills, M.D.      01/16/11  *RADIOLOGY REPORT*  Clinical Data: History of lymphoma of the nasopharynx  MRI HEAD WITHOUT AND WITH CONTRAST  Technique: Multiplanar, multiecho pulse sequences of the brain and  surrounding structures were obtained according to standard protocol  without and with intravenous contrast  Contrast: Multihance 13 m.  Comparison: None.  Findings:  Gray-white matter differentiation is normal. The sella is normal.  Clival signal is intact. Cerebellar tonsils are just above the  level of the foramen magnum. The odontoid process, the predental  space and the prevertebral soft tissues are within normal limits.  Scattered subcentimeter lymph nodes  are seen in the posterior  triangle regions bilaterally.  A 12.8 mm x 10.5 mm enhancing soft tissue mass is seen projecting  posteriorly from the soft palate.  No diffusion weighted abnormalities noted.  Axial FLAIR and T2-weighted images demonstrate multiple focal areas  of signal hyperintensity in the subcortical white matter  bifrontally, the centrum semi ovale, the corona radiata , the  parietal subcortical white matter and the pons anteriorly.  The interspersed areas of signal void probably represent  perivascular spaces. Suggestion of an old lacuna is noted in the  right cerebellar hemisphere posterolaterally.  The ventricles are at the upper limits of normal .  Mastoids are clear. Internal auditory canals are symmetrical in  signal and morphology.  Flow voids are maintained in the major vessels at the cranial skull  base. Moderate inflammatory thickening of the mucosa in the  ethmoid air cells, the maxillary sinuses, the frontal sinuses and  to a lesser degree sphenoid sinus is seen.  Bilateral eye lens surgery is suggested in the orbits. No abnormal  blood breakdown products seen on SPGR sequences. Physiologic  mineralization suggested in the globus paladi.  No intracranial pathological enhancement.  Dural venous sinuses  are grossly patent.  IMPRESSION:  1. Intensely enhancing soft tissue mass in the region of the soft  palate measuring 12.8 mm x 10.5 mm probably representing the known  tumor.  2. No evidence of acute ischemia.  3. Nonspecific subcortical white matter changes supratentorially  and in the pons. These probably represent sequela of chronic  microvascular ischemic changes, with a demyelinating process or  vasculitis being most likely.  4. Remote right cerebellar lacunar infarct.  5. Moderate pan sinusitis changes.  Original Report Authenticated By: Oneal Grout, M.D.  01/20/11  *RADIOLOGY REPORT*  Clinical Data: Initial treatment strategy for  nasopharyngeal  lymphoma.  NUCLEAR MEDICINE PET CT INITIAL (PI) SKULL BASE TO THIGH  Technique: 15.8 mCi F-18 FDG was injected intravenously via the  left hand. Full-ring PET imaging was performed from the skull base  through the mid-thighs 50 minutes after injection. CT data was  obtained and used for attenuation correction and anatomic  localization only. (This was not acquired as a diagnostic CT  examination.)  Fasting Blood Glucose: 100  Patient Weight: 144 pounds.  Comparison: MR brain 01/16/2011.  Findings: There is hypermetabolism along the lateral margin of the  right nasal cavity, with an S U V max of 9.1 (fused image 13). A  10 mm right level II lymph node has an S U V max 3.4 (fused image  24). Finally, a lymph node adjacent to the right submandibular  gland measures 1.5 cm with an S U V max of 8.3 (fused image 31).  No additional areas of abnormal hypermetabolism in the neck, chest,  abdomen or pelvis.  CT images were performed for attenuation correction. Coronary  artery calcification. Heart is mildly enlarged. No pericardial or  pleural effusion. A 1.6 cm low attenuation lesion in the caudate  lobe of the liver is likely a cyst. Atherosclerotic calcification  of the arterial vasculature without aneurysm.  IMPRESSION:  1. Hypermetabolic mass along the right nasal cavity, with  hypermetabolic lymph nodes in the right neck.  2. Coronary artery calcification.  Original Report Authenticated By: Reyes Ivan, M.D.    ASSESSMENT:  1. Fatigue 2. Weakness 3. Decreased appetite 4. Nausea 5. Recent fall on Monday 02/16/11 without any signs/symptoms of trauma 6. Irregularly irregular HR   PLAN: 1. Lab work today: CBC diff, BMET 2. UA today, negative for UTI 3. EKG today, reveals Atrial Fibrillation 4. I personally reviewed and went over laboratory results with the patient. 5. Refer to Cardiologist 6. Full strength Aspirin 325 mg enteric coated per conversation with  Cardiology mid-level   All questions were answered. The patient knows to call the clinic with any problems, questions or concerns. We can certainly see the patient much sooner if necessary.  I spent 50 minutes counseling the patient face to face. The total time spent in the appointment was 60 minutes. More than 50% of the time spent with the patient was utilized for counseling.  I physically took the resulted EKG to the Cardiology group located at  [facility] and discussed the patient's case and EKG with the Nurse Practitioner.   Lilygrace Rodick

## 2011-02-20 LAB — HEPATITIS B CORE ANTIBODY, IGM: Hep B C IgM: NEGATIVE

## 2011-02-20 LAB — HEPATITIS B SURFACE ANTIGEN: Hepatitis B Surface Ag: NEGATIVE

## 2011-02-23 ENCOUNTER — Encounter (HOSPITAL_COMMUNITY): Payer: Self-pay | Admitting: Emergency Medicine

## 2011-02-23 ENCOUNTER — Other Ambulatory Visit: Payer: Self-pay

## 2011-02-23 ENCOUNTER — Emergency Department (HOSPITAL_COMMUNITY): Payer: Medicare Other

## 2011-02-23 ENCOUNTER — Inpatient Hospital Stay (HOSPITAL_COMMUNITY)
Admission: EM | Admit: 2011-02-23 | Discharge: 2011-02-27 | DRG: 809 | Disposition: A | Discharge status: Skilled Nursing Facility | Payer: Medicare Other | Attending: Internal Medicine | Admitting: Internal Medicine

## 2011-02-23 DIAGNOSIS — E860 Dehydration: Secondary | ICD-10-CM | POA: Diagnosis present

## 2011-02-23 DIAGNOSIS — D61818 Other pancytopenia: Principal | ICD-10-CM | POA: Diagnosis present

## 2011-02-23 DIAGNOSIS — C8591 Non-Hodgkin lymphoma, unspecified, lymph nodes of head, face, and neck: Secondary | ICD-10-CM | POA: Diagnosis present

## 2011-02-23 DIAGNOSIS — R0602 Shortness of breath: Secondary | ICD-10-CM

## 2011-02-23 DIAGNOSIS — K5900 Constipation, unspecified: Secondary | ICD-10-CM | POA: Diagnosis present

## 2011-02-23 DIAGNOSIS — C8581 Other specified types of non-Hodgkin lymphoma, lymph nodes of head, face, and neck: Secondary | ICD-10-CM | POA: Diagnosis present

## 2011-02-23 DIAGNOSIS — T451X5A Adverse effect of antineoplastic and immunosuppressive drugs, initial encounter: Secondary | ICD-10-CM | POA: Diagnosis present

## 2011-02-23 DIAGNOSIS — R112 Nausea with vomiting, unspecified: Secondary | ICD-10-CM | POA: Diagnosis present

## 2011-02-23 DIAGNOSIS — I4891 Unspecified atrial fibrillation: Secondary | ICD-10-CM | POA: Diagnosis present

## 2011-02-23 DIAGNOSIS — I10 Essential (primary) hypertension: Secondary | ICD-10-CM | POA: Diagnosis present

## 2011-02-23 DIAGNOSIS — IMO0001 Reserved for inherently not codable concepts without codable children: Secondary | ICD-10-CM

## 2011-02-23 DIAGNOSIS — R110 Nausea: Secondary | ICD-10-CM | POA: Diagnosis present

## 2011-02-23 DIAGNOSIS — E785 Hyperlipidemia, unspecified: Secondary | ICD-10-CM | POA: Diagnosis present

## 2011-02-23 LAB — COMPREHENSIVE METABOLIC PANEL
Alkaline Phosphatase: 101 U/L (ref 39–117)
BUN: 24 mg/dL — ABNORMAL HIGH (ref 6–23)
CO2: 26 mEq/L (ref 19–32)
Chloride: 95 mEq/L — ABNORMAL LOW (ref 96–112)
GFR calc Af Amer: >60 mL/min (ref >60)
Glucose, Bld: 132 mg/dL — ABNORMAL HIGH (ref 70–99)
Potassium: 3.7 mEq/L (ref 3.5–5.1)
Total Bilirubin: 0.5 mg/dL (ref 0.3–1.2)

## 2011-02-23 LAB — DIFFERENTIAL
Lymphocytes Relative: 31 % (ref 12–46)
Lymphs Abs: 0.5 10*3/uL — ABNORMAL LOW (ref 0.7–4.0)
Monocytes Relative: 34 % — ABNORMAL HIGH (ref 3–12)
Neutro Abs: 0.5 10*3/uL — ABNORMAL LOW (ref 1.7–7.7)
Neutrophils Relative %: 31 % — ABNORMAL LOW (ref 43–77)

## 2011-02-23 LAB — CBC
Hemoglobin: 15.3 g/dL — ABNORMAL HIGH (ref 12.0–15.0)
MCH: 31 pg (ref 26.0–34.0)
RBC: 4.94 MIL/uL (ref 3.87–5.11)

## 2011-02-23 LAB — POCT I-STAT TROPONIN I: Troponin i, poc: 0.01 ng/mL (ref 0.00–0.08)

## 2011-02-23 MED ORDER — PANTOPRAZOLE SODIUM 40 MG IV SOLR
40.0000 mg | Freq: Once | INTRAVENOUS | Status: AC
  Administered 2011-02-23: 40 mg via INTRAVENOUS
  Filled 2011-02-23: qty 40

## 2011-02-23 MED ORDER — HYDROMORPHONE HCL 1 MG/ML IJ SOLN
1.0000 mg | Freq: Once | INTRAMUSCULAR | Status: AC
  Administered 2011-02-23: 1 mg via INTRAVENOUS
  Filled 2011-02-23: qty 1

## 2011-02-23 MED ORDER — IOHEXOL 350 MG/ML SOLN
100.0000 mL | Freq: Once | INTRAVENOUS | Status: AC | PRN
  Administered 2011-02-23: 100 mL via INTRAVENOUS

## 2011-02-23 MED ORDER — ONDANSETRON HCL 4 MG/2ML IJ SOLN
4.0000 mg | Freq: Once | INTRAMUSCULAR | Status: AC
  Administered 2011-02-23: 4 mg via INTRAVENOUS
  Filled 2011-02-23: qty 2

## 2011-02-23 MED ORDER — SODIUM CHLORIDE 0.9 % IV SOLN
Freq: Once | INTRAVENOUS | Status: AC
  Administered 2011-02-23 (×2): via INTRAVENOUS

## 2011-02-23 NOTE — ED Notes (Signed)
Patient back to room from radiology. 7\10 epigatric pain. States it is from GERD. Into room to medicate patient. Denies any other needs. Call bell at bedside. No apparent distress.

## 2011-02-23 NOTE — ED Notes (Signed)
Attempted to call report. Nurse will call back for report. Patient and family made aware of patient room assignment.

## 2011-02-23 NOTE — ED Notes (Signed)
Into room to see patient. Resting laying on back. Pain 1\10 in epigastrium. Denies any needs at this time. Family at bedside. Call bell at bedside.

## 2011-02-23 NOTE — ED Notes (Signed)
Patients pain level 0\10. Denies any needs. Resting comfortably. To CT via stretcher.

## 2011-02-23 NOTE — ED Notes (Signed)
Beeped hospitalist to EDP's phone. 

## 2011-02-23 NOTE — ED Notes (Signed)
Pt back from xray, put back on monitor, nad noted

## 2011-02-23 NOTE — Progress Notes (Signed)
Addended by: Oda Kilts on: 02/23/2011 11:45 AM   Modules accepted: Orders

## 2011-02-23 NOTE — ED Notes (Signed)
MD at bedside with patient to discuss plan of care.

## 2011-02-23 NOTE — ED Notes (Signed)
MD at bedside to discuss plan of care

## 2011-02-23 NOTE — ED Notes (Signed)
Attempted to call report. Will call back for report. Patient resting in bed on back. No nausea or vomiting. Pain 3\10 in epigastrium. Denies needs. Notified patient and family awaiting to give report for admission. Verbalized understanding.

## 2011-02-23 NOTE — ED Notes (Addendum)
Into room to see patient. Remains resting in bed on back. Denies any needs at this time. Pain 6\10 in back. Having no shortness of breath. 98% on room air. Family notified MD states he will be in shortly. Verbalized understanding. Call bell at bedside. Will continue to monitor.

## 2011-02-23 NOTE — ED Notes (Signed)
Patient remains resting in bed on back. Denies nausea. Epigastric pain 3\10. Back pain 0\10. Breathing within normal limits. Equal chest rise and fall. Family at bedside. Denies any needs at this time. Call bell at bedside. Will continue to monitor.

## 2011-02-23 NOTE — ED Notes (Signed)
Very weak after chemo has been nauseated and vomiting since first chemo TX.  Has been SOB the last 8-10 days

## 2011-02-23 NOTE — ED Provider Notes (Addendum)
History   Scribed for Benny Lennert, MD, the patient was seen in room APA17/APA17. This chart was scribed by Clarita Crane. This patient's care was started at 4:21PM.  CSN: [id number] Arrival date & time: 02/23/2011  3:59 PM  Chief Complaint  Patient presents with  . Nausea  . Vomiting   HPI Sierra Good is a 77 y.o. female who presents to the Emergency Department complaining of constant nausea and vomiting with green emesis onset yesterday and persistent since. Denies abdominal pain, chest pain, fever chills. Patient notes falling prior to onset of symptoms several days ago but is unable to provide additional details regarding fall. 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 Patient with h/o GERD, hyperlipidemia, hypertension, osteoarthritis.   Oncologist- Dr. Mariel Sleet PCP- Dr. Lysbeth Galas  HPI ELEMENTS: Onset: several days ago Duration: persistent since onset  Timing: constant    Context:  as above  Associated symptoms: No additional symptoms.  Denies abdominal pain, chest pain, fever, chills.    PAST MEDICAL HISTORY:  Past Medical History  Diagnosis Date  . GERD (gastroesophageal reflux disease)     takes omeprazole daily  . Hyperlipidemia   . Hypertension   . Osteoarthritis of lumbar spine   . PONV (postoperative nausea and vomiting) 2009    after total hip  . Deviated nasal septum     pt had septoplasty  . Nasal turbinate hypertrophy     bilateral  . Cancer     nasal cancer    PAST SURGICAL HISTORY:  Past Surgical History  Procedure Date  . Total hip arthroplasty 2009    Right, [facility] (general)  . Bunionectomy 20+yrs ago    bilateral, [facility]  . Hammer toe surgery     20+ yrs ago  . Cataract extraction w/ intraocular lens  implant, bilateral 2010    [facility]  . Nasal septoplasty w/ turbinoplasty 12/18/2010    Procedure: NASAL  SEPTOPLASTY WITH TURBINATE REDUCTION;  Surgeon: Darletta Moll;  Location: [facility];  Service: ENT;  Laterality: Bilateral;  . Colon surgery 2010    hemicoloctomy-flat polyp removed at [facility]  . Portacath placement 02/10/2011    Procedure: INSERTION PORT-A-CATH;  Surgeon: Marlane Hatcher;  Location: [facility];  Service: General;  Laterality: Left;  left subclavian    FAMILY HISTORY:  Family History  Problem Relation Age of Onset  . Pseudochol deficiency Neg Hx   . Malignant hyperthermia Neg Hx   . Hypotension Neg Hx   . Anesthesia problems Neg Hx      SOCIAL HISTORY: History   Social History  . Marital Status: Widowed    Spouse Name: N/A    Number of Children: N/A  . Years of Education: N/A   Social History Main Topics  . Smoking status: Never Smoker   . Smokeless tobacco: Never Used  . Alcohol Use: No  . Drug Use: No  . Sexually Active: Yes    Birth Control/ Protection: Post-menopausal   Other Topics Concern  . None   Social History Narrative  . None     Review of Systems  Constitutional: Negative for fatigue.  HENT: Negative for congestion, sinus pressure and ear discharge.   Eyes: Negative for discharge.  Respiratory: Positive for shortness of breath. Negative for cough.   Cardiovascular: Negative for chest pain.  Gastrointestinal: Positive for nausea and vomiting. Negative  for abdominal pain and diarrhea.  Genitourinary: Negative for frequency and hematuria.  Musculoskeletal: Negative for back pain.  Skin: Negative for rash.  Neurological: Negative for seizures and headaches.  Hematological: Negative.   Psychiatric/Behavioral: Negative for hallucinations.    Allergies  Statins  Home Medications   Current Outpatient Rx  Name Route Sig Dispense Refill  . AMLODIPINE BESYLATE 5 MG PO TABS Oral Take 2.5 mg by mouth daily.     . ASPIRIN 325 MG PO TBEC Oral Take 325 mg by mouth daily.      Marland Kitchen HYDROCODONE-ACETAMINOPHEN 5-500 MG PO TABS Oral Take 1 tablet by mouth every  4 (four) hours as needed. For pain     . LANSOPRAZOLE 15 MG PO CPDR Oral Take 15 mg by mouth daily as needed.      Marland Kitchen MILK OF MAGNESIA PO Oral Take by mouth as needed.      . CENTRUM SILVER ULTRA WOMENS PO TABS Oral Take 1 tablet by mouth daily.      Marland Kitchen OMEPRAZOLE 20 MG PO CPDR Oral Take 20 mg by mouth daily.     Marland Kitchen ONDANSETRON HCL 8 MG PO TABS  Take 1 tablet two times a day starting the day after chemo for 3 days. Then take 1 tablet three times a day as needed for nausea or vomiting.      Marland Kitchen PITAVASTATIN CALCIUM 2 MG PO TABS Oral Take 2 mg by mouth daily.     Marland Kitchen PREDNISONE 50 MG PO TABS Oral Take 100 mg by mouth daily. For 5 days starting the day of Chemo therapy     . PROCHLORPERAZINE 25 MG RE SUPP Rectal Place 25 mg rectally every 6 (six) hours as needed. Unwrap and insert one suppository every 6 hours as needed for nausea and vomiting     . SENNOSIDES 8.6 MG PO TABS Oral Take 1 tablet by mouth every evening. As needed for relief     . ASPIRIN 81 MG PO TABS Oral Take 81 mg by mouth daily.      . COQ-10 PO Oral Take 1 tablet by mouth daily.      Marland Kitchen DOCUSATE SODIUM 50 MG PO CAPS Oral Take by mouth as needed.      Marland Kitchen LORAZEPAM 0.5 MG PO TABS Oral Take 0.5 mg by mouth every 3 (three) hours as needed. Take one tablet by mouth every 3 to 4 hours as needed for nausea and vomiting.     . MEGESTROL ACETATE 40 MG/ML PO SUSP Oral Take 800 mg by mouth daily. 4 tsp daily     . MULTIVITAMINS PO CAPS Oral Take 1 capsule by mouth daily.      Marland Kitchen PREDNISONE 20 MG PO TABS  100 mg( 5-20 mg tablets) a day for 5 days 25 tablet 2  . PROCHLORPERAZINE 25 MG RE SUPP Rectal Place 1 suppository (25 mg total) rectally every 12 (twelve) hours as needed for nausea. 12 suppository 3    Physical Exam    BP 137/83  Pulse 101  Temp(Src) 97.3 F (36.3 C) (Oral)  Resp 19  Ht 5\' 7"  (1.702 m)  Wt 140 lb (63.504 kg)  BMI 21.93 kg/m2  SpO2 94%  Physical Exam  Nursing note and vitals reviewed. Constitutional: She is oriented to  person, place, and time. She appears well-developed and well-nourished. No distress.  HENT:  Head: Normocephalic and atraumatic.  Mouth/Throat: Oropharynx is clear and moist.  Eyes: Conjunctivae are normal. Pupils are equal, round, and  reactive to light.  Neck: Neck supple.  Cardiovascular: Normal rate and regular rhythm.  Exam reveals no gallop and no friction rub.   No murmur heard. Pulmonary/Chest: Effort normal and breath sounds normal. She has no wheezes.  Abdominal: Soft. Bowel sounds are normal. She exhibits no distension. There is no tenderness.  Musculoskeletal: Normal range of motion. She exhibits no edema.  Neurological: She is alert and oriented to person, place, and time. No sensory deficit.  Skin: Skin is warm and dry.  Psychiatric: She has a normal mood and affect. Her behavior is normal.    ED Course  Procedures  OTHER DATA REVIEWED: Nursing notes, vital signs, and past medical records reviewed. Lab results reviewed and considered Imaging results reviewed and considered  DIAGNOSTIC STUDIES: Oxygen Saturation is 98% on room air, normal by my interpretation.    LABS / RADIOLOGY: Results for orders placed during the hospital encounter of 02/23/11  CBC      Component Value Range   WBC 1.5 (*) 4.0 - 10.5 (K/uL)   RBC 4.94  3.87 - 5.11 (MIL/uL)   Hemoglobin 15.3 (*) 12.0 - 15.0 (g/dL)   HCT 16.1  09.6 - 04.5 (%)   MCV 93.1  78.0 - 100.0 (fL)   MCH 31.0  26.0 - 34.0 (pg)   MCHC 33.3  30.0 - 36.0 (g/dL)   RDW 40.9  81.1 - 91.4 (%)   Platelets 303  150 - 400 (K/uL)  DIFFERENTIAL      Component Value Range   Neutrophils Relative 31 (*) 43 - 77 (%)   Neutro Abs 0.5 (*) 1.7 - 7.7 (K/uL)   Lymphocytes Relative 31  12 - 46 (%)   Lymphs Abs 0.5 (*) 0.7 - 4.0 (K/uL)   Monocytes Relative 34 (*) 3 - 12 (%)   Monocytes Absolute 0.5  0.1 - 1.0 (K/uL)   Eosinophils Relative 2  0 - 5 (%)   Eosinophils Absolute 0.0  0.0 - 0.7 (K/uL)   Basophils Relative 1  0 - 1 (%)    Basophils Absolute 0.0  0.0 - 0.1 (K/uL)  COMPREHENSIVE METABOLIC PANEL      Component Value Range   Sodium 136  135 - 145 (mEq/L)   Potassium 3.7  3.5 - 5.1 (mEq/L)   Chloride 95 (*) 96 - 112 (mEq/L)   CO2 26  19 - 32 (mEq/L)   Glucose, Bld 132 (*) 70 - 99 (mg/dL)   BUN 24 (*) 6 - 23 (mg/dL)   Creatinine, Ser 7.82  0.50 - 1.10 (mg/dL)   Calcium 9.9  8.4 - 95.6 (mg/dL)   Total Protein 7.1  6.0 - 8.3 (g/dL)   Albumin 4.0  3.5 - 5.2 (g/dL)   AST 16  0 - 37 (U/L)   ALT 15  0 - 35 (U/L)   Alkaline Phosphatase 101  39 - 117 (U/L)   Total Bilirubin 0.5  0.3 - 1.2 (mg/dL)   GFR calc non Af Amer 51 (*) >60 (mL/min)   GFR calc Af Amer >60  >60 (mL/min)  POCT I-STAT TROPONIN I      Component Value Range   Troponin i, poc 0.01  0.00 - 0.08 (ng/mL)   Comment 3            Ct Angio Chest W/cm &/or Wo Cm  02/23/2011  *RADIOLOGY REPORT*  Clinical Data:  77 year old female with shortness of breath. History of colon and nasal cancer.  CT ANGIOGRAPHY CHEST WITH CONTRAST  Technique:  Multidetector CT imaging of the chest was performed using the standard protocol during bolus administration of intravenous contrast.  Multiplanar CT image reconstructions including MIPs were obtained to evaluate the vascular anatomy.  Contrast:  100 ml intravenous Omnipaque-350  Comparison:  01/19/2011 PET-CT  Findings:  This is a technically satisfactory study.  No pulmonary emboli are identified. There is no evidence of thoracic aortic aneurysm.  Cardiomegaly and moderate coronary artery calcifications are again noted. There are no pleural or pericardial effusions. No enlarged lymph nodes are identified.  Fluid within the thoracic esophagus is identified - question reflux versus esophageal motility disorder.  There is no evidence of airspace disease, consolidation, suspicious nodule/mass or endobronchial/endotracheal lesions. Mild biapical pleuroparenchymal scarring is identified.  No acute or suspicious bony abnormalities are  identified. A small to moderate hiatal hernia is identified. A left-sided Port-A-Cath is identified with tip in the lower SVC.  Review of the MIP images confirms the above findings.  IMPRESSION: No evidence of acute abnormality - no evidence of pulmonary emboli or thoracic aortic aneurysm.  Hiatal hernia with fluid within the thoracic esophagus - question reflux versus esophageal motility disorder.  Cardiomegaly and coronary artery disease.  Original Report Authenticated By: Rosendo Gros, M.D.   Dg Abd Acute W/chest  02/23/2011  *RADIOLOGY REPORT*  Clinical Data: Nausea and vomiting.  ACUTE ABDOMEN SERIES (ABDOMEN 2 VIEW & CHEST 1 VIEW)  Comparison: None  Findings: The upright chest x-ray demonstrates a power port in good position.  The heart is normal in size.  No acute pulmonary findings.  Two views of the abdomen demonstrate scattered air and stool in the colon.  No distended small bowel loops to suggest obstruction.  The soft tissue shadows are maintained.  No free air.  The bony structures are unremarkable.  IMPRESSION:  1.  No acute cardiopulmonary findings. 2.  No plain film findings for an acute abdominal process.  Original Report Authenticated By: P. Loralie Champagne, M.D.   ED COURSE / COORDINATION OF CARE: Orders Placed This Encounter  Procedures  . DG Abd Acute W/Chest  . CT Angio Chest W/Cm &/Or Wo Cm  . CBC  . Differential  . Comprehensive metabolic panel  . POCT i-Stat troponin I  . ED EKG  7:40PM- Patient denies any additional episodes of vomiting. Family member provides additional history. States patient with last chem therapy treatment 2 weeks ago and schedule for an another treatment in 1 week. Family member also notes they are concerned with patient's worsening SOB over the past several days. Family states SOB is persistent even at rest although it is worsened by exertion. Patient denies having any episodes of chest pain with SOB. Family notes patient was evaluated in ED 4 days ago  and had an EKG performed which showed an abnormal rhythm.  9:44PM- Patient informed of further lab and imaging results. Informed of intent to admit to hospital. Explained processes that have been ruled out as a result of tests performed in ED. Patient and family agree with plan set forth at this time. 9:48PM- Consult complete with internal medicine. Patient case explained and discussed. Hospitalist agrees to evaluate patient in ED for possible admission.   ZOX:WRUEAVW, cardiac,  Anxiety.   PLAN: Admit  CONDITION ON Admission: Stable  MEDICATIONS GIVEN IN THE E.D.  Medications  0.9 %  sodium chloride infusion (not administered)  lansoprazole (PREVACID) 15 MG capsule (not administered)  Multiple Vitamins-Minerals (CENTRUM SILVER ULTRA WOMENS) TABS (not administered)  HYDROcodone-acetaminophen (VICODIN) 5-500 MG  per tablet (not administered)  predniSONE (DELTASONE) 50 MG tablet (not administered)  prochlorperazine (COMPRO) 25 MG suppository (not administered)  senna (SENOKOT) 8.6 MG tablet (not administered)  LORazepam (ATIVAN) 0.5 MG tablet (not administered)  ondansetron (ZOFRAN) injection 4 mg (4 mg Intravenous Given 02/23/11 1708)  HYDROmorphone (DILAUDID) injection 1 mg (1 mg Intravenous Given 02/23/11 1956)  pantoprazole (PROTONIX) injection 40 mg (40 mg Intravenous Given 02/23/11 2050)  iohexol (OMNIPAQUE) 350 MG/ML injection 100 mL (100 mL Intravenous Contrast Given 02/23/11 2052)      Date: 02/23/2011  Rate:86  Rhythm: atrial fibrillation  QRS Axis: normal  Intervals: normal  ST/T Wave abnormalities: nonspecific ST/T changes  Conduction Disutrbances:none  Narrative Interpretation:   Old EKG Reviewed: unchanged      Benny Lennert, MD 02/23/11 2204  Benny Lennert, MD 02/23/11 2205

## 2011-02-23 NOTE — ED Notes (Signed)
md made aware patient is having complaints of acid reflux and would like something for it.

## 2011-02-23 NOTE — ED Notes (Signed)
Report given to [employer], rn.

## 2011-02-23 NOTE — ED Notes (Signed)
Patient report given to Clinical research associate. Into room to see patient. Resting in bed on back. Denies nausea at this time. Denies any other episodes of vomiting since zofran administration. Family remains at bedside. Would like to see doctor. Denies any needs. IV fluids continue to infuse well on pump. Call bell at bedside.

## 2011-02-24 ENCOUNTER — Observation Stay (HOSPITAL_COMMUNITY): Payer: Medicare Other

## 2011-02-24 DIAGNOSIS — I517 Cardiomegaly: Secondary | ICD-10-CM

## 2011-02-24 LAB — CBC
HCT: 42 % (ref 36.0–46.0)
Hemoglobin: 14.3 g/dL (ref 12.0–15.0)
MCH: 31.6 pg (ref 26.0–34.0)
MCHC: 34 g/dL (ref 30.0–36.0)
MCV: 92.9 fL (ref 78.0–100.0)
RDW: 14.2 % (ref 11.5–15.5)

## 2011-02-24 LAB — COMPREHENSIVE METABOLIC PANEL
ALT: 13 U/L (ref 0–35)
AST: 14 U/L (ref 0–37)
Alkaline Phosphatase: 91 U/L (ref 39–117)
CO2: 28 mEq/L (ref 19–32)
GFR calc Af Amer: >60 mL/min (ref >60)
GFR calc non Af Amer: >60 mL/min (ref >60)
Glucose, Bld: 132 mg/dL — ABNORMAL HIGH (ref 70–99)
Potassium: 4.4 mEq/L (ref 3.5–5.1)
Sodium: 137 mEq/L (ref 135–145)

## 2011-02-24 LAB — VITAMIN B12: Vitamin B-12: 1418 pg/mL — ABNORMAL HIGH (ref 211–911)

## 2011-02-24 LAB — CARDIAC PANEL(CRET KIN+CKTOT+MB+TROPI)
Relative Index: INVALID (ref 0.0–2.5)
Troponin I: <0.3 ng/mL (ref <0.30)

## 2011-02-24 LAB — FOLATE: Folate: >20 ng/mL

## 2011-02-24 LAB — LIPASE, BLOOD: Lipase: 27 U/L (ref 11–59)

## 2011-02-24 MED ORDER — PANTOPRAZOLE SODIUM 40 MG PO TBEC
40.0000 mg | DELAYED_RELEASE_TABLET | Freq: Every day | ORAL | Status: DC
  Administered 2011-02-24 – 2011-02-27 (×4): 40 mg via ORAL
  Filled 2011-02-24 (×4): qty 1

## 2011-02-24 MED ORDER — LORAZEPAM 0.5 MG PO TABS
0.5000 mg | ORAL_TABLET | ORAL | Status: DC | PRN
  Administered 2011-02-24: 0.5 mg via ORAL
  Filled 2011-02-24: qty 1

## 2011-02-24 MED ORDER — MEGESTROL ACETATE 400 MG/10ML PO SUSP
800.0000 mg | Freq: Every day | ORAL | Status: DC
  Administered 2011-02-24 – 2011-02-27 (×4): 800 mg via ORAL
  Filled 2011-02-24 (×5): qty 20

## 2011-02-24 MED ORDER — ONDANSETRON HCL 4 MG/2ML IJ SOLN: 4.0000 mg | Freq: Four times a day (QID) | INTRAMUSCULAR | Status: DC | PRN

## 2011-02-24 MED ORDER — ENOXAPARIN SODIUM 40 MG/0.4ML ~~LOC~~ SOLN
40.0000 mg | SUBCUTANEOUS | Status: DC
  Administered 2011-02-24 – 2011-02-26 (×3): 40 mg via SUBCUTANEOUS
  Filled 2011-02-24 (×4): qty 0.4

## 2011-02-24 MED ORDER — HYDROCODONE-ACETAMINOPHEN 5-325 MG PO TABS
1.0000 | ORAL_TABLET | ORAL | Status: DC | PRN
  Administered 2011-02-24 – 2011-02-27 (×5): 1 via ORAL
  Filled 2011-02-24 (×5): qty 1

## 2011-02-24 MED ORDER — ONDANSETRON HCL 4 MG PO TABS
4.0000 mg | ORAL_TABLET | Freq: Four times a day (QID) | ORAL | Status: DC | PRN
  Administered 2011-02-24: 4 mg via ORAL
  Filled 2011-02-24: qty 1

## 2011-02-24 MED ORDER — LORAZEPAM 0.5 MG PO TABS
0.5000 mg | ORAL_TABLET | Freq: Four times a day (QID) | ORAL | Status: DC | PRN
  Administered 2011-02-24: 0.5 mg via ORAL
  Filled 2011-02-24: qty 1

## 2011-02-24 MED ORDER — AMLODIPINE BESYLATE 5 MG PO TABS
2.5000 mg | ORAL_TABLET | Freq: Every day | ORAL | Status: DC
  Administered 2011-02-24: 12:00:00 via ORAL
  Administered 2011-02-25 – 2011-02-27 (×3): 2.5 mg via ORAL
  Filled 2011-02-24 (×4): qty 1

## 2011-02-24 MED ORDER — SODIUM CHLORIDE 0.9 % IV SOLN: INTRAVENOUS | Status: AC

## 2011-02-24 NOTE — Progress Notes (Signed)
*  PRELIMINARY RESULTS* Echocardiogram 2D Echocardiogram has been performed.  Conrad [place] 02/24/2011, 2:06 PM

## 2011-02-24 NOTE — Plan of Care (Signed)
Problem: Consults Goal: General Medical Patient Education See Patient Education Module for specific education. Outcome: Completed/Met Date Met:  02/24/11 nausea

## 2011-02-24 NOTE — H&P (Signed)
  [id number] 

## 2011-02-24 NOTE — Progress Notes (Signed)
Physical Therapy Evaluation Patient Name: Sierra Good JYNWG'N Date: 02/24/2011 Time: 5621-3086 Charges: 1 eval Problem List:  Patient Active Problem List  Diagnoses  . HYPERLIPIDEMIA  . HYPERTENSION  . Atrial fibrillation  . BRADYCARDIA  . SYNCOPE, HX OF  . Lymphoma of lymph nodes of head, face, and/or neck  . Nausea alone  . SOB (shortness of breath) on exertion   Past Medical History:  Past Medical History  Diagnosis Date  . GERD (gastroesophageal reflux disease)     takes omeprazole daily  . Hyperlipidemia   . Hypertension   . Osteoarthritis of lumbar spine   . PONV (postoperative nausea and vomiting) 2009    after total hip  . Deviated nasal septum     pt had septoplasty  . Nasal turbinate hypertrophy     bilateral  . Cancer     nasal cancer  . Cancer     cancer in the nose and in lower jaw   Past Surgical History:  Past Surgical History  Procedure Date  . Total hip arthroplasty 2009    Right, [facility] (general)  . Bunionectomy 20+yrs ago    bilateral, [facility]  . Hammer toe surgery     20+ yrs ago  . Cataract extraction w/ intraocular lens  implant, bilateral 2010    [facility]  . Nasal septoplasty w/ turbinoplasty 12/18/2010    Procedure: NASAL SEPTOPLASTY WITH TURBINATE REDUCTION;  Surgeon: Darletta Moll;  Location: [facility];  Service: ENT;  Laterality: Bilateral;  . Colon surgery 2010    hemicoloctomy-flat polyp removed at [facility]  . Portacath placement 02/10/2011    Procedure: INSERTION PORT-A-CATH;  Surgeon: Marlane Hatcher;  Location: [facility];  Service: General;  Laterality: Left;  left subclavian    Precautions/Restrictions    Prior Functioning  Home Living Type of Home: House Lives With: Alone Home Layout: One level Home Access: Stairs to enter Entrance Stairs-Rails: Doctor, general practice of Steps: 3 Bathroom Shower/Tub: Health visitor: Handicapped height Home Adaptive Equipment: Bedside commode/3-in-1;Straight cane;Walker -  rolling Additional Comments: fell one week ago and did not do much walking Prior Function Level of Independence: Requires assistive device for independence;Independent with basic ADLs Driving: Yes Comments: has 2 daughters that live close by and able to provide some assistance. Pt reports getting an aide for assistance for home. Cognition Cognition Arousal/Alertness: Lethargic Sensation/Coordination   Extremity Assessment RLE Assessment RLE Assessment: Exceptions to [facility] RLE Strength RLE Overall Strength: Deficits (3/5) LLE Assessment LLE Assessment: Exceptions to [facility] LLE Strength LLE Overall Strength: Deficits (3/5) Mobility (including Balance) Bed Mobility Bed Mobility: Yes Left Sidelying to Sit: 3: Mod assist Left Sidelying to Sit Details (indicate cue type and reason): Cueing for hand placement Sitting - Scoot to Edge of Bed: 3: Mod assist Sitting - Scoot to Edge of Bed Details (indicate cue type and reason): Cueing for sequencing Transfers Transfers: Yes Sit to Stand: 3: Mod assist Sit to Stand Details (indicate cue type and reason): Cueing for hand sequence and body awareness Stand to Sit: 4: Min assist Stand to Sit Details: Cueing for LE placement  Ambulation/Gait Ambulation/Gait: Yes Ambulation/Gait Assistance: 3: Mod assist Ambulation Distance (Feet): 2 Feet Assistive device: Rolling walker Gait Pattern: Step-to pattern;Shuffle;Trunk flexed Stairs: No Wheelchair Mobility Wheelchair Mobility: No  Posture/Postural Control Posture/Postural Control: Postural limitations Postural Limitations: Unable to support self without UE support.  has significant decrease in core strength.  Unable to cross legs for LE dressing without mod A.  Mod  A to sit EOB w/o UE support Exercise  Total Joint Exercises Hip ABduction/ADduction: AROM;Both;5 reps;Seated Long Arc Quad: AROM;Both;10 reps;Seated General Exercises - Lower Extremity Long Arc Quad: AROM;Both;10 reps;Seated Hip  ABduction/ADduction: AROM;Both;5 reps;Seated Hip Flexion/Marching: AROM;Both;10 reps;Standing;Limitations Hip Flexion/Marching Limitations: Decreased ability to lift entire foot of the ground for both right and lef LE Low Level/ICU Exercises Hip ABduction/ADduction: AROM;Both;5 reps;Seated  End of Session PT - End of Session Equipment Utilized During Treatment: Gait belt Activity Tolerance: Patient limited by fatigue Patient left: in bed General Behavior During Session: Lethargic Cognition: WFL for tasks performed PT Assessment/Plan/Recommendation PT Assessment Clinical Impression Statement: Pt referred to PT secondary to generalized weakness and difficulty walking.  After examiniation it was found that the patient has current impairments of decreased activity tolerance, decreased strength, decreaed sitting balance and difficulty walking which are limiting her ability to safely return home with supervision.  PT Recommendation/Assessment: Patient will need skilled PT in the acute care venue PT Problem List: Decreased strength;Decreased activity tolerance;Decreased balance;Decreased mobility Barriers to Discharge: Decreased caregiver support PT Therapy Diagnosis : Difficulty walking;Generalized weakness PT Plan PT Frequency: Min 3X/week PT Treatment/Interventions: DME instruction;Gait training;Stair training;Functional mobility training;Therapeutic exercise;Balance training;Patient/family education PT Recommendation Follow Up Recommendations: Skilled nursing facility Equipment Recommended: Defer to next venue PT Goals  Acute Rehab PT Goals PT Goal Formulation: With patient Time For Goal Achievement: 5 days Pt will go Supine/Side to Sit: with mod assist Pt will Transfer Sit to Stand/Stand to Sit: with min assist Pt will Ambulate: 16 - 50 feet;with min assist;with least restrictive assistive device Sierra Good 02/24/2011, 1:56 PM

## 2011-02-24 NOTE — H&P (Signed)
NAMECATERA, HANKINS             ACCOUNT NO.:  [id number]  MEDICAL RECORD NO.:  [id number]  LOCATION:  A336                          FACILITY:  [facility]  PHYSICIAN:  Tarry Kos, MD       DATE OF BIRTH:  November 28, 1922  DATE OF ADMISSION:  02/23/2011 DATE OF DISCHARGE:  LH                             HISTORY & PHYSICAL   CHIEF COMPLAINT:  Nausea and vomiting.  HISTORY OF PRESENT ILLNESS:  Ms. Awad is an 77 year old female who was recently diagnosed with nasal lymphoma who received her first chemotherapy about 2 weeks ago.  She has been having nausea and with occasional vomiting since her chemotherapy 2 weeks ago.  The vomit has been nonbloody.  She denies any epigastric pain or chest pain.  She does have some shortness of breath, however the way that she found they found the lymphoma in her nose was she was having problems breathing through her nose and was short of breath.  So I am not sure how much the shortness of breath is really new or associated with her nasal issues. She currently does not appear short of breath, but she is mainly complaining of nausea.  She has Zofran at home which she does take, and it does help.  She was brought in by family members because of her persistent nausea.  She is mildly dehydrated.  Her electrolytes are all normal.  Again she denies any diarrhea and again she just had her first round of chemotherapy 2 weeks ago.  She is supposed to get it every 3 weeks.  She denies any fevers.  Again denies any abdominal pain.  Denies any lower extremity edema.  REVIEW OF SYSTEMS:  Otherwise negative.  PAST MEDICAL HISTORY: 1. Recently diagnosed nasal lymphoma just started her first     chemotherapy 2 weeks ago. 2. History of AFib. 3. Hypertension. 4. Hyperlipidemia. 5. History of bradycardia. 6. Non-Hodgkin's lymphoma to the above. 7. Status post Port-A-Cath placement.  Status post total hip surgery, bilateral cataract surgery colectomy in 2010 for  benign polyps.  Allergies to STATIN drugs.  SOCIAL HISTORY:  She is a nonsmoker.  No alcohol.  No IV drug abuse. She lives alone.  She currently has no Home Health physical therapy at home.  MEDICATIONS:  She has Zofran at home takes amlodipine, aspirin, Vicodin, Prevacid, milk of magnesia, multivitamin, Prilosec, Pitavastatin, prednisone 5 days after her chemo, __________ Senokot.  PHYSICAL EXAMINATION:  VITAL SIGNS:  Temperature is 97.5, pulse 90, respiration 20, blood pressure 135/85, 95% O2 sats on room air. GENERAL:  She is alert and oriented x4.  No apparent stress, friendly. HEENT:  Extraocular muscles intact.  Pupils are equal and reactive to light.  Oropharynx is clear.  Mucous membranes are moist. NECK:  No JVD.  No carotid bruits. COR:  Regular rate and rhythm without murmurs, rubs, or gallops. CHEST:  Clear to auscultation bilaterally.  No wheeze, rhonchi, or rales. ABDOMEN:  Soft, nontender, nondistended.  Positive bowel sounds.  No hepatosplenomegaly. EXTREMITIES:  No clubbing, cyanosis, or edema. PSYCHIATRIC:  Normal affect. NEUROLOGICAL:  No focal neurologic deficits. SKIN:  No rashes.  LABORATORY DATA:  Her electrolytes are normal.  BUN  and creatinine 24 and 1.03.  LFTs are normal.  Lipase not checked.  White count 1.5, hemoglobin is 15.3.  Differential is basically normal.  Troponin negative.  CTA negative for PE.  Acute abdominal series also hiatal hernia but no other acute abnormalities.  ASSESSMENT/PLAN:  This is an 77 year old female with nausea and vomiting likely secondary to side effects of chemotherapy. 1. Nausea and vomiting which is relieved by Zofran at home.  We will     continue Zofran here.  I am going to add on a lipase to her labs     and also check an abdominal ultrasound, however, her abdominal exam     is benign provide her some IV fluid overnight for some mild     dehydration.  We will place her in observation overnight, obtain a      physical therapy evaluation, and try to get her as much help at     home as we can. 2. Subjective shortness of breath.  We will rule out serial cardiac     enzymes and place her on telemetry.  Chest x-ray is negative.     There is no evidence of heart failure or pneumonia at this time. 3. Hypertension.  Continue her blood pressure medications. 4. Non-Hodgkin lymphoma.  She will need close followup with Oncology. 5. Further recommendation pending on overall hospital course.                                           ______________________________ Tarry Kos, MD     RD/MEDQ  D:  02/24/2011  T:  02/24/2011  Job:  [id number]

## 2011-02-24 NOTE — Progress Notes (Signed)
CRITICAL VALUE ALERT  Critical value received:  WBC 1.3  Date of notification:  02/24/11  Time of notification:  0405   Critical value read back: yes  Nurse who received alert:  Floreen Comber, RN  MD notified (1st page):  Onalee Hua  Time of first page:  0406  MD notified (2nd page):  Time of second page:  Responding MD:  Onalee Hua  Time MD responded:  [phone]

## 2011-02-24 NOTE — Progress Notes (Signed)
Patient was admitted after midnight. Chart reviewed, patient examined.  The patient's family report that the patient was brought to the emergency room for severe worsening weakness, malaise, and fatigue. Also she has been short of breath particularly at night and supine. They noticed that she sometimes has episodes where she seems to be gasping for air. The patient has no shortness of breath currently however. I will change to admission and check  TSH, B12, folate, echocardiogram. She has already had a CT angiogram of the chest. She had a lot of retained fluid in her esophagus and may be getting intermittently short of breath related to reflux-induced bronchospasm. If workup is negative, most likely explanation his chemotherapy and cancer. Patient is not currently nauseated and is currently eating lunch. Her lungs are clear and her vital signs and oxygenation were normal. She is on multiple medications at home that could be causing weakness malaise and fatigue. Will also check orthostatic vital signs.

## 2011-02-25 ENCOUNTER — Inpatient Hospital Stay (HOSPITAL_COMMUNITY): Payer: Medicare Other

## 2011-02-25 ENCOUNTER — Ambulatory Visit (HOSPITAL_COMMUNITY): Payer: Medicare Other | Admitting: Oncology

## 2011-02-25 ENCOUNTER — Other Ambulatory Visit (HOSPITAL_COMMUNITY): Payer: Self-pay | Admitting: Oncology

## 2011-02-25 DIAGNOSIS — D106 Benign neoplasm of nasopharynx: Secondary | ICD-10-CM

## 2011-02-25 LAB — CBC
HCT: 37 % (ref 36.0–46.0)
Hemoglobin: 12.8 g/dL (ref 12.0–15.0)
MCHC: 34.6 g/dL (ref 30.0–36.0)
RDW: 14.3 % (ref 11.5–15.5)
WBC: 3.1 10*3/uL — ABNORMAL LOW (ref 4.0–10.5)

## 2011-02-25 LAB — DIFFERENTIAL
Basophils Absolute: 0 10*3/uL (ref 0.0–0.1)
Eosinophils Absolute: 0.1 10*3/uL (ref 0.0–0.7)
Lymphocytes Relative: 26 % (ref 12–46)
Monocytes Relative: 27 % — ABNORMAL HIGH (ref 3–12)
Neutro Abs: 1.4 10*3/uL — ABNORMAL LOW (ref 1.7–7.7)
Neutrophils Relative %: 44 % (ref 43–77)

## 2011-02-25 NOTE — Plan of Care (Signed)
Problem: Phase II Progression Outcomes Goal: Progress activity as tolerated unless otherwise ordered Outcome: Progressing See PT evaluation and progress notes for details.      Goal: Discharge plan established Outcome: Progressing PT recommending SNF placement at d/c prior to returning home.

## 2011-02-25 NOTE — Progress Notes (Signed)
Subjective: Pt has occasional back pain relieed with pain meds. Denies cp, sob, abdominal pain.  Objective: Vital signs in last 24 hours: Filed Vitals:   02/25/11 1019 02/25/11 1424 02/25/11 1425 02/25/11 1426  BP: 124/79 111/70 110/69 117/69  Pulse:  96 107 97  Temp:  98.8 F (37.1 C)    TempSrc:      Resp:  18    Height:      Weight:      SpO2:  93%     Weight change:   Intake/Output Summary (Last 24 hours) at 02/25/11 1523 Last data filed at 02/25/11 0900  Gross per 24 hour  Intake    120 ml  Output      0 ml  Net    120 ml     General Appearance:    Alert, cooperative, no distress, appears stated age  Lungs:     Clear to auscultation bilaterally, respirations unlabored   Heart:    Regular rate and rhythm, S1 and S2 normal, no murmur, rub   or gallop  Abdomen:     Soft, non-tender, bowel sounds active all four quadrants,    no masses, no organomegaly  Extremities:   Extremities normal, atraumatic, no cyanosis or edema  Pulses:   2+ and symmetric all extremities  Skin:   Skin color, texture, turgor normal, no rashes or lesions    Lab Results: Results for orders placed during the hospital encounter of 02/23/11 (from the past 24 hour(s))  CBC     Status: Abnormal   Collection Time   02/25/11  5:34 AM      Component Value Range   WBC 3.1 (*) 4.0 - 10.5 (K/uL)   RBC 3.98  3.87 - 5.11 (MIL/uL)   Hemoglobin 12.8  12.0 - 15.0 (g/dL)   HCT 16.1  09.6 - 04.5 (%)   MCV 93.0  78.0 - 100.0 (fL)   MCH 32.2  26.0 - 34.0 (pg)   MCHC 34.6  30.0 - 36.0 (g/dL)   RDW 40.9  81.1 - 91.4 (%)   Platelets 183  150 - 400 (K/uL)  DIFFERENTIAL     Status: Abnormal   Collection Time   02/25/11  5:34 AM      Component Value Range   Neutrophils Relative 44  43 - 77 (%)   Lymphocytes Relative 26  12 - 46 (%)   Monocytes Relative 27 (*) 3 - 12 (%)   Eosinophils Relative 2  0 - 5 (%)   Basophils Relative 1  0 - 1 (%)   Neutro Abs 1.4 (*) 1.7 - 7.7 (K/uL)   Lymphs Abs 0.8  0.7 - 4.0  (K/uL)   Monocytes Absolute 0.8  0.1 - 1.0 (K/uL)   Eosinophils Absolute 0.1  0.0 - 0.7 (K/uL)   Basophils Absolute 0.0  0.0 - 0.1 (K/uL)   Micro Results: No results found for this or any previous visit (from the past 240 hour(s)). Studies/Results: Ct Angio Chest W/cm &/or Wo Cm  02/23/2011  *RADIOLOGY REPORT* .  IMPRESSION: No evidence of acute abnormality - no evidence of pulmonary emboli or thoracic aortic aneurysm.  Hiatal hernia with fluid within the thoracic esophagus - question reflux versus esophageal motility disorder.  Cardiomegaly and coronary artery disease.  Original Report Authenticated By: Rosendo Gros, M.D.   US Abdomen Complete  02/24/2011  *RADIOLOGY REPORT*  Clinical Data:  Nausea, past history of carcinoma of the colon  ULTRASOUND ABDOMEN:  Technique:  Sonography  of upper abdominal structures was performed.  Comparison:  None Correlation:  CT chest 02/23/2011, PET CT 01/19/2011  Gallbladder:  Normally distended without stones or wall thickening. No pericholecystic fluid or sonographic Murphy sign.  Common bile duct:  Normal caliber 4 mm diameter  Liver:  Complicated cystic lesion within the caudate, 1.4 x 1.3 x 1.7 cm, also noted on prior CT.  No additional hepatic mass. Parenchymal echogenicity normal.  No intrahepatic biliary dilatation.  IVC:  Normal appearance  Pancreas:  Normal appearance  Spleen:  Normal appearance, 5.8 cm length.  Right kidney:  9.9 cm length.  Cortical thinning.  No gross mass or hydronephrosis.  Left kidney:  9.6 cm length.  Cortical thinning.  No gross mass or hydronephrosis.  Aorta:  Normal caliber  Other:  No free fluid  IMPRESSION: Small complicated cyst within caudate lobe of liver, noted on prior CT exams. No acute sonographic abnormalities identified.  Original Report Authenticated By: Lollie Marrow, M.D.   Dg Abd Acute W/chest  02/23/2011  *RADIOLOGY REPORT*  Clinical Data: Nausea and vomiting.  ACUTE ABDOMEN SERIES (ABDOMEN 2 VIEW & CHEST 1 VIEW)   Comparison: None  Findings: The upright chest x-ray demonstrates a power port in good position.  The heart is normal in size.  No acute pulmonary findings.  Two views of the abdomen demonstrate scattered air and stool in the colon.  No distended small bowel loops to suggest obstruction.  The soft tissue shadows are maintained.  No free air.  The bony structures are unremarkable.  IMPRESSION:  1.  No acute cardiopulmonary findings. 2.  No plain film findings for an acute abdominal process.  Original Report Authenticated By: P. Loralie Champagne, M.D.   Medications: I have reviewed the patient's current medications. Scheduled Meds:   . amLODipine  2.5 mg Oral Daily  . enoxaparin  40 mg Subcutaneous Q24H  . megestrol  800 mg Oral Daily  . pantoprazole  40 mg Oral Daily   Continuous Infusions:  PRN Meds:.HYDROcodone-acetaminophen, LORazepam, ondansetron (ZOFRAN) IV, ondansetron Assessment/Plan: Active Problems:  Atrial fibrillation Rate controlled, not on any meds currently.  Lymphoma of lymph nodes of head, face, and/or neck s/p chemo recently.  Pancytopenia improving.   Nausea resolved.   SOB (shortness of breath) on exertion improved.  Fever over teh last 24hrs. Will get a repeat cxr, and blood cultures.   LOS: 2 days   Annakate Soulier 02/25/2011, 3:23 PM

## 2011-02-25 NOTE — Progress Notes (Signed)
Physical Therapy Treatment Patient Name: Sierra Good Date: 02/25/2011 Problem List:  Patient Active Problem List  Diagnoses  . HYPERLIPIDEMIA  . HYPERTENSION  . Atrial fibrillation  . BRADYCARDIA  . SYNCOPE, HX OF  . Lymphoma of lymph nodes of head, face, and/or neck  . Nausea alone  . SOB (shortness of breath) on exertion   Past Medical History:  Past Medical History  Diagnosis Date  . GERD (gastroesophageal reflux disease)     takes omeprazole daily  . Hyperlipidemia   . Hypertension   . Osteoarthritis of lumbar spine   . PONV (postoperative nausea and vomiting) 2009    after total hip  . Deviated nasal septum     pt had septoplasty  . Nasal turbinate hypertrophy     bilateral  . Cancer     nasal cancer  . Cancer     cancer in the nose and in lower jaw   Past Surgical History:  Past Surgical History  Procedure Date  . Total hip arthroplasty 2009    Right, [facility] (general)  . Bunionectomy 20+yrs ago    bilateral, [facility]  . Hammer toe surgery     20+ yrs ago  . Cataract extraction w/ intraocular lens  implant, bilateral 2010    [facility]  . Nasal septoplasty w/ turbinoplasty 12/18/2010    Procedure: NASAL SEPTOPLASTY WITH TURBINATE REDUCTION;  Surgeon: Darletta Moll;  Location: [facility];  Service: ENT;  Laterality: Bilateral;  . Colon surgery 2010    hemicoloctomy-flat polyp removed at [facility]  . Portacath placement 02/10/2011    Procedure: INSERTION PORT-A-CATH;  Surgeon: Marlane Hatcher;  Location: [facility];  Service: General;  Laterality: Left;  left subclavian   Precautions/Restrictions  Precautions Precautions:  (neutropenic) Precaution Comments: mask in room Restrictions Weight Bearing Restrictions: No Mobility (including Balance) Bed Mobility Bed Mobility: Yes Supine to Sit: 3: Mod assist Supine to Sit Details (indicate cue type and reason): mod assist of trunk to get to EOB.  Pt able to get legs EOB independently.   Sitting - Scoot to Edge of Bed: 3: Mod  assist;With rail Sitting - Scoot to [place] of Bed Details (indicate cue type and reason): mod assist to weight shift hips to EOB-PT assisting with draw pad.   Transfers Transfers: Yes Sit to Stand: 3: Mod assist;With upper extremity assist;With armrests;From bed;From chair/3-in-1 Sit to Stand Details (indicate cue type and reason): mod assist to support trunk and anterior weight shift.  Pt tends to LOB posteriorly.   Stand to Sit: 3: Mod assist;With upper extremity assist;With armrests;To bed;To chair/3-in-1 Stand to Sit Details: mod assist to control descent to sit.   Stand Pivot Transfers: With armrests (x3 bed to 3-in-1, 3-in-1 to bed, bed to recliner) Ambulation/Gait Ambulation/Gait: No (pt too fatigued after multiple transfers.  )    Exercise  Total Joint Exercises Long Arc Quad: AROM;Both;10 reps;Seated General Exercises - Upper Extremity Shoulder Flexion: AROM;Both;10 reps;Seated General Exercises - Lower Extremity Long Arc Quad: AROM;Both;10 reps;Seated Hip Flexion/Marching: AROM;Both;10 reps;Seated Hip Flexion/Marching Limitations: decreased hip flexion strength with this exercise bilaterally.   Toe Raises: AROM;Both;10 reps;Seated Heel Raises: AROM;Both;10 reps;Seated Low Level/ICU Exercises Shoulder Flexion: AROM;Both;10 reps;Seated  End of Session PT - End of Session Activity Tolerance: Patient limited by fatigue Patient left: in chair;with call bell in reach General Behavior During Session: [facility] for tasks performed Cognition: [facility] for tasks performed PT Assessment/Plan  PT - Assessment/Plan Comments on Treatment Session: patient fatigues quickly and got  lightheaded EOB.  Lightheadedness passes after approx 2 minutes seated.  Patient able to continue with therapy.  Will need to attempt gait in room with RW next treatment.   PT Plan: Discharge plan remains appropriate PT Frequency: Min 3X/week Follow Up Recommendations: Skilled nursing facility Equipment Recommended:  Defer to next venue PT Goals  Acute Rehab PT Goals PT Goal Formulation: With patient Time For Goal Achievement: 5 days Pt will go Supine/Side to Sit: with mod assist PT Goal: Supine/Side to Sit - Progress: Met Pt will Transfer Sit to Stand/Stand to Sit: with min assist PT Transfer Goal: Sit to Stand/Stand to Sit - Progress: Progressing toward goal Pt will Ambulate: 16 - 50 feet;with min assist;with least restrictive assistive device PT Goal: Ambulate - Progress:  (not tested secondary to fatigue today.  )  Beckham Capistran, Claretta Fraise 02/25/2011, 3:30 PM

## 2011-02-25 NOTE — Consult Note (Addendum)
[facility] Consultation Oncology  Name: Sierra Good      MRN: [id number]    Location: N829/F621-30  Date: 02/25/2011 Time:5:41 PM   REFERRING PHYSICIAN:  Crista Curb, MD  REASON FOR CONSULT:  Pancytopenia and nasopharyngeal lymphoma   DIAGNOSIS:  Nasopharyngeal lymphoma  HISTORY OF PRESENT ILLNESS:    This is an 77 year old Caucasian female who is well known to the [facility] who was recently diagnosed with nasopharyngeal lymphoma.  She is S/P 1 cycle of chemotherapy.  She is admitted for fatigue, weakness, and pancytopenia.  The patient was seen in the [facility] last week prior to admission for sudden onset of weakness and fatigue 2-3 days prior.  Lab work was ordered which was unremarkable.  A UA was performed and that too was unremarkable.  An EKG was performed which revealed A-Fib with a rate of 89.  The patient was then released to go home with an appointment for follow-up with cardiology and encouraged to take a 325 mg enteric coated aspirin.  The patient's counts have now recovered sufficiently and neutropenic precautions will therefore be discontinued.  The patient reports that she feels better today compared to yesterday.  PAST MEDICAL HISTORY:   Past Medical History  Diagnosis Date  . GERD (gastroesophageal reflux disease)     takes omeprazole daily  . Hyperlipidemia   . Hypertension   . Osteoarthritis of lumbar spine   . PONV (postoperative nausea and vomiting) 2009    after total hip  . Deviated nasal septum     pt had septoplasty  . Nasal turbinate hypertrophy     bilateral  . Cancer     nasal cancer  . Cancer     cancer in the nose and in lower jaw    ALLERGIES: Allergies  Allergen Reactions  . Statins Other (See Comments)    Causes leg cramps and muscle weakness      MEDICATIONS: I have reviewed the patient's current medications.     PAST SURGICAL HISTORY Past Surgical History  Procedure Date  . Total  hip arthroplasty 2009    Right, [facility] (general)  . Bunionectomy 20+yrs ago    bilateral, [facility]  . Hammer toe surgery     20+ yrs ago  . Cataract extraction w/ intraocular lens  implant, bilateral 2010    [facility]  . Nasal septoplasty w/ turbinoplasty 12/18/2010    Procedure: NASAL SEPTOPLASTY WITH TURBINATE REDUCTION;  Surgeon: Darletta Moll;  Location: [facility] ORS;  Service: ENT;  Laterality: Bilateral;  . Colon surgery 2010    hemicoloctomy-flat polyp removed at [facility]  . Portacath placement 02/10/2011    Procedure: INSERTION PORT-A-CATH;  Surgeon: Marlane Hatcher;  Location: [facility] ORS;  Service: General;  Laterality: Left;  left subclavian    FAMILY HISTORY: Family History  Problem Relation Age of Onset  . Pseudochol deficiency Neg Hx   . Malignant hyperthermia Neg Hx   . Hypotension Neg Hx   . Anesthesia problems Neg Hx     SOCIAL HISTORY:  reports that she has never smoked. She has never used smokeless tobacco. She reports that she does not drink alcohol or use illicit drugs.  PERFORMANCE STATUS: The patient's performance status is 3 - Symptomatic, >50% confined to bed  PHYSICAL EXAM: Most Recent Vital Signs: Blood pressure 117/69, pulse 97, temperature 98.8 F (37.1 C), temperature source Oral, resp. rate 18, height 5\' 6"  (1.676 m), weight 63.7 kg (140 lb  6.9 oz), SpO2 93.00%. BP 117/69  Pulse 97  Temp(Src) 98.8 F (37.1 C) (Oral)  Resp 18  Ht 5\' 6"  (1.676 m)  Wt 63.7 kg (140 lb 6.9 oz)  BMI 22.67 kg/m2  SpO2 93%      General:  Patient seen laying in bed with 3 family members at the bedside.  The patient reports feeling better today.   Normocephalic, Atraumatic.  Further PE deferred at this time.                                                         LABORATORY DATA:  Results for orders placed during the hospital encounter of 02/23/11 (from the past 48 hour(s))  POCT I-STAT TROPONIN I     Status: Normal   Collection Time   02/23/11  8:11 PM      Component Value  Range Comment   Troponin i, poc 0.01  0.00 - 0.08 (ng/mL)    Comment 3            CARDIAC PANEL(CRET KIN+CKTOT+MB+TROPI)     Status: Normal   Collection Time   02/24/11  3:00 AM      Component Value Range Comment   Total CK 24  7 - 177 (U/L)    CK, MB 2.3  0.3 - 4.0 (ng/mL)    Troponin I <0.30  <0.30 (ng/mL)    Relative Index RELATIVE INDEX IS INVALID  0.0 - 2.5    CBC     Status: Abnormal   Collection Time   02/24/11  3:08 AM      Component Value Range Comment   WBC 1.3 (*) 4.0 - 10.5 (K/uL)    RBC 4.52  3.87 - 5.11 (MIL/uL)    Hemoglobin 14.3  12.0 - 15.0 (g/dL)    HCT 44.0  34.7 - 42.5 (%)    MCV 92.9  78.0 - 100.0 (fL)    MCH 31.6  26.0 - 34.0 (pg)    MCHC 34.0  30.0 - 36.0 (g/dL)    RDW 95.6  38.7 - 56.4 (%)    Platelets 275  150 - 400 (K/uL)   CREATININE, SERUM     Status: Normal   Collection Time   02/24/11  3:08 AM      Component Value Range Comment   Creatinine, Ser 0.85  0.50 - 1.10 (mg/dL)    GFR calc non Af Amer >60  >60 (mL/min)    GFR calc Af Amer >60  >60 (mL/min)   PRO B NATRIURETIC PEPTIDE     Status: Abnormal   Collection Time   02/24/11  3:08 AM      Component Value Range Comment   BNP, POC 472.9 (*) 0 - 450 (pg/mL)   LIPASE, BLOOD     Status: Normal   Collection Time   02/24/11  3:08 AM      Component Value Range Comment   Lipase 27  11 - 59 (U/L)   COMPREHENSIVE METABOLIC PANEL     Status: Abnormal   Collection Time   02/24/11  3:08 AM      Component Value Range Comment   Sodium 137  135 - 145 (mEq/L)    Potassium 4.4  3.5 - 5.1 (mEq/L)    Chloride 98  96 - 112 (mEq/L)  CO2 28  19 - 32 (mEq/L)    Glucose, Bld 132 (*) 70 - 99 (mg/dL)    BUN 25 (*) 6 - 23 (mg/dL)    Creatinine, Ser 1.61  0.50 - 1.10 (mg/dL)    Calcium 9.3  8.4 - 10.5 (mg/dL)    Total Protein 6.2  6.0 - 8.3 (g/dL)    Albumin 3.4 (*) 3.5 - 5.2 (g/dL)    AST 14  0 - 37 (U/L)    ALT 13  0 - 35 (U/L)    Alkaline Phosphatase 91  39 - 117 (U/L)    Total Bilirubin 0.6  0.3 - 1.2  (mg/dL)    GFR calc non Af Amer >60  >60 (mL/min)    GFR calc Af Amer >60  >60 (mL/min)   TSH     Status: Normal   Collection Time   02/24/11  1:19 PM      Component Value Range Comment   TSH 2.016  0.350 - 4.500 (uIU/mL)   VITAMIN B12     Status: Abnormal   Collection Time   02/24/11  1:19 PM      Component Value Range Comment   Vitamin B-12 1418 (*) 211 - 911 (pg/mL)   FOLATE     Status: Normal   Collection Time   02/24/11  1:19 PM      Component Value Range Comment   Folate >20.0     CBC     Status: Abnormal   Collection Time   02/25/11  5:34 AM      Component Value Range Comment   WBC 3.1 (*) 4.0 - 10.5 (K/uL)    RBC 3.98  3.87 - 5.11 (MIL/uL)    Hemoglobin 12.8  12.0 - 15.0 (g/dL)    HCT 09.6  04.5 - 40.9 (%)    MCV 93.0  78.0 - 100.0 (fL)    MCH 32.2  26.0 - 34.0 (pg)    MCHC 34.6  30.0 - 36.0 (g/dL)    RDW 81.1  91.4 - 78.2 (%)    Platelets 183  150 - 400 (K/uL) DELTA CHECK NOTED  DIFFERENTIAL     Status: Abnormal   Collection Time   02/25/11  5:34 AM      Component Value Range Comment   Neutrophils Relative 44  43 - 77 (%)    Lymphocytes Relative 26  12 - 46 (%)    Monocytes Relative 27 (*) 3 - 12 (%)    Eosinophils Relative 2  0 - 5 (%)    Basophils Relative 1  0 - 1 (%)    Neutro Abs 1.4 (*) 1.7 - 7.7 (K/uL)    Lymphs Abs 0.8  0.7 - 4.0 (K/uL)    Monocytes Absolute 0.8  0.1 - 1.0 (K/uL)    Eosinophils Absolute 0.1  0.0 - 0.7 (K/uL)    Basophils Absolute 0.0  0.0 - 0.1 (K/uL)       RADIOGRAPHY: Dg Chest 2 View  02/25/2011  *RADIOLOGY REPORT*  Clinical Data: Fevers  CHEST - 2 VIEW  Comparison: 02/23/2011  Findings:  There is a left chest wall porta-catheter with tip in the SVC.  Heart size is normal.  No pleural effusion or pulmonary edema.  No airspace consolidation is identified.  Increased lobe volumes and coarsened interstitial markings suggest mild COPD.  Review of the visualized osseous structures is unremarkable.  IMPRESSION:  1.  No acute cardiopulmonary  abnormalities. 2.  Suspect COPD.  Original Report  Authenticated By: Rosealee Albee, M.D.   Ct Angio Chest W/cm &/or Wo Cm  02/23/2011  *RADIOLOGY REPORT*  Clinical Data:  77 year old female with shortness of breath. History of colon and nasal cancer.  CT ANGIOGRAPHY CHEST WITH CONTRAST  Technique:  Multidetector CT imaging of the chest was performed using the standard protocol during bolus administration of intravenous contrast.  Multiplanar CT image reconstructions including MIPs were obtained to evaluate the vascular anatomy.  Contrast:  100 ml intravenous Omnipaque-350  Comparison:  01/19/2011 PET-CT  Findings:  This is a technically satisfactory study.  No pulmonary emboli are identified. There is no evidence of thoracic aortic aneurysm.  Cardiomegaly and moderate coronary artery calcifications are again noted. There are no pleural or pericardial effusions. No enlarged lymph nodes are identified.  Fluid within the thoracic esophagus is identified - question reflux versus esophageal motility disorder.  There is no evidence of airspace disease, consolidation, suspicious nodule/mass or endobronchial/endotracheal lesions. Mild biapical pleuroparenchymal scarring is identified.  No acute or suspicious bony abnormalities are identified. A small to moderate hiatal hernia is identified. A left-sided Port-A-Cath is identified with tip in the lower SVC.  Review of the MIP images confirms the above findings.  IMPRESSION: No evidence of acute abnormality - no evidence of pulmonary emboli or thoracic aortic aneurysm.  Hiatal hernia with fluid within the thoracic esophagus - question reflux versus esophageal motility disorder.  Cardiomegaly and coronary artery disease.  Original Report Authenticated By: Rosendo Gros, M.D.   US Abdomen Complete  02/24/2011  *RADIOLOGY REPORT*  Clinical Data:  Nausea, past history of carcinoma of the colon  ULTRASOUND ABDOMEN:  Technique:  Sonography of upper abdominal structures was  performed.  Comparison:  None Correlation:  CT chest 02/23/2011, PET CT 01/19/2011  Gallbladder:  Normally distended without stones or wall thickening. No pericholecystic fluid or sonographic Murphy sign.  Common bile duct:  Normal caliber 4 mm diameter  Liver:  Complicated cystic lesion within the caudate, 1.4 x 1.3 x 1.7 cm, also noted on prior CT.  No additional hepatic mass. Parenchymal echogenicity normal.  No intrahepatic biliary dilatation.  IVC:  Normal appearance  Pancreas:  Normal appearance  Spleen:  Normal appearance, 5.8 cm length.  Right kidney:  9.9 cm length.  Cortical thinning.  No gross mass or hydronephrosis.  Left kidney:  9.6 cm length.  Cortical thinning.  No gross mass or hydronephrosis.  Aorta:  Normal caliber  Other:  No free fluid  IMPRESSION: Small complicated cyst within caudate lobe of liver, noted on prior CT exams. No acute sonographic abnormalities identified.  Original Report Authenticated By: Lollie Marrow, M.D.   Dg Abd Acute W/chest  02/23/2011  *RADIOLOGY REPORT*  Clinical Data: Nausea and vomiting.  ACUTE ABDOMEN SERIES (ABDOMEN 2 VIEW & CHEST 1 VIEW)  Comparison: None  Findings: The upright chest x-ray demonstrates a power port in good position.  The heart is normal in size.  No acute pulmonary findings.  Two views of the abdomen demonstrate scattered air and stool in the colon.  No distended small bowel loops to suggest obstruction.  The soft tissue shadows are maintained.  No free air.  The bony structures are unremarkable.  IMPRESSION:  1.  No acute cardiopulmonary findings. 2.  No plain film findings for an acute abdominal process.  Original Report Authenticated By: P. Loralie Champagne, M.D.         ASSESSMENT:  1. Nasopharyngeal lymphoma, S/P 1 cycle of chemotherapy 2. Neutropenia, improving,  nearly resolved 3. Weakness 4. Fatigue 5. Atrial Fibrillation  PLAN:  1. Referral for Cardiology for consultation of Atrial Fibrillation and clearance for continuation of  chemotherapy for a cardiology standpoint. 2. D/C neutropenic precautions 3. Consider a dose reduction of chemotherapy and introduction of neulasta support. 4. Will consider deferment of chemotherapy pending her recovery status. 5. Will continue to follow and assist in patient care as deemed fit.  All questions were answered. The patient knows to call the clinic with any problems, questions or concerns. We can certainly see the patient much sooner if necessary.  The patient seen with  Glenford Peers, MD and he is in agreement with the aforementioned.  KEFALAS,THOMAS

## 2011-02-26 DIAGNOSIS — D106 Benign neoplasm of nasopharynx: Secondary | ICD-10-CM

## 2011-02-26 LAB — CBC
HCT: 40.3 % (ref 36.0–46.0)
Hemoglobin: 13.8 g/dL (ref 12.0–15.0)
RBC: 4.35 MIL/uL (ref 3.87–5.11)
WBC: 6.4 10*3/uL (ref 4.0–10.5)

## 2011-02-26 LAB — DIFFERENTIAL
Band Neutrophils: 50 % — ABNORMAL HIGH (ref 0–10)
Basophils Absolute: 0.1 10*3/uL (ref 0.0–0.1)
Basophils Relative: 2 % — ABNORMAL HIGH (ref 0–1)
Eosinophils Absolute: 0 10*3/uL (ref 0.0–0.7)
Eosinophils Relative: 0 % (ref 0–5)
Lymphocytes Relative: 20 % (ref 12–46)
Lymphs Abs: 1.3 10*3/uL (ref 0.7–4.0)
Monocytes Absolute: 0.1 10*3/uL (ref 0.1–1.0)
Monocytes Relative: 2 % — ABNORMAL LOW (ref 3–12)
Neutro Abs: 4.9 10*3/uL (ref 1.7–7.7)
Neutrophils Relative %: 26 % — ABNORMAL LOW (ref 43–77)
Promyelocytes Absolute: 0 %

## 2011-02-26 LAB — BASIC METABOLIC PANEL
BUN: 14 mg/dL (ref 6–23)
CO2: 27 mEq/L (ref 19–32)
Calcium: 9.1 mg/dL (ref 8.4–10.5)
Glucose, Bld: 75 mg/dL (ref 70–99)
Sodium: 134 mEq/L — ABNORMAL LOW (ref 135–145)

## 2011-02-26 MED ORDER — POLYETHYLENE GLYCOL 3350 17 G PO PACK
17.0000 g | PACK | Freq: Every day | ORAL | Status: DC
  Administered 2011-02-26: 17 g via ORAL
  Administered 2011-02-27: 09:00:00 via ORAL
  Filled 2011-02-26 (×2): qty 1

## 2011-02-26 MED ORDER — DOCUSATE SODIUM 100 MG PO CAPS
100.0000 mg | ORAL_CAPSULE | Freq: Two times a day (BID) | ORAL | Status: DC | PRN
  Administered 2011-02-26: 100 mg via ORAL
  Filled 2011-02-26: qty 1

## 2011-02-26 MED ORDER — SENNA 8.6 MG PO TABS
1.0000 | ORAL_TABLET | Freq: Two times a day (BID) | ORAL | Status: DC | PRN
  Administered 2011-02-26: 8.6 mg via ORAL
  Filled 2011-02-26: qty 1

## 2011-02-26 NOTE — Consult Note (Signed)
CSW spoke with pt's daughter Sierra Good to present bed offers. She chooses [facility]. Awaiting stability for d/c to SNF.  Facility notified.  [name]

## 2011-02-26 NOTE — Progress Notes (Signed)
Subjective:  Denies any cp, sob, nausea or abd pain.  Is comfortable sitting in the chair.  Objective: Vital signs in last 24 hours: Filed Vitals:   02/25/11 1426 02/25/11 2140 02/26/11 0637 02/26/11 0907  BP: 117/69 112/73 131/75 123/76  Pulse: 97 98 100   Temp:  98.2 F (36.8 C) 97.9 F (36.6 C)   TempSrc:      Resp:  16 18   Height:      Weight:      SpO2:  93% 95%    Weight change:   Intake/Output Summary (Last 24 hours) at 02/26/11 1103 Last data filed at 02/26/11 0900  Gross per 24 hour  Intake    540 ml  Output    800 ml  Net   -260 ml     General Appearance:    Alert, cooperative, no distress, appears stated age  Lungs:     Clear to auscultation bilaterally, respirations unlabored   Heart:    Regular rate and rhythm, S1 and S2 normal, no murmur, rub   or gallop  Abdomen:     Soft, non-tender, bowel sounds active all four quadrants,    no masses, no organomegaly  Extremities:   Extremities normal, atraumatic, no cyanosis or edema  Pulses:   2+ and symmetric all extremities          Lab Results: Pending.  Micro Results: No results found for this or any previous visit (from the past 240 hour(s)). Studies/Results: Dg Chest 2 View  02/25/2011  *RADIOLOGY REPORT*  Clinical Data: Fevers  CHEST - 2 VIEW  Comparison: 02/23/2011  Findings:  There is a left chest wall porta-catheter with tip in the SVC.  Heart size is normal.  No pleural effusion or pulmonary edema.  No airspace consolidation is identified.  Increased lobe volumes and coarsened interstitial markings suggest mild COPD.  Review of the visualized osseous structures is unremarkable.  IMPRESSION:  1.  No acute cardiopulmonary abnormalities. 2.  Suspect COPD.  Original Report Authenticated By: Rosealee Albee, M.D.   US Abdomen Complete  02/24/2011  *RADIOLOGY REPORT*  Clinical Data:  Nausea, past history of carcinoma of the colon  ULTRASOUND ABDOMEN:  Technique:  Sonography of upper abdominal structures  was performed.  Comparison:  None Correlation:  CT chest 02/23/2011, PET CT 01/19/2011  Gallbladder:  Normally distended without stones or wall thickening. No pericholecystic fluid or sonographic Murphy sign.  Common bile duct:  Normal caliber 4 mm diameter  Liver:  Complicated cystic lesion within the caudate, 1.4 x 1.3 x 1.7 cm, also noted on prior CT.  No additional hepatic mass. Parenchymal echogenicity normal.  No intrahepatic biliary dilatation.  IVC:  Normal appearance  Pancreas:  Normal appearance  Spleen:  Normal appearance, 5.8 cm length.  Right kidney:  9.9 cm length.  Cortical thinning.  No gross mass or hydronephrosis.  Left kidney:  9.6 cm length.  Cortical thinning.  No gross mass or hydronephrosis.  Aorta:  Normal caliber  Other:  No free fluid  IMPRESSION: Small complicated cyst within caudate lobe of liver, noted on prior CT exams. No acute sonographic abnormalities identified.  Original Report Authenticated By: Lollie Marrow, M.D.   Medications:  Scheduled Meds:   . amLODipine  2.5 mg Oral Daily  . enoxaparin  40 mg Subcutaneous Q24H  . megestrol  800 mg Oral Daily  . pantoprazole  40 mg Oral Daily  . polyethylene glycol  17 g Oral Daily  Continuous Infusions:  PRN Meds:.HYDROcodone-acetaminophen, LORazepam, ondansetron (ZOFRAN) IV, ondansetron Assessment/Plan: Patient Active Hospital Problem List: Atrial fibrillation (11/13/2008)  has a h/o of  Afib, rate controlled. Not on any meds. Cardiology consult called.  Last echo within normal limits.  Lymphoma of lymph nodes of head, face, and/or neck (01/12/2011)  oncology on board.  Nausea alone (02/23/2011)  resolved. Pt OT evaulation recommended snf placement, awaiting bed at snf.    LOS: 3 days   Sierra Good 02/26/2011, 11:03 AM

## 2011-02-26 NOTE — Progress Notes (Addendum)
Subjective: The patient reports that her last BM was on the first day of admission.  She explains that she feels slightly uncomfortable.  She reports that MiraLax in the past has helped regulate her bowels when needed.  The patient explains that she has low back pain due to osteoarthritis.  She is receiving inpatient pain medication for that and it is controlling her pain well.  The patient reports that she feels better today compared to yesterday.  No nausea, vomiting, fevers, chills, night sweats.  Objective: Vital signs in last 24 hours: Temp:  [97.9 F (36.6 C)-98.8 F (37.1 C)] 97.9 F (36.6 C) (09/20 5621) Pulse Rate:  [96-107] 100  (09/20 0637) Resp:  [16-18] 18  (09/20 0637) BP: (110-131)/(69-76) 123/76 mmHg (09/20 0907) SpO2:  [93 %-95 %] 95 % (09/20 0637)  Intake/Output from previous day: 09/19 0701 - 09/20 0700 In: 360 [P.O.:360] Out: 600 [Urine:600] Intake/Output this shift: Total I/O In: 300 [P.O.:300] Out: 200 [Urine:200]  General appearance: alert, cooperative, appears stated age and no distress Resp: clear to auscultation bilaterally Cardio: regular rate and rhythm, S1, S2 normal, no murmur, click, rub or gallop GI: soft, non-tender; bowel sounds normal; no masses,  no organomegaly Extremities: extremities normal, atraumatic, no cyanosis or edema  Lab Results:  CBC    Component Value Date/Time   WBC 3.1* 02/25/2011 0534   RBC 3.98 02/25/2011 0534   HGB 12.8 02/25/2011 0534   HCT 37.0 02/25/2011 0534   PLT 183 02/25/2011 0534   MCV 93.0 02/25/2011 0534   MCH 32.2 02/25/2011 0534   MCHC 34.6 02/25/2011 0534   RDW 14.3 02/25/2011 0534   LYMPHSABS 0.8 02/25/2011 0534   MONOABS 0.8 02/25/2011 0534   EOSABS 0.1 02/25/2011 0534   BASOSABS 0.0 02/25/2011 0534    BMET  Basename 02/24/11 0308 02/23/11 1645  NA 137 136  K 4.4 3.7  CL 98 95*  CO2 28 26  GLUCOSE 132* 132*  BUN 25* 24*  CREATININE 0.850.87 1.03  CALCIUM 9.3 9.9    Studies/Results: Dg Chest 2  View  02/25/2011  *RADIOLOGY REPORT*  Clinical Data: Fevers  CHEST - 2 VIEW  Comparison: 02/23/2011  Findings:  There is a left chest wall porta-catheter with tip in the SVC.  Heart size is normal.  No pleural effusion or pulmonary edema.  No airspace consolidation is identified.  Increased lobe volumes and coarsened interstitial markings suggest mild COPD.  Review of the visualized osseous structures is unremarkable.  IMPRESSION:  1.  No acute cardiopulmonary abnormalities. 2.  Suspect COPD.  Original Report Authenticated By: Rosealee Albee, M.D.   US Abdomen Complete  02/24/2011  *RADIOLOGY REPORT*  Clinical Data:  Nausea, past history of carcinoma of the colon  ULTRASOUND ABDOMEN:  Technique:  Sonography of upper abdominal structures was performed.  Comparison:  None Correlation:  CT chest 02/23/2011, PET CT 01/19/2011  Gallbladder:  Normally distended without stones or wall thickening. No pericholecystic fluid or sonographic Murphy sign.  Common bile duct:  Normal caliber 4 mm diameter  Liver:  Complicated cystic lesion within the caudate, 1.4 x 1.3 x 1.7 cm, also noted on prior CT.  No additional hepatic mass. Parenchymal echogenicity normal.  No intrahepatic biliary dilatation.  IVC:  Normal appearance  Pancreas:  Normal appearance  Spleen:  Normal appearance, 5.8 cm length.  Right kidney:  9.9 cm length.  Cortical thinning.  No gross mass or hydronephrosis.  Left kidney:  9.6 cm length.  Cortical thinning.  No gross mass or hydronephrosis.  Aorta:  Normal caliber  Other:  No free fluid  IMPRESSION: Small complicated cyst within caudate lobe of liver, noted on prior CT exams. No acute sonographic abnormalities identified.  Original Report Authenticated By: Lollie Marrow, M.D.    Medications: I have reviewed the patient's current medications.  Assessment/Plan: 1. Constipation, MiraLax today 2. Leukopenia, resolved 3. Atrial fibrillation, cardiology consult ordered 4. Fatigue, weakness, secondary  to chemotherapy and potentially A-fib 5. Nasopharyngeal lymphoma, S/P 1 cycles of chemotherapy.  Will consider deferring next cycle of chemotherapy for 1 week.  Will consider a dose reduction of chemotherapy and/or neulasta support.     LOS: 3 days    KEFALAS,THOMAS 02/26/2011

## 2011-02-27 MED ORDER — HYDROCODONE-ACETAMINOPHEN 5-325 MG PO TABS: 1.0000 | ORAL_TABLET | ORAL | Status: AC | PRN

## 2011-02-27 MED ORDER — DSS 100 MG PO CAPS: 100.0000 mg | ORAL_CAPSULE | Freq: Two times a day (BID) | ORAL | Status: AC | PRN

## 2011-02-27 MED ORDER — MAGNESIUM HYDROXIDE 400 MG/5ML PO SUSP
30.0000 mL | Freq: Once | ORAL | Status: AC
  Administered 2011-02-27: 30 mL via ORAL
  Filled 2011-02-27: qty 30

## 2011-02-27 MED ORDER — SODIUM CHLORIDE 0.9 % IJ SOLN
INTRAMUSCULAR | Status: AC
  Administered 2011-02-27: 10 mL
  Filled 2011-02-27: qty 10

## 2011-02-27 MED ORDER — POLYETHYLENE GLYCOL 3350 17 G PO PACK: 17.0000 g | PACK | Freq: Every day | ORAL | Status: AC

## 2011-02-27 MED ORDER — HEPARIN SOD (PORK) LOCK FLUSH 100 UNIT/ML IV SOLN
500.0000 [IU] | Freq: Once | INTRAVENOUS | Status: AC
  Administered 2011-02-27: 500 [IU] via INTRAVENOUS
  Filled 2011-02-27: qty 5

## 2011-02-27 NOTE — Discharge Summary (Signed)
Physician Discharge Summary  Patient ID: Sierra Good MRN: [id number] DOB/AGE: 1922/07/27 77 y.o.  Admit date: 02/23/2011 Discharge date: 02/27/2011  Primary Care Physician:  [name], MD   Discharge Diagnoses:   Pancytopenia Atrial Fibrillation Hypertension Nasal lymphoma Constipation   Present on Admission:  .Nausea alone .Lymphoma of lymph nodes of head, face, and/or neck .Atrial fibrillation  Current Discharge Medication List    START taking these medications   Details  polyethylene glycol (MIRALAX / GLYCOLAX) packet Take 17 g by mouth daily. Qty: 14 each, Refills: 0      CONTINUE these medications which have CHANGED   Details  docusate sodium 100 MG CAPS Take 100 mg by mouth 2 (two) times daily as needed for constipation. Qty: 10 capsule, Refills: 1    HYDROcodone-acetaminophen (NORCO) 5-325 MG per tablet Take 1 tablet by mouth every 4 (four) hours as needed. Qty: 30 tablet, Refills: 0      CONTINUE these medications which have NOT CHANGED   Details  amLODipine (NORVASC) 5 MG tablet Take 2.5 mg by mouth daily.     aspirin 325 MG EC tablet Take 325 mg by mouth daily.      HYDROcodone-acetaminophen (VICODIN) 5-500 MG per tablet Take 1 tablet by mouth every 4 (four) hours as needed. For pain     lansoprazole (PREVACID) 15 MG capsule Take 15 mg by mouth daily as needed.      Magnesium Hydroxide (MILK OF MAGNESIA PO) Take by mouth as needed.      Multiple Vitamins-Minerals (CENTRUM SILVER ULTRA WOMENS) TABS Take 1 tablet by mouth daily.      ondansetron (ZOFRAN) 8 MG tablet Take 1 tablet two times a day starting the day after chemo for 3 days. Then take 1 tablet three times a day as needed for nausea or vomiting.      Pitavastatin Calcium (LIVALO) 2 MG TABS Take 2 mg by mouth daily.     senna (SENOKOT) 8.6 MG tablet Take 1 tablet by mouth every evening. As needed for relief     LORazepam (ATIVAN) 0.5 MG tablet Take 0.5 mg by mouth every 3  (three) hours as needed. Take one tablet by mouth every 3 to 4 hours as needed for nausea and vomiting.     megestrol (MEGACE) 40 MG/ML suspension Take 800 mg by mouth daily. 4 tsp daily       STOP taking these medications     omeprazole (PRILOSEC) 20 MG capsule      predniSONE (DELTASONE) 50 MG tablet      prochlorperazine (COMPRO) 25 MG suppository      aspirin 81 MG tablet      Coenzyme Q10 (COQ-10 PO)      Multiple Vitamin (MULTIVITAMIN) capsule      predniSONE (DELTASONE) 20 MG tablet      prochlorperazine (COMPAZINE) 25 MG suppository          Disposition and Follow-up: Follow up with primary care physician in one to 2 weeks as needed  followup with Dr. Mariel Sleet in one week.  Followup with the [facility] cardiology on October 4 for evaluation of atrial fibrillation and for clearance for further chemotherapy.  Consults: outpatient f/u with Cardiology.    Significant Diagnostic Studies:  Ct Angio Chest W/cm &/or Wo Cm  02/23/2011  *RADIOLOGY REPORT*  Clinical Data:  77 year old female with shortness of breath. History of colon and nasal cancer.  CT ANGIOGRAPHY CHEST WITH CONTRAST  Technique:  Multidetector CT imaging of the  chest was performed using the standard protocol during bolus administration of intravenous contrast.  Multiplanar CT image reconstructions including MIPs were obtained to evaluate the vascular anatomy.  Contrast:  100 ml intravenous Omnipaque-350  Comparison:  01/19/2011 PET-CT  Findings:  This is a technically satisfactory study.  No pulmonary emboli are identified. There is no evidence of thoracic aortic aneurysm.  Cardiomegaly and moderate coronary artery calcifications are again noted. There are no pleural or pericardial effusions. No enlarged lymph nodes are identified.  Fluid within the thoracic esophagus is identified - question reflux versus esophageal motility disorder.  There is no evidence of airspace disease, consolidation, suspicious nodule/mass  or endobronchial/endotracheal lesions. Mild biapical pleuroparenchymal scarring is identified.  No acute or suspicious bony abnormalities are identified. A small to moderate hiatal hernia is identified. A left-sided Port-A-Cath is identified with tip in the lower SVC.  Review of the MIP images confirms the above findings.  IMPRESSION: No evidence of acute abnormality - no evidence of pulmonary emboli or thoracic aortic aneurysm.  Hiatal hernia with fluid within the thoracic esophagus - question reflux versus esophageal motility disorder.  Cardiomegaly and coronary artery disease.  Original Report Authenticated By: Rosendo Gros, M.D.   US Abdomen Complete  02/24/2011  *RADIOLOGY REPORT*  Clinical Data:  Nausea, past history of carcinoma of the colon  ULTRASOUND ABDOMEN:  Technique:  Sonography of upper abdominal structures was performed.  Comparison:  None Correlation:  CT chest 02/23/2011, PET CT 01/19/2011  Gallbladder:  Normally distended without stones or wall thickening. No pericholecystic fluid or sonographic Murphy sign.  Common bile duct:  Normal caliber 4 mm diameter  Liver:  Complicated cystic lesion within the caudate, 1.4 x 1.3 x 1.7 cm, also noted on prior CT.  No additional hepatic mass. Parenchymal echogenicity normal.  No intrahepatic biliary dilatation.  IVC:  Normal appearance  Pancreas:  Normal appearance  Spleen:  Normal appearance, 5.8 cm length.  Right kidney:  9.9 cm length.  Cortical thinning.  No gross mass or hydronephrosis.  Left kidney:  9.6 cm length.  Cortical thinning.  No gross mass or hydronephrosis.  Aorta:  Normal caliber  Other:  No free fluid  IMPRESSION: Small complicated cyst within caudate lobe of liver, noted on prior CT exams. No acute sonographic abnormalities identified.  Original Report Authenticated By: Lollie Marrow, M.D.   Dg Abd Acute W/chest  02/23/2011  *RADIOLOGY REPORT*  Clinical Data: Nausea and vomiting.  ACUTE ABDOMEN SERIES (ABDOMEN 2 VIEW & CHEST 1  VIEW)  Comparison: None  Findings: The upright chest x-ray demonstrates a power port in good position.  The heart is normal in size.  No acute pulmonary findings.  Two views of the abdomen demonstrate scattered air and stool in the colon.  No distended small bowel loops to suggest obstruction.  The soft tissue shadows are maintained.  No free air.  The bony structures are unremarkable.  IMPRESSION:  1.  No acute cardiopulmonary findings. 2.  No plain film findings for an acute abdominal process.  Original Report Authenticated By: P. Loralie Champagne, M.D.    Brief H and P: For complete details please refer to admission H and P, but in brief Ms. Bendix is an 77 year old female who was  recently diagnosed with nasal lymphoma who received her first  chemotherapy about 2 weeks ago, admitted for nausea and vomiting since chemotherapy and has been admitted for symptomatic management. For further details see detailed H&P dictated by Dr. Onalee Hua  Hospital Course:  Nausea and Vomiting: resolved Atrial Fibrillation : not on any meds. 2d echo showed good systolic function. Has a outpatient f/u with [facility] cardiology on October 4th for further evaluation of atrial fibrillation and for clearence for further chemo  Nasal Lymphoma: Patient had chemotherapy 2 weeks ago. Patient at this time is reluctant to continue her chemotherapy but would like to speak to Dr. Mariel Sleet regarding that.  Hypertension: controlled  Hyperlipidimia : stable   Constipation: on Bowel regimen.    Time spent on Discharge:35 minutes.   SignedKathlen Mody 02/27/2011, 10:02 AM

## 2011-02-27 NOTE — Progress Notes (Signed)
Port flushed and deaccessed without complaint, patient hemodynamically stable upon discharge. Patient discharged to SNF at [facility], report called to Philis Kendall RN. Patient transported to SNF by daughter, escorted out by staff.

## 2011-02-27 NOTE — Consult Note (Signed)
Pt d/c today by MD to [facility]. Pt, family and facility aware and agreeable. Pt to transfer with daughter.   [name]

## 2011-03-02 ENCOUNTER — Other Ambulatory Visit (HOSPITAL_COMMUNITY): Payer: Medicare Other

## 2011-03-02 ENCOUNTER — Ambulatory Visit (HOSPITAL_COMMUNITY): Payer: Medicare Other | Admitting: Oncology

## 2011-03-03 ENCOUNTER — Inpatient Hospital Stay (HOSPITAL_COMMUNITY): Payer: Medicare Other

## 2011-03-04 ENCOUNTER — Inpatient Hospital Stay (HOSPITAL_COMMUNITY): Payer: Medicare Other

## 2011-03-05 LAB — BASIC METABOLIC PANEL
BUN: 12
CO2: 28
CO2: 26
CO2: 28
Calcium: 9
Chloride: 105
Chloride: 103
Chloride: 98
Creatinine, Ser: 0.59
Glucose, Bld: 97
Glucose, Bld: 115 — ABNORMAL HIGH
Glucose, Bld: 129 — ABNORMAL HIGH
Potassium: 5
Potassium: 3.8
Potassium: 4.8
Sodium: 142
Sodium: 138

## 2011-03-05 LAB — URINE MICROSCOPIC-ADD ON

## 2011-03-05 LAB — CBC
HCT: 44.8
HCT: 28.3 — ABNORMAL LOW
HCT: 36.3
Hemoglobin: 15.4 — ABNORMAL HIGH
Hemoglobin: 12.4
MCHC: 34.3
MCHC: 34
MCHC: 34.5
MCV: 92.7
MCV: 92.9
Platelets: 191
RBC: 3.91
RDW: 14.1
RDW: 13.7

## 2011-03-05 LAB — DIFFERENTIAL
Basophils Absolute: 0
Basophils Relative: 0
Eosinophils Relative: 1
Monocytes Absolute: 0.5

## 2011-03-05 LAB — URINALYSIS, ROUTINE W REFLEX MICROSCOPIC
Bilirubin Urine: NEGATIVE
Glucose, UA: NEGATIVE
Ketones, ur: NEGATIVE
Specific Gravity, Urine: 1.019
pH: 7.5

## 2011-03-05 LAB — PROTIME-INR
INR: 2.4 — ABNORMAL HIGH
Prothrombin Time: 20.3 — ABNORMAL HIGH

## 2011-03-05 LAB — TYPE AND SCREEN: Antibody Screen: NEGATIVE

## 2011-03-09 ENCOUNTER — Ambulatory Visit (HOSPITAL_COMMUNITY): Payer: Medicare Other | Admitting: Oncology

## 2011-03-10 ENCOUNTER — Inpatient Hospital Stay (HOSPITAL_COMMUNITY): Payer: Medicare Other

## 2011-03-11 ENCOUNTER — Telehealth (HOSPITAL_COMMUNITY): Payer: Self-pay | Admitting: *Deleted

## 2011-03-11 NOTE — Telephone Encounter (Signed)
Patient's daughter called to let us know that patient will be having cardioversion at [facility] on Monday 10/8. You are to see patient on Friday 10/5 to discuss future tx options. Daughter states that patient does not want further chemo because she thinks that is what got her heart out of rhythm. Daughters aware that the afib was present before chemo. Daughter wants you to explain to patient what the prognosis will be if patient does no further tx. Just keep this in mind for her visit on Friday please.

## 2011-03-12 ENCOUNTER — Ambulatory Visit: Payer: Medicare Other | Admitting: Cardiology

## 2011-03-13 ENCOUNTER — Telehealth (HOSPITAL_COMMUNITY): Payer: Self-pay | Admitting: *Deleted

## 2011-03-13 ENCOUNTER — Encounter (HOSPITAL_COMMUNITY): Payer: Self-pay | Admitting: Oncology

## 2011-03-13 ENCOUNTER — Encounter (HOSPITAL_COMMUNITY): Payer: Medicare Other | Attending: Oncology | Admitting: Oncology

## 2011-03-13 ENCOUNTER — Other Ambulatory Visit (HOSPITAL_COMMUNITY): Payer: Self-pay | Admitting: *Deleted

## 2011-03-13 ENCOUNTER — Other Ambulatory Visit (HOSPITAL_COMMUNITY): Payer: Self-pay | Admitting: Oncology

## 2011-03-13 VITALS — BP 141/84 | HR 94 | Temp 97.5°F | Ht 65.0 in | Wt 142.2 lb

## 2011-03-13 DIAGNOSIS — R634 Abnormal weight loss: Secondary | ICD-10-CM | POA: Insufficient documentation

## 2011-03-13 DIAGNOSIS — C8591 Non-Hodgkin lymphoma, unspecified, lymph nodes of head, face, and neck: Secondary | ICD-10-CM

## 2011-03-13 DIAGNOSIS — C8581 Other specified types of non-Hodgkin lymphoma, lymph nodes of head, face, and neck: Secondary | ICD-10-CM

## 2011-03-13 MED ORDER — PREDNISONE 20 MG PO TABS: 20.0000 mg | ORAL_TABLET | Freq: Every day | ORAL | Status: AC

## 2011-03-13 NOTE — Telephone Encounter (Signed)
error 

## 2011-03-13 NOTE — Patient Instructions (Signed)
[facility]  Discharge Instructions  RECOMMENDATIONS MADE BY THE CONSULTANT AND ANY TEST RESULTS WILL BE SENT TO YOUR REFERRING DOCTOR.   EXAM FINDINGS BY MD TODAY AND SIGNS AND SYMPTOMS TO REPORT TO CLINIC OR PRIMARY MD:  Exam per [name]. He would like to try chemo again at reduced doses if cardioversion goes well.  MEDICATIONS PRESCRIBED: Prednisone will be called in once chemo orders redone. Take with food or milk  INSTRUCTIONS GIVEN AND DISCUSSED: Other: You may have flu shot the Tuesday after cardioversion.  SPECIAL INSTRUCTIONS/FOLLOW-UP: Other (Referral/Appointments)  [place] 2 weeks from next Thursday Referral to dr. Kristin Bruins at [facility] 03/23/2011   I acknowledge that I have been informed and understand all the instructions given to me and received a copy. I do not have any more questions at this time, but understand that I may call the Specialty Clinic at [facility] at [phone] during business hours should I have any further questions or need assistance in obtaining follow-up care.    __________________________________________  _____________  __________ Signature of Patient or Authorized Representative            Date                   Time    __________________________________________ Nurse's Signature

## 2011-03-13 NOTE — Progress Notes (Signed)
This office note has been dictated.

## 2011-03-13 NOTE — Progress Notes (Signed)
CC:   Sierra Good, Ph.D., M.D. Sierra Good, M.D. Sierra Pies, MD Sierra Good, D.D.S.  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  Bone marrow biopsy was negative.  PET scan showed disease only in 1 lymph node group, essentially in the right neck and in the right nasal cavity. This was CD20 positive, and she had 1 cycle of R-CVP, ended up in the hospital with atrial fibrillation, neutropenia, weakness, fatigue, almost a bedridden state essentially.  She is here today with her one daughter, feeling much better but still tired and weak.  She is going to have cardioversion on Monday.  She does not have any B symptoms.  I cannot feel any nodes in her neck or supraclavicular fossa.  I cannot find any retroauricular nodes or angle-of-the-jaw nodes, etc.  She also notices that her breathing had been complicated by a stuffy nose and that is now gone, and the mucus and discharges she had before therapy are gone as well.  So what we decided to do is try to reduce her dose of chemotherapy and try to go with 2 more cycles.  We are going to reduce her Cytoxan from 800 mg/m2 to 600 mg/m2, vincristine 2 mg to 1 mg, keep the Rituxan as it is, and the prednisone as it is.  We will get her to see Dr. Roselind Good again after cycle number 2.  He had already mentioned to the daughter and her that he wanted Dr. Kristin Good to see her in consultation.  We will send her to see Dr. Kristin Good for a pre-radiation consultation.  I will see her back in about 5 weeks.  I will ask Sierra Good, my PA, to see her 4 or 5 days after cycle number 2, which will be on October 15th.    ______________________________ Sierra Good. Sierra Sleet, MD ESN/MEDQ  D:  03/13/2011  T:  03/13/2011  Job:  [id number]

## 2011-03-16 ENCOUNTER — Ambulatory Visit (HOSPITAL_COMMUNITY)
Admission: RE | Admit: 2011-03-16 | Discharge: 2011-03-16 | Disposition: A | Discharge status: Home / Self Care | Payer: Medicare Other | Source: Ambulatory Visit | Attending: Cardiovascular Disease | Admitting: Cardiovascular Disease

## 2011-03-16 DIAGNOSIS — I4891 Unspecified atrial fibrillation: Secondary | ICD-10-CM | POA: Insufficient documentation

## 2011-03-16 DIAGNOSIS — Z01810 Encounter for preprocedural cardiovascular examination: Secondary | ICD-10-CM | POA: Insufficient documentation

## 2011-03-19 ENCOUNTER — Ambulatory Visit: Payer: Medicare Other | Admitting: Cardiology

## 2011-03-20 ENCOUNTER — Other Ambulatory Visit (HOSPITAL_COMMUNITY): Payer: Self-pay | Admitting: Oncology

## 2011-03-20 NOTE — Op Note (Signed)
  NAMEPERI, KREFT             ACCOUNT NO.:  [id number]  MEDICAL RECORD NO.:  [id number]  LOCATION:  [facility]                         FACILITY:  [facility]  PHYSICIAN:  Thurmon Fair, MD     DATE OF BIRTH:  Dec 31, 1922  DATE OF PROCEDURE: DATE OF DISCHARGE:                              OPERATIVE REPORT   PROCEDURE PERFORMED: Synchronized cardioversion.  REASON FOR PROCEDURE: Symptomatic atrial fibrillation.  MEDICATIONS:  Propofol 50 mg intravenously by Dr. Noreene Larsson of the Anesthesiology service, Versed 3 mg, and fentanyl 25 mcg had already been administered for the TEE that immediately proceeded with cardioversion.  There is no evidence of left atrial thrombus by TEE.  After the risks and benefits of the TEE and the cardioversion were described, the patient provided informed consent and was brought to the endoscopy suite in a fasting state.  A TEE was performed that did not show evidence of left atrial thrombus.  Anterior and posterior chest pads were placed.  After adequate sedation had been achieved, the patient received a single 120 joule biphasic synchronized direct current cardioversion shock via the anterior and posterior chest pads.  The emergent rhythm was sinus rhythm with occasional PVCs with a heart rate of about 85 beats per minute.  No immediate complications occurred.     Thurmon Fair, MD     MC/MEDQ  D:  03/16/2011  T:  03/16/2011  Job:  [id number]  cc:   [facility] Ladona Horns. Mariel Sleet, MD Delaney Meigs, M.D.  Electronically Signed by Thurmon Fair M.D. on 03/20/2011 11:26:35 AM

## 2011-03-23 ENCOUNTER — Other Ambulatory Visit (HOSPITAL_COMMUNITY): Payer: Medicare Other

## 2011-03-23 ENCOUNTER — Other Ambulatory Visit (HOSPITAL_COMMUNITY): Payer: Self-pay | Admitting: Oncology

## 2011-03-23 ENCOUNTER — Encounter (HOSPITAL_BASED_OUTPATIENT_CLINIC_OR_DEPARTMENT_OTHER): Payer: Medicare Other

## 2011-03-23 VITALS — BP 129/80 | HR 88 | Temp 98.6°F | Wt 140.8 lb

## 2011-03-23 DIAGNOSIS — C8591 Non-Hodgkin lymphoma, unspecified, lymph nodes of head, face, and neck: Secondary | ICD-10-CM

## 2011-03-23 DIAGNOSIS — C8581 Other specified types of non-Hodgkin lymphoma, lymph nodes of head, face, and neck: Secondary | ICD-10-CM

## 2011-03-23 DIAGNOSIS — Z5111 Encounter for antineoplastic chemotherapy: Secondary | ICD-10-CM

## 2011-03-23 LAB — CBC
Hemoglobin: 13.9 g/dL (ref 12.0–15.0)
MCH: 31.6 pg (ref 26.0–34.0)
MCHC: 33.7 g/dL (ref 30.0–36.0)
MCV: 93.9 fL (ref 78.0–100.0)
Platelets: 290 10*3/uL (ref 150–400)
RBC: 4.4 MIL/uL (ref 3.87–5.11)

## 2011-03-23 LAB — DIFFERENTIAL
Basophils Relative: 0 % (ref 0–1)
Eosinophils Absolute: 0.1 10*3/uL (ref 0.0–0.7)
Eosinophils Relative: 1 % (ref 0–5)
Lymphs Abs: 0.5 10*3/uL — ABNORMAL LOW (ref 0.7–4.0)
Monocytes Relative: 4 % (ref 3–12)

## 2011-03-23 LAB — COMPREHENSIVE METABOLIC PANEL
Albumin: 3.5 g/dL (ref 3.5–5.2)
BUN: 22 mg/dL (ref 6–23)
Calcium: 10 mg/dL (ref 8.4–10.5)
Creatinine, Ser: 0.85 mg/dL (ref 0.50–1.10)
GFR calc Af Amer: 70 mL/min — ABNORMAL LOW (ref >90)
Glucose, Bld: 94 mg/dL (ref 70–99)
Total Protein: 6.5 g/dL (ref 6.0–8.3)

## 2011-03-23 MED ORDER — ACETAMINOPHEN 500 MG PO TABS
ORAL_TABLET | ORAL | Status: AC
  Filled 2011-03-23: qty 2

## 2011-03-23 MED ORDER — SODIUM CHLORIDE 0.9 % IJ SOLN
10.0000 mL | INTRAMUSCULAR | Status: DC | PRN
  Administered 2011-03-23: 10 mL
  Filled 2011-03-23: qty 10

## 2011-03-23 MED ORDER — VINCRISTINE SULFATE CHEMO INJECTION 1 MG/ML
1.0000 mg | Freq: Once | INTRAVENOUS | Status: AC
  Administered 2011-03-23: 1 mg via INTRAVENOUS
  Filled 2011-03-23: qty 1

## 2011-03-23 MED ORDER — DIPHENHYDRAMINE HCL 25 MG PO CAPS: 50.0000 mg | ORAL_CAPSULE | Freq: Once | ORAL | Status: DC

## 2011-03-23 MED ORDER — DIPHENHYDRAMINE HCL 50 MG/ML IJ SOLN
INTRAMUSCULAR | Status: AC
  Filled 2011-03-23: qty 1

## 2011-03-23 MED ORDER — SODIUM CHLORIDE 0.9 % IV SOLN
600.0000 mg/m2 | Freq: Once | INTRAVENOUS | Status: AC
  Administered 2011-03-23: 1060 mg via INTRAVENOUS
  Filled 2011-03-23: qty 53

## 2011-03-23 MED ORDER — SODIUM CHLORIDE 0.9 % IV SOLN
Freq: Once | INTRAVENOUS | Status: AC
  Administered 2011-03-23: 16 mg via INTRAVENOUS
  Filled 2011-03-23: qty 8

## 2011-03-23 MED ORDER — DIPHENHYDRAMINE HCL 25 MG PO CAPS
ORAL_CAPSULE | ORAL | Status: AC
  Filled 2011-03-23: qty 2

## 2011-03-23 MED ORDER — SODIUM CHLORIDE 0.9 % IV SOLN
375.0000 mg/m2 | Freq: Once | INTRAVENOUS | Status: AC
  Administered 2011-03-23: 700 mg via INTRAVENOUS
  Filled 2011-03-23 (×2): qty 70

## 2011-03-23 MED ORDER — ONDANSETRON 8 MG/50ML IVPB (CHCC): 16.0000 mg | Freq: Once | INTRAVENOUS | Status: DC

## 2011-03-23 MED ORDER — HEPARIN SOD (PORK) LOCK FLUSH 100 UNIT/ML IV SOLN
500.0000 [IU] | Freq: Once | INTRAVENOUS | Status: AC | PRN
  Administered 2011-03-23: 500 [IU]
  Filled 2011-03-23: qty 5

## 2011-03-23 MED ORDER — SODIUM CHLORIDE 0.9 % IJ SOLN
INTRAMUSCULAR | Status: AC
  Administered 2011-03-23: 10 mL
  Filled 2011-03-23: qty 20

## 2011-03-23 MED ORDER — ACETAMINOPHEN 325 MG PO TABS
650.0000 mg | ORAL_TABLET | Freq: Once | ORAL | Status: DC
  Filled 2011-03-23: qty 2

## 2011-03-23 MED ORDER — DEXAMETHASONE SODIUM PHOSPHATE 4 MG/ML IJ SOLN
20.0000 mg | Freq: Once | INTRAMUSCULAR | Status: DC
  Filled 2011-03-23: qty 5

## 2011-03-23 MED ORDER — SODIUM CHLORIDE 0.9 % IV SOLN: Freq: Once | INTRAVENOUS | Status: DC

## 2011-03-24 ENCOUNTER — Inpatient Hospital Stay (HOSPITAL_COMMUNITY): Payer: Medicare Other

## 2011-03-24 ENCOUNTER — Other Ambulatory Visit (HOSPITAL_COMMUNITY): Payer: Self-pay | Admitting: Oncology

## 2011-03-25 ENCOUNTER — Inpatient Hospital Stay (HOSPITAL_COMMUNITY): Payer: Medicare Other

## 2011-03-26 ENCOUNTER — Encounter (HOSPITAL_BASED_OUTPATIENT_CLINIC_OR_DEPARTMENT_OTHER): Payer: Medicare Other | Admitting: Oncology

## 2011-03-26 VITALS — BP 133/84 | HR 99 | Temp 97.6°F | Ht 65.0 in | Wt 143.6 lb

## 2011-03-26 DIAGNOSIS — C8589 Other specified types of non-Hodgkin lymphoma, extranodal and solid organ sites: Secondary | ICD-10-CM

## 2011-03-26 DIAGNOSIS — R634 Abnormal weight loss: Secondary | ICD-10-CM

## 2011-03-26 DIAGNOSIS — I4891 Unspecified atrial fibrillation: Secondary | ICD-10-CM

## 2011-03-26 MED ORDER — DRONABINOL 2.5 MG PO CAPS: 2.5000 mg | ORAL_CAPSULE | Freq: Two times a day (BID) | ORAL | Status: AC

## 2011-03-26 NOTE — Patient Instructions (Addendum)
[facility]ty Clinic  Discharge Instructions  RECOMMENDATIONS MADE BY THE CONSULTANT AND ANY TEST RESULTS WILL BE SENT TO YOUR REFERRING DOCTOR.   EXAM FINDINGS BY MD TODAY AND SIGNS AND SYMPTOMS TO REPORT TO CLINIC OR PRIMARY MD:   We are going to be prescribing Marinol for you. This will help with your appetite and decrease nausea and vomiting. This medicine may make you relaxed.   We will call [facility] and see if we can get you worked back in.  Hold MEGACE because Marinol will replace it.  We want Dr. Mariel Sleet to peek in on you the day you take chemo to make sure you have recovered completely.   Return in 3 weeks for follow up to see Dr. Mariel Sleet   I acknowledge that I have been informed and understand all the instructions given to me and received a copy. I do not have any more questions at this time, but understand that I may call the Specialty Clinic at [facility] at [phone] during business hours should I have any further questions or need assistance in obtaining follow-up care.    __________________________________________  _____________  __________ Signature of Patient or Authorized Representative            Date                   Time    __________________________________________ Nurse's Signature

## 2011-03-26 NOTE — Progress Notes (Signed)
Sierra Hector, MD [address] [facility] [place] [address]  1. Weight loss  dronabinol (MARINOL) 2.5 MG capsule    CURRENT THERAPY:S/P 3 cycle of Rituxan, Vincristine, and Cytoxan on 02/11/11 which is anticipated to be delivered avery 21 days. Patient underwent a dose reduction following her second cycle chemotherapy. This included a 50% dose reduction in vincristine, and a 25% dose reduction and Cytoxan.   INTERVAL HISTORY: Sierra Good 77 y.o. 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 Bone marrow biopsy was negative. PET scan showed disease only in 1 lymph node group, essentially in the right neck and in the right nasal cavity. This was CD20 positive.  Patient reports that she has no appetite. She is presently on Megace with no increase in appetite. As result we will change this medication around. I've asked the patient to place her Megace on hold and substitute it with Marinol.  Patient reports that she continues to have fatigue. This is likely secondary to her atrial fibrillation. However chemotherapy likely has a role in this symptom as well.  The patient reports that in approximately 2 weeks' time she will undergo another cardioversion. Her cardiologist is placed her on a oral medication that will hopefully help with this next cardioversion. Her previous cardioversion did not last long. It only lasted a few days. Hopefully 2 weeks time will have a longer lasting successful cardioversion.  Other than fatigue and decreased appetite the patient denies any complaints. She understands that she is reaching her nadir following chemotherapy. She got treated approximately one week ago.  The patient questions whether she can now flu shot. I believe the patient can have a vaccination when her counts are the highest which would be a few days prior to her next chemotherapy initiation. I have sent  Dr. Mariel Good a note in his in basket so we can help facilitate this.  Patient reports that she has not had a bowel movement in 1 day. She continues to take her stool softener. She recently took MiraLAX today. I've asked her to call the clinic if she does not have a bowel movement by tomorrow afternoon. We may need to entertain the idea of milk of magnesia or magnesium citrate.  She is not feel uncomfortable with this consultation.    Past Medical History  Diagnosis Date  . GERD (gastroesophageal reflux disease)     takes omeprazole daily  . Hyperlipidemia   . Hypertension   . Osteoarthritis of lumbar spine   . PONV (postoperative nausea and vomiting) 2009    after total hip  . Deviated nasal septum     pt had septoplasty  . Nasal turbinate hypertrophy     bilateral  . Cancer     nasal cancer  . Cancer     cancer in the nose and in lower jaw  . Atrial fibrillation     has HYPERLIPIDEMIA; HYPERTENSION; Atrial fibrillation; BRADYCARDIA; SYNCOPE, HX OF; Lymphoma of lymph nodes of head, face, and/or neck; Nausea alone; and SOB (shortness of breath) on exertion on her problem list.     is allergic to statins.  Sierra Good does not currently have medications on file.  Past Surgical History  Procedure Date  . Total hip arthroplasty 2009    Right, [facility] (general)  . Bunionectomy 20+yrs ago    bilateral, [facility]  . Hammer toe surgery     20+ yrs ago  .  Cataract extraction w/ intraocular lens  implant, bilateral 2010    [facility]  . Nasal septoplasty w/ turbinoplasty 12/18/2010    Procedure: NASAL SEPTOPLASTY WITH TURBINATE REDUCTION;  Surgeon: Sierra Good;  Location: [facility]S;  Service: ENT;  Laterality: Bilateral;  . Colon surgery 2010    hemicoloctomy-flat polyp removed at [facility]  . Portacath placement 02/10/2011    Procedure: INSERTION PORT-A-CATH;  Surgeon: Sierra Good;  Location: [facility] ORS;  Service: General;  Laterality: Left;  left subclavian    Denies any headaches, dizziness, double  vision, fevers, chills, night sweats, nausea, vomiting, diarrhea, constipation, chest pain, heart palpitations, shortness of breath, blood in stool, black tarry stool, urinary pain, urinary burning, urinary frequency, hematuria.   PHYSICAL EXAMINATION  ECOG PERFORMANCE STATUS: 3 - Symptomatic, >50% confined to bed  Filed Vitals:   03/26/11 1321  BP: 133/84  Pulse: 99  Temp: 97.6 F (36.4 C)    GENERAL:alert, no distress and cooperative.  Patient seen sitting in wheelchair. SKIN: skin color, texture, turgor are normal HEAD: Normocephalic EYES: normal EARS: External ears normal OROPHARYNX:mucous membranes are moist  NECK: trachea midline LYMPH:  No epitrochlear nodes noted BREAST:not examined LUNGS: clear to auscultation  HEART: no murmurs, no gallops, irregularly irregular, S1 normal and S2 normal ABDOMEN:abdomen soft, non-tender and normal bowel sounds BACK: Back symmetric, no curvature. EXTREMITIES:less then 2 second capillary refill, no joint deformities, effusion, or inflammation, no edema, no skin discoloration, no clubbing, no cyanosis  NEURO: alert & oriented x 3 with fluent speech, no focal motor/sensory deficits, gait normal   LABORATORY DATA: CBC    Component Value Date/Time   WBC 8.9 03/23/2011 1011   RBC 4.40 03/23/2011 1011   HGB 13.9 03/23/2011 1011   HCT 41.3 03/23/2011 1011   PLT 290 03/23/2011 1011   MCV 93.9 03/23/2011 1011   MCH 31.6 03/23/2011 1011   MCHC 33.7 03/23/2011 1011   RDW 15.1 03/23/2011 1011   LYMPHSABS 0.5* 03/23/2011 1011   MONOABS 0.3 03/23/2011 1011   EOSABS 0.1 03/23/2011 1011   BASOSABS 0.0 03/23/2011 1011     ASSESSMENT:  1. Poor appetite 2. Diffuse Large B-Cell NHL, S/P 3 cycles of chemotherapy. 3. Atrial fibrillation, S/P 1 failed cardioversion   PLAN:  1. Will see if patient can get an appointment with Dr. Kristin Good 2. Hold Megace, ineffective 3. Marinol 2.5 mg PO, BID, prescription given.  Patient education given to  the patient regarding Marinol, how it works, its indications, and its relationship to cannabis. 4. Request Dr. Mariel Good see the patient the day of therapy to verify that she looks good for chemotherapy. 5. I personally reviewed and went over laboratory results with the patient. 6. Return in 3 weeks for follow-up. 7. Patient would like the Flu Shot.  Will discuss with Dr. Mariel Good first and coordinate the best time to administer the vaccination.   All questions were answered. The patient knows to call the clinic with any problems, questions or concerns. We can certainly see the patient much sooner if necessary.   Vernon Ariel

## 2011-03-31 ENCOUNTER — Ambulatory Visit (HOSPITAL_COMMUNITY): Payer: Medicare Other | Admitting: Oncology

## 2011-04-01 ENCOUNTER — Other Ambulatory Visit (HOSPITAL_COMMUNITY): Payer: Self-pay | Admitting: Dentistry

## 2011-04-01 DIAGNOSIS — Z0189 Encounter for other specified special examinations: Secondary | ICD-10-CM

## 2011-04-01 DIAGNOSIS — C8589 Other specified types of non-Hodgkin lymphoma, extranodal and solid organ sites: Secondary | ICD-10-CM

## 2011-04-02 ENCOUNTER — Other Ambulatory Visit (HOSPITAL_COMMUNITY): Payer: Self-pay | Admitting: Oncology

## 2011-04-03 ENCOUNTER — Other Ambulatory Visit (HOSPITAL_COMMUNITY): Payer: Self-pay | Admitting: Oncology

## 2011-04-10 ENCOUNTER — Encounter (HOSPITAL_COMMUNITY): Payer: Medicare Other | Attending: Oncology

## 2011-04-10 DIAGNOSIS — R350 Frequency of micturition: Secondary | ICD-10-CM | POA: Insufficient documentation

## 2011-04-10 DIAGNOSIS — C8581 Other specified types of non-Hodgkin lymphoma, lymph nodes of head, face, and neck: Secondary | ICD-10-CM

## 2011-04-10 LAB — DIFFERENTIAL
Basophils Absolute: 0 10*3/uL (ref 0.0–0.1)
Basophils Relative: 0 % (ref 0–1)
Eosinophils Absolute: 0.1 10*3/uL (ref 0.0–0.7)
Eosinophils Relative: 2 % (ref 0–5)
Lymphocytes Relative: 9 % — ABNORMAL LOW (ref 12–46)
Lymphs Abs: 0.5 10*3/uL — ABNORMAL LOW (ref 0.7–4.0)
Monocytes Absolute: 0.5 10*3/uL (ref 0.1–1.0)
Monocytes Relative: 11 % (ref 3–12)
Neutro Abs: 3.9 10*3/uL (ref 1.7–7.7)
Neutrophils Relative %: 78 % — ABNORMAL HIGH (ref 43–77)

## 2011-04-10 LAB — SEDIMENTATION RATE: Sed Rate: 28 mm/hr — ABNORMAL HIGH (ref 0–22)

## 2011-04-10 LAB — COMPREHENSIVE METABOLIC PANEL
ALT: 13 U/L (ref 0–35)
AST: 14 U/L (ref 0–37)
Albumin: 3.4 g/dL — ABNORMAL LOW (ref 3.5–5.2)
Alkaline Phosphatase: 74 U/L (ref 39–117)
BUN: 19 mg/dL (ref 6–23)
CO2: 23 mEq/L (ref 19–32)
Calcium: 9.9 mg/dL (ref 8.4–10.5)
Chloride: 101 mEq/L (ref 96–112)
Creatinine, Ser: 1.03 mg/dL (ref 0.50–1.10)
GFR calc Af Amer: 55 mL/min — ABNORMAL LOW (ref >90)
GFR calc non Af Amer: 48 mL/min — ABNORMAL LOW (ref >90)
Glucose, Bld: 95 mg/dL (ref 70–99)
Potassium: 4 mEq/L (ref 3.5–5.1)
Sodium: 135 mEq/L (ref 135–145)
Total Bilirubin: 0.3 mg/dL (ref 0.3–1.2)
Total Protein: 6.8 g/dL (ref 6.0–8.3)

## 2011-04-10 LAB — CBC
HCT: 40.5 % (ref 36.0–46.0)
Hemoglobin: 13.5 g/dL (ref 12.0–15.0)
RBC: 4.32 MIL/uL (ref 3.87–5.11)
RDW: 15.3 % (ref 11.5–15.5)
WBC: 5 10*3/uL (ref 4.0–10.5)

## 2011-04-10 LAB — LACTATE DEHYDROGENASE: LDH: 194 U/L (ref 94–250)

## 2011-04-10 NOTE — Progress Notes (Signed)
Labs drawn today for cbc,diff,ldh,esr

## 2011-04-13 ENCOUNTER — Encounter (HOSPITAL_BASED_OUTPATIENT_CLINIC_OR_DEPARTMENT_OTHER): Payer: Medicare Other

## 2011-04-13 ENCOUNTER — Encounter (HOSPITAL_COMMUNITY): Payer: Medicare Other | Admitting: Oncology

## 2011-04-13 VITALS — BP 135/84 | HR 101 | Temp 97.4°F | Ht 65.5 in | Wt 144.0 lb

## 2011-04-13 DIAGNOSIS — C8581 Other specified types of non-Hodgkin lymphoma, lymph nodes of head, face, and neck: Secondary | ICD-10-CM

## 2011-04-13 DIAGNOSIS — C8591 Non-Hodgkin lymphoma, unspecified, lymph nodes of head, face, and neck: Secondary | ICD-10-CM

## 2011-04-13 DIAGNOSIS — C8590 Non-Hodgkin lymphoma, unspecified, unspecified site: Secondary | ICD-10-CM

## 2011-04-13 DIAGNOSIS — Z5111 Encounter for antineoplastic chemotherapy: Secondary | ICD-10-CM

## 2011-04-13 MED ORDER — VINCRISTINE SULFATE CHEMO INJECTION 1 MG/ML
1.0000 mg | Freq: Once | INTRAVENOUS | Status: AC
  Administered 2011-04-13: 1 mg via INTRAVENOUS
  Filled 2011-04-13: qty 1

## 2011-04-13 MED ORDER — ONDANSETRON HCL 4 MG/2ML IJ SOLN
Freq: Once | INTRAMUSCULAR | Status: DC
  Filled 2011-04-13: qty 8

## 2011-04-13 MED ORDER — DEXAMETHASONE SODIUM PHOSPHATE 4 MG/ML IJ SOLN: 20.0000 mg | Freq: Once | INTRAMUSCULAR | Status: DC

## 2011-04-13 MED ORDER — ONDANSETRON 8 MG/50ML IVPB (CHCC): 16.0000 mg | Freq: Once | INTRAVENOUS | Status: DC

## 2011-04-13 MED ORDER — SODIUM CHLORIDE 0.9 % IV SOLN
375.0000 mg/m2 | Freq: Once | INTRAVENOUS | Status: AC
  Administered 2011-04-13: 700 mg via INTRAVENOUS
  Filled 2011-04-13: qty 70

## 2011-04-13 MED ORDER — SODIUM CHLORIDE 0.9 % IV SOLN
Freq: Once | INTRAVENOUS | Status: AC
  Administered 2011-04-13: 09:00:00 via INTRAVENOUS

## 2011-04-13 MED ORDER — SODIUM CHLORIDE 0.9 % IV SOLN
600.0000 mg/m2 | Freq: Once | INTRAVENOUS | Status: AC
  Administered 2011-04-13: 1060 mg via INTRAVENOUS
  Filled 2011-04-13: qty 53

## 2011-04-13 MED ORDER — SODIUM CHLORIDE 0.9 % IV SOLN
Freq: Once | INTRAVENOUS | Status: AC
  Administered 2011-04-13: 16 mg via INTRAVENOUS
  Filled 2011-04-13: qty 8

## 2011-04-13 MED ORDER — HEPARIN SOD (PORK) LOCK FLUSH 100 UNIT/ML IV SOLN
INTRAVENOUS | Status: AC
  Filled 2011-04-13: qty 5

## 2011-04-13 MED ORDER — HEPARIN SOD (PORK) LOCK FLUSH 100 UNIT/ML IV SOLN
500.0000 [IU] | Freq: Once | INTRAVENOUS | Status: AC | PRN
  Administered 2011-04-13: 500 [IU]
  Filled 2011-04-13: qty 5

## 2011-04-13 NOTE — Progress Notes (Signed)
0900 pt states took Tylenol and Benedryl pre-meds at home before appt time. 1000 Rituxan infusion begun at initial rate of 50 ml/hr x 30 mins  (100mg /hr) 1030 rituxan rate increased to 100 ml/hr x 30 min. 1100 rate increased to 150 ml/hr x 30 min  1130 rate increased to 200 ml/hr x 30 min.   1400 tolerated chemotherapy well.  Home accompanied by CNA.

## 2011-04-13 NOTE — Patient Instructions (Signed)
[facility] Specialty Clinic  Discharge Instructions  RECOMMENDATIONS MADE BY THE CONSULTANT AND ANY TEST RESULTS WILL BE SENT TO YOUR REFERRING DOCTOR.   EXAM FINDINGS BY MD TODAY AND SIGNS AND SYMPTOMS TO REPORT TO CLINIC OR PRIMARY MD:   Today you completed Cycle III of chemotherapy.   Return to Dr. Roselind Messier this Thursday November 8th @ 11 in [place].  PET scan scheduled for November 19th @ 8:30. Arrive at 7am.    I acknowledge that I have been informed and understand all the instructions given to me and received a copy. I do not have any more questions at this time, but understand that I may call the Specialty Clinic at [facility] at [phone] during business hours should I have any further questions or need assistance in obtaining follow-up care.    __________________________________________  _____________  __________ Signature of Patient or Authorized Representative            Date                   Time    __________________________________________ Nurse's Signature

## 2011-04-13 NOTE — Patient Instructions (Signed)
[facility] Specialty Clinic  Discharge Instructions  RECOMMENDATIONS MADE BY THE CONSULTANT AND ANY TEST RESULTS WILL BE SENT TO YOUR REFERRING DOCTOR.   EXAM FINDINGS BY MD TODAY AND SIGNS AND SYMPTOMS TO REPORT TO CLINIC OR PRIMARY MD:  PET scan scheduled 11/19 @ 8:30. Arrive at 7 am.   See Dr. Roselind Messier Thursday November 8th @ 11.  Return to Dr. Mariel Sleet on November 26th @ 2:30.  Today was the 3rd cycle.   I acknowledge that I have been informed and understand all the instructions given to me and received a copy. I do not have any more questions at this time, but understand that I may call the Specialty Clinic at [facility] at [phone] during business hours should I have any further questions or need assistance in obtaining follow-up care.    __________________________________________  _____________  __________ Signature of Patient or Authorized Representative            Date                   Time    __________________________________________ Nurse's Signature

## 2011-04-14 NOTE — Progress Notes (Signed)
Error

## 2011-04-15 ENCOUNTER — Inpatient Hospital Stay (HOSPITAL_COMMUNITY): Payer: Medicare Other

## 2011-04-24 ENCOUNTER — Other Ambulatory Visit (HOSPITAL_COMMUNITY): Payer: Self-pay | Admitting: Cardiovascular Disease

## 2011-04-24 ENCOUNTER — Ambulatory Visit (HOSPITAL_COMMUNITY)
Admission: RE | Admit: 2011-04-24 | Discharge: 2011-04-24 | Disposition: A | Discharge status: Home / Self Care | Payer: Medicare Other | Source: Ambulatory Visit | Attending: Cardiovascular Disease | Admitting: Cardiovascular Disease

## 2011-04-24 DIAGNOSIS — R0602 Shortness of breath: Secondary | ICD-10-CM

## 2011-04-24 DIAGNOSIS — R0609 Other forms of dyspnea: Secondary | ICD-10-CM | POA: Insufficient documentation

## 2011-04-24 DIAGNOSIS — J4489 Other specified chronic obstructive pulmonary disease: Secondary | ICD-10-CM | POA: Insufficient documentation

## 2011-04-24 DIAGNOSIS — R059 Cough, unspecified: Secondary | ICD-10-CM | POA: Insufficient documentation

## 2011-04-24 DIAGNOSIS — J449 Chronic obstructive pulmonary disease, unspecified: Secondary | ICD-10-CM | POA: Insufficient documentation

## 2011-04-24 DIAGNOSIS — R05 Cough: Secondary | ICD-10-CM | POA: Insufficient documentation

## 2011-04-24 DIAGNOSIS — R0989 Other specified symptoms and signs involving the circulatory and respiratory systems: Secondary | ICD-10-CM | POA: Insufficient documentation

## 2011-04-27 ENCOUNTER — Encounter (HOSPITAL_COMMUNITY)
Admission: RE | Admit: 2011-04-27 | Discharge: 2011-04-27 | Disposition: A | Discharge status: Home / Self Care | Payer: Medicare Other | Source: Ambulatory Visit | Attending: Oncology | Admitting: Oncology

## 2011-04-27 DIAGNOSIS — Z7982 Long term (current) use of aspirin: Secondary | ICD-10-CM | POA: Insufficient documentation

## 2011-04-27 DIAGNOSIS — C8581 Other specified types of non-Hodgkin lymphoma, lymph nodes of head, face, and neck: Secondary | ICD-10-CM | POA: Insufficient documentation

## 2011-04-27 DIAGNOSIS — Z79899 Other long term (current) drug therapy: Secondary | ICD-10-CM | POA: Insufficient documentation

## 2011-04-27 DIAGNOSIS — E785 Hyperlipidemia, unspecified: Secondary | ICD-10-CM | POA: Insufficient documentation

## 2011-04-27 DIAGNOSIS — C8591 Non-Hodgkin lymphoma, unspecified, lymph nodes of head, face, and neck: Secondary | ICD-10-CM

## 2011-04-27 DIAGNOSIS — N200 Calculus of kidney: Secondary | ICD-10-CM | POA: Insufficient documentation

## 2011-04-27 DIAGNOSIS — I10 Essential (primary) hypertension: Secondary | ICD-10-CM | POA: Insufficient documentation

## 2011-04-27 DIAGNOSIS — Z96649 Presence of unspecified artificial hip joint: Secondary | ICD-10-CM | POA: Insufficient documentation

## 2011-04-27 LAB — GLUCOSE, CAPILLARY: Glucose-Capillary: 105 mg/dL — ABNORMAL HIGH (ref 70–99)

## 2011-04-27 MED ORDER — FLUDEOXYGLUCOSE F - 18 (FDG) INJECTION
19.4000 | Freq: Once | INTRAVENOUS | Status: AC | PRN
  Administered 2011-04-27: 19.4 via INTRAVENOUS

## 2011-05-04 ENCOUNTER — Encounter (HOSPITAL_BASED_OUTPATIENT_CLINIC_OR_DEPARTMENT_OTHER): Payer: Medicare Other | Admitting: Oncology

## 2011-05-04 ENCOUNTER — Inpatient Hospital Stay (HOSPITAL_COMMUNITY): Payer: Medicare Other

## 2011-05-04 ENCOUNTER — Encounter (HOSPITAL_COMMUNITY): Payer: Self-pay | Admitting: Oncology

## 2011-05-04 VITALS — BP 124/72 | HR 77 | Temp 97.2°F | Ht 65.5 in | Wt 144.6 lb

## 2011-05-04 DIAGNOSIS — C8581 Other specified types of non-Hodgkin lymphoma, lymph nodes of head, face, and neck: Secondary | ICD-10-CM

## 2011-05-04 DIAGNOSIS — R350 Frequency of micturition: Secondary | ICD-10-CM

## 2011-05-04 MED ORDER — TOLTERODINE TARTRATE ER 2 MG PO CP24: 2.0000 mg | ORAL_CAPSULE | Freq: Every day | ORAL | Status: DC

## 2011-05-04 NOTE — Progress Notes (Signed)
This office note has been dictated.

## 2011-05-04 NOTE — Progress Notes (Signed)
CC:   Sierra Good, Ph.D., M.D. 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  She had CD20 positive disease and was treated with 3 cycles of RCVP.  We had to use modified doses for cycles 2 and 3 because after cycle 1 she ended up in the hospital with neutropenia, weakness, fatigue and atrial fibrillation.  We have done her PET scan which just got done the other day, and her PET scan shows tremendous improvement.  There is essentially complete disappearance of all activity.  There is a little bit of background activity in the nasal area only and that is all.  The lymph node has decreased in size from 15 mm to 6 mm.  The other 1 has decreased from 10 mm to 6 mm.  FDG activity is also background activity level.  I think therefore we have had great response and she is ready to move on with radiation.  I have tried to call Dr. Roselind Messier but he is unavailable at this time.  She is due to have 1 tooth removed locally tomorrow after seeing Dr. Kristin Bruins in consultation.  Her labs that were done on the 2nd prior to the last cycle really were outstanding with a normal LDH, total protein was normal.  Albumin barely low at 3.4.  Electrolytes, BUN and creatinine and calcium were all excellent.  CBC and diff were excellent as well.  Sedimentation rate was 28.  I am very pleased with how she has done overall.  Ready to move on with radiation.  We hope that she gets a long disease free if not complete remission that lasts forever.  I will see her in mid January.  Will probably do blood work then but I think a PET scan 6 months after completion of radiation would be in order as our new baseline.  We will see her of course in January and go from there.  She is having urinary frequency and I gave her Detrol LA 2 mg once a day.  If  it helps great, if it does not we could try the 4 mg tablet but I want her work her way up because of her age to 4 mg only if we needed to.    ______________________________ Ladona Horns. Mariel Sleet, MD ESN/MEDQ  D:  05/04/2011  T:  05/04/2011  Job:  [id number]

## 2011-05-04 NOTE — Patient Instructions (Signed)
Sierra Good  [id number] Jun 13, 1922   [facility]  Discharge Instructions  RECOMMENDATIONS MADE BY THE CONSULTANT AND ANY TEST RESULTS WILL BE SENT TO YOUR REFERRING DOCTOR.   EXAM FINDINGS BY MD TODAY AND SIGNS AND SYMPTOMS TO REPORT TO CLINIC OR PRIMARY MD: Per Dr. Mariel Sleet, you exam findings are good.  Please call him if you have any concerns during your radiation therapy; otherwise, he will see you this upcoming January.  Please keep your appointment with [place].  MEDICATIONS PRESCRIBED: Detrol LA 2mg   I acknowledge that I have been informed and understand all the instructions given to me and received a copy. I do not have any more questions at this time, but understand that I may call the Specialty Clinic at [facility] at [phone] during business hours should I have any further questions or need assistance in obtaining follow-up care.    __________________________________________  _____________  __________ Signature of Patient or Authorized Representative            Date                   Time    __________________________________________ Nurse's Signature

## 2011-05-07 ENCOUNTER — Encounter: Payer: Self-pay | Admitting: Physician Assistant

## 2011-05-18 ENCOUNTER — Ambulatory Visit (HOSPITAL_COMMUNITY): Payer: Self-pay | Admitting: Dentistry

## 2011-05-18 ENCOUNTER — Telehealth (HOSPITAL_COMMUNITY): Payer: Self-pay | Admitting: *Deleted

## 2011-05-18 VITALS — BP 146/69 | HR 64 | Temp 97.1°F

## 2011-05-18 DIAGNOSIS — K08109 Complete loss of teeth, unspecified cause, unspecified class: Secondary | ICD-10-CM

## 2011-05-18 DIAGNOSIS — Z463 Encounter for fitting and adjustment of dental prosthetic device: Secondary | ICD-10-CM

## 2011-05-18 DIAGNOSIS — Z0189 Encounter for other specified special examinations: Secondary | ICD-10-CM

## 2011-05-18 DIAGNOSIS — K08409 Partial loss of teeth, unspecified cause, unspecified class: Secondary | ICD-10-CM

## 2011-05-18 NOTE — Telephone Encounter (Signed)
Patient's insurance will no longer cover Detrol LA. 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 The other choice they said would be Ditropan 5mg  bid. Please let me know what we should do. The only other choice would be to write a letter to her insurance company regarding her need to stay on Detrol LA. Family says it is working very well for her.

## 2011-05-18 NOTE — Progress Notes (Signed)
Monday, May 18, 2011  BP 146/69  Pulse 64  Temp(Src) 97.1 F (36.2 C) (Oral)  Sierra Good presents for insertion of upper and lower fluoride trays and scatter protection devices . Patient had tooth #17 extracted on 05/14/2011 at Dr. Chales Salmon. Patient also had biopsy of the floor of mouth at that time. Pathology results are pending per Dr. Frederik Pear office today. Called Dr. Roselind Messier and he indicated that patient is to be given the scatter protection devices to take to the simulation appointment in [place], [place].  This appointment is pending outcome of the biopsy of the left floor of mouth.  Procedure: Appliances tried in and were adjusted as needed. [place]. Trismus device fabricated as well with maximum interincisal opening of 50 mm and use of 30 sticks. Postop instructions were provided in a written and verbal format for use and care of appliances. All questions answered. Patient was provided a prescription for the FluoriSHIELD sodium fluoride. Return to clinic for periodic oral exam during radiation therapy if patient so desires.  Call if questions or problems before then. Dr. Kristin Bruins

## 2011-05-27 ENCOUNTER — Ambulatory Visit (HOSPITAL_COMMUNITY)
Admission: RE | Admit: 2011-05-27 | Discharge: 2011-05-27 | Disposition: A | Discharge status: Home / Self Care | Payer: Medicare Other | Source: Ambulatory Visit | Attending: Internal Medicine | Admitting: Internal Medicine

## 2011-05-27 DIAGNOSIS — R0602 Shortness of breath: Secondary | ICD-10-CM | POA: Insufficient documentation

## 2011-05-27 NOTE — Procedures (Signed)
[name], Sierra Good             ACCOUNT NO.:  [id number]  MEDICAL RECORD NO.:  [id number]  LOCATION:  RESP                          FACILITY:  [facility]  PHYSICIAN:  Garnetta Fedrick L. Juanetta Gosling, M.D.DATE OF BIRTH:  [birth year]  DATE OF PROCEDURE: DATE OF DISCHARGE:                           PULMONARY FUNCTION TEST   Reason for pulmonary function testing is shortness of breath. 1. Spirometry is normal. 2. Lung volumes are normal. 3. DLCO is moderately reduced, but corrects to some extent when     corrected for alveolar ventilation. 4. There is no definite cause of shortness of breath seen on this     study.     Briann Sarchet L. Juanetta Gosling, M.D.     ELH/MEDQ  D:  05/27/2011  T:  05/27/2011  Job:  [id number]

## 2011-06-05 ENCOUNTER — Ambulatory Visit (HOSPITAL_COMMUNITY): Payer: Self-pay | Admitting: Oncology

## 2011-07-02 ENCOUNTER — Ambulatory Visit (INDEPENDENT_AMBULATORY_CARE_PROVIDER_SITE_OTHER): Payer: Self-pay | Admitting: Otolaryngology

## 2011-07-06 ENCOUNTER — Encounter (HOSPITAL_COMMUNITY): Payer: Medicare Other | Admitting: Oncology

## 2011-07-10 ENCOUNTER — Telehealth (HOSPITAL_COMMUNITY): Payer: Self-pay | Admitting: Oncology

## 2011-07-23 ENCOUNTER — Ambulatory Visit (INDEPENDENT_AMBULATORY_CARE_PROVIDER_SITE_OTHER): Payer: Medicare Other | Admitting: Otolaryngology

## 2011-07-23 DIAGNOSIS — C8581 Other specified types of non-Hodgkin lymphoma, lymph nodes of head, face, and neck: Secondary | ICD-10-CM

## 2011-07-27 ENCOUNTER — Ambulatory Visit (HOSPITAL_COMMUNITY): Payer: Medicare Other | Admitting: Oncology

## 2011-08-03 ENCOUNTER — Other Ambulatory Visit (HOSPITAL_COMMUNITY): Payer: Self-pay | Admitting: Oncology

## 2011-08-03 ENCOUNTER — Encounter (HOSPITAL_COMMUNITY): Payer: Medicare Other | Attending: Oncology | Admitting: Oncology

## 2011-08-03 VITALS — BP 175/74 | HR 49 | Temp 98.0°F | Wt 149.6 lb

## 2011-08-03 DIAGNOSIS — C8581 Other specified types of non-Hodgkin lymphoma, lymph nodes of head, face, and neck: Secondary | ICD-10-CM

## 2011-08-03 DIAGNOSIS — K1230 Oral mucositis (ulcerative), unspecified: Secondary | ICD-10-CM | POA: Insufficient documentation

## 2011-08-03 DIAGNOSIS — Y842 Radiological procedure and radiotherapy as the cause of abnormal reaction of the patient, or of later complication, without mention of misadventure at the time of the procedure: Secondary | ICD-10-CM | POA: Insufficient documentation

## 2011-08-03 DIAGNOSIS — C8589 Other specified types of non-Hodgkin lymphoma, extranodal and solid organ sites: Secondary | ICD-10-CM | POA: Insufficient documentation

## 2011-08-03 DIAGNOSIS — C8591 Non-Hodgkin lymphoma, unspecified, lymph nodes of head, face, and neck: Secondary | ICD-10-CM

## 2011-08-03 DIAGNOSIS — K121 Other forms of stomatitis: Secondary | ICD-10-CM | POA: Insufficient documentation

## 2011-08-03 LAB — COMPREHENSIVE METABOLIC PANEL
ALT: 16 U/L (ref 0–35)
CO2: 28 mEq/L (ref 19–32)
Calcium: 10.4 mg/dL (ref 8.4–10.5)
Creatinine, Ser: 0.77 mg/dL (ref 0.50–1.10)
GFR calc Af Amer: 86 mL/min — ABNORMAL LOW (ref >90)
GFR calc non Af Amer: 74 mL/min — ABNORMAL LOW (ref >90)
Glucose, Bld: 66 mg/dL — ABNORMAL LOW (ref 70–99)
Total Bilirubin: 0.2 mg/dL — ABNORMAL LOW (ref 0.3–1.2)

## 2011-08-03 LAB — DIFFERENTIAL
Eosinophils Relative: 3 % (ref 0–5)
Lymphocytes Relative: 10 % — ABNORMAL LOW (ref 12–46)
Lymphs Abs: 0.6 10*3/uL — ABNORMAL LOW (ref 0.7–4.0)
Monocytes Absolute: 0.5 10*3/uL (ref 0.1–1.0)
Monocytes Relative: 9 % (ref 3–12)

## 2011-08-03 LAB — CBC: MCH: 31.3 pg (ref 26.0–34.0)

## 2011-08-03 NOTE — Progress Notes (Signed)
[place] Popiel presented for [employer]. Labs per MD order drawn via Peripheral Line 23 gauge needle inserted in left antecubital.  Good blood return present. Procedure without incident.  Needle removed intact. Patient tolerated procedure well.

## 2011-08-03 NOTE — Progress Notes (Signed)
This office note has been dictated.

## 2011-08-03 NOTE — Progress Notes (Signed)
CC:   Delaney Meigs, M.D. Billie Lade, Ph.D., M.D. Thurmon Fair, MD [name], MD  DIAGNOSES: 1. Stage IAE versus stage II diffuse large B-cell lymphoma occurring     predominantly in the nasal cavity on the right.  One lymph node     group in the neck on the right was involved, CD20 positive disease     treated with 3 cycles of R-CVP and we had to use modified doses for     cycles 2 and 3 because of neutropenia, weakness, fatigue, and     atrial fibrillation after cycle 1.  She has now finished radiation     therapy.  She is here for followup. 2. Mucositis secondary to the radiation therapy.  [place] is looking better, still very weak in the legs.  She is using a walker at home but she does look better than the last time I saw her.  Her vital signs are all very stable but her mouth is very sore and there is a thickened amount of phlegm in her throat and redness at the base of the throat and soft palate and left tonsillar area in particular.  There looks like there is some candidiasis present, small little white patches on both sides of the soft palate.  She is not febrile.  Blood pressure 175/74 right arm, sitting position. Pulse right around 49 to 55 and irregular.  She is alert.  She is oriented.  I did not detect any adenopathy in the cervical or supraclavicular, or infraclavicular, or axillary areas.  She had no groin nodes.  Heart showed an irregular rhythm but normal rate.  I did not hear an S3 gallop.  Her lungs were clear.  Abdomen was soft and nontender.  It will be time to do a PET scan probably 8 weeks after completion of radiation which was complete on February 7th.  I am going to call her in Diflucan 7 days' worth of 100 mg each.  She will continue use [employer] Magic mouthwash.  She does need some labs today to see whether electrolytes are okay, etc.  I will see her right after the PET scan.    ______________________________ Ladona Horns. Mariel Sleet,  MD ESN/MEDQ  D:  08/03/2011  T:  08/03/2011  Job:  [id number]

## 2011-08-03 NOTE — Patient Instructions (Signed)
[facility]  Discharge Instructions  RECOMMENDATIONS MADE BY THE CONSULTANT AND ANY TEST RESULTS WILL BE SENT TO YOUR REFERRING DOCTOR.  We will draw some lab work today, we will call you if there are any abnormal results. We will schedule you for a PET scan in April. Take the Diflucan as ordered, let us know next week if you are no better. Report any issues or concerns to the clinic as needed.  I acknowledge that I have been informed and understand all the instructions given to me and received a copy. I do not have any more questions at this time, but understand that I may call the Specialty Clinic at [facility] at [phone] during business hours should I have any further questions or need assistance in obtaining follow-up care.    __________________________________________  _____________  __________ Signature of Patient or Authorized Representative            Date                   Time    __________________________________________ Nurse's Signature

## 2011-08-07 ENCOUNTER — Telehealth (HOSPITAL_COMMUNITY): Payer: Self-pay | Admitting: Oncology

## 2011-08-07 ENCOUNTER — Telehealth (HOSPITAL_COMMUNITY): Payer: Self-pay | Admitting: *Deleted

## 2011-08-07 NOTE — Telephone Encounter (Signed)
Per Dr. Mariel Sleet, patient was contacted and advised to use Magic Mouthwash, 10-15cc 5 x daily.  Patient verbalized understanding of her instructions and also stated she currently does not need a refill on the medication.

## 2011-09-17 ENCOUNTER — Encounter (HOSPITAL_COMMUNITY)
Admission: RE | Admit: 2011-09-17 | Discharge: 2011-09-17 | Disposition: A | Discharge status: Home / Self Care | Payer: Medicare Other | Source: Ambulatory Visit | Attending: Oncology | Admitting: Oncology

## 2011-09-17 ENCOUNTER — Encounter (HOSPITAL_COMMUNITY): Payer: Self-pay

## 2011-09-17 DIAGNOSIS — K449 Diaphragmatic hernia without obstruction or gangrene: Secondary | ICD-10-CM | POA: Insufficient documentation

## 2011-09-17 DIAGNOSIS — Z96649 Presence of unspecified artificial hip joint: Secondary | ICD-10-CM | POA: Insufficient documentation

## 2011-09-17 DIAGNOSIS — M899 Disorder of bone, unspecified: Secondary | ICD-10-CM | POA: Insufficient documentation

## 2011-09-17 DIAGNOSIS — C8591 Non-Hodgkin lymphoma, unspecified, lymph nodes of head, face, and neck: Secondary | ICD-10-CM

## 2011-09-17 DIAGNOSIS — C8589 Other specified types of non-Hodgkin lymphoma, extranodal and solid organ sites: Secondary | ICD-10-CM | POA: Insufficient documentation

## 2011-09-17 DIAGNOSIS — Z9221 Personal history of antineoplastic chemotherapy: Secondary | ICD-10-CM | POA: Insufficient documentation

## 2011-09-17 DIAGNOSIS — I2510 Atherosclerotic heart disease of native coronary artery without angina pectoris: Secondary | ICD-10-CM | POA: Insufficient documentation

## 2011-09-17 DIAGNOSIS — I517 Cardiomegaly: Secondary | ICD-10-CM | POA: Insufficient documentation

## 2011-09-17 LAB — GLUCOSE, CAPILLARY: Glucose-Capillary: 104 mg/dL — ABNORMAL HIGH (ref 70–99)

## 2011-09-17 MED ORDER — FLUDEOXYGLUCOSE F - 18 (FDG) INJECTION
17.2000 | Freq: Once | INTRAVENOUS | Status: AC | PRN
  Administered 2011-09-17: 17.2 via INTRAVENOUS

## 2011-09-18 ENCOUNTER — Encounter (HOSPITAL_COMMUNITY): Payer: Medicare Other | Admitting: Oncology

## 2011-09-18 ENCOUNTER — Ambulatory Visit (HOSPITAL_COMMUNITY): Payer: Medicare Other | Admitting: Oncology

## 2011-09-18 ENCOUNTER — Encounter (HOSPITAL_BASED_OUTPATIENT_CLINIC_OR_DEPARTMENT_OTHER): Payer: Medicare Other | Admitting: Oncology

## 2011-09-18 ENCOUNTER — Encounter (HOSPITAL_COMMUNITY): Payer: Medicare Other | Attending: Oncology

## 2011-09-18 ENCOUNTER — Encounter (HOSPITAL_COMMUNITY): Payer: Self-pay | Admitting: Oncology

## 2011-09-18 VITALS — BP 148/81 | HR 61 | Temp 97.7°F | Wt 147.8 lb

## 2011-09-18 DIAGNOSIS — C8591 Non-Hodgkin lymphoma, unspecified, lymph nodes of head, face, and neck: Secondary | ICD-10-CM

## 2011-09-18 DIAGNOSIS — Y842 Radiological procedure and radiotherapy as the cause of abnormal reaction of the patient, or of later complication, without mention of misadventure at the time of the procedure: Secondary | ICD-10-CM | POA: Insufficient documentation

## 2011-09-18 DIAGNOSIS — C8581 Other specified types of non-Hodgkin lymphoma, lymph nodes of head, face, and neck: Secondary | ICD-10-CM

## 2011-09-18 DIAGNOSIS — I878 Other specified disorders of veins: Secondary | ICD-10-CM

## 2011-09-18 LAB — COMPREHENSIVE METABOLIC PANEL
ALT: 12 U/L (ref 0–35)
AST: 16 U/L (ref 0–37)
Albumin: 3.6 g/dL (ref 3.5–5.2)
CO2: 26 mEq/L (ref 19–32)
Calcium: 10.1 mg/dL (ref 8.4–10.5)
Creatinine, Ser: 0.92 mg/dL (ref 0.50–1.10)
GFR calc non Af Amer: 55 mL/min — ABNORMAL LOW (ref >90)
Sodium: 137 mEq/L (ref 135–145)
Total Protein: 6.7 g/dL (ref 6.0–8.3)

## 2011-09-18 LAB — DIFFERENTIAL
Basophils Absolute: 0 10*3/uL (ref 0.0–0.1)
Basophils Relative: 0 % (ref 0–1)
Eosinophils Relative: 4 % (ref 0–5)
Lymphocytes Relative: 10 % — ABNORMAL LOW (ref 12–46)

## 2011-09-18 LAB — CBC
MCHC: 33.6 g/dL (ref 30.0–36.0)
MCV: 93.5 fL (ref 78.0–100.0)
Platelets: 234 10*3/uL (ref 150–400)
RDW: 14.7 % (ref 11.5–15.5)
WBC: 7.3 10*3/uL (ref 4.0–10.5)

## 2011-09-18 LAB — SEDIMENTATION RATE: Sed Rate: 15 mm/hr (ref 0–22)

## 2011-09-18 MED ORDER — SODIUM CHLORIDE 0.9 % IJ SOLN
INTRAMUSCULAR | Status: AC
  Filled 2011-09-18: qty 10

## 2011-09-18 MED ORDER — SODIUM CHLORIDE 0.9 % IJ SOLN
10.0000 mL | INTRAMUSCULAR | Status: DC | PRN
  Administered 2011-09-18: 10 mL via INTRAVENOUS

## 2011-09-18 MED ORDER — HEPARIN SOD (PORK) LOCK FLUSH 100 UNIT/ML IV SOLN
500.0000 [IU] | Freq: Once | INTRAVENOUS | Status: AC
  Administered 2011-09-18: 500 [IU] via INTRAVENOUS

## 2011-09-18 MED ORDER — HEPARIN SOD (PORK) LOCK FLUSH 100 UNIT/ML IV SOLN
INTRAVENOUS | Status: AC
  Filled 2011-09-18: qty 5

## 2011-09-18 NOTE — Progress Notes (Signed)
Sierra Good presented for Portacath access and flush. Proper placement of portacath confirmed by CXR. Portacath located left chest wall accessed with  H 20 needle. Good blood return present. Portacath flushed with 20ml NS and 500U/5ml Heparin and needle removed intact. Procedure without incident. Patient tolerated procedure well.   

## 2011-09-18 NOTE — Progress Notes (Signed)
The patient is here today with her daughter to go over the results of her PET scan which shows no evidence for persistence of disease. She has developed a rib fracture from a fall. Her labs are also excellent. She remains weak tired and has difficulty walking as was getting up from a chair or the bed. She also has a rash on her right leg which is very pruritic. There are papule and patches of slightly raised and red areas.  Otherwise she has tremendous weakness of her thighs and hip girdle musculature. Her arms are strong as are her shoulders.  Her labs are great today as is the PET scan. She needs a dermatology consultation, physical therapy evaluation and consultation, and an appointment to see [place] back in 3 months.

## 2011-09-18 NOTE — Progress Notes (Signed)
Addended by: Sterling Big on: 09/18/2011 02:52 PM   Modules accepted: Orders, SmartSet

## 2011-09-18 NOTE — Progress Notes (Signed)
Error

## 2011-09-18 NOTE — Progress Notes (Signed)
[place] Burrowes presented for [employer]. Labs per MD order drawn via Peripheral Line 23 gauge needle inserted in left AC  Good blood return present. Procedure without incident.  Needle removed intact. Patient tolerated procedure well.

## 2011-09-18 NOTE — Patient Instructions (Signed)
Sierra Good  [id number] 1923/02/20   [facility]  Discharge Instructions  RECOMMENDATIONS MADE BY THE CONSULTANT AND ANY TEST RESULTS WILL BE SENT TO YOUR REFERRING DOCTOR.   EXAM FINDINGS BY MD TODAY AND SIGNS AND SYMPTOMS TO REPORT TO CLINIC OR PRIMARY MD: Your labs are ok so not sure why you are dizzy.  We will make a referral to Home Health for Physical Therapy consult for strengthening and ambulation assistance.  MEDICATIONS PRESCRIBED: none   INSTRUCTIONS GIVEN AND DISCUSSED:   SPECIAL INSTRUCTIONS/FOLLOW-UP: Port flush every 6 weeks. Return to Clinic in 3 months to see MD.   I acknowledge that I have been informed and understand all the instructions given to me and received a copy. I do not have any more questions at this time, but understand that I may call the Specialty Clinic at [facility] at [phone] during business hours should I have any further questions or need assistance in obtaining follow-up care.    __________________________________________  _____________  __________ Signature of Patient or Authorized Representative            Date                   Time    __________________________________________ Nurse's Signature

## 2011-09-19 ENCOUNTER — Emergency Department (HOSPITAL_COMMUNITY)
Admission: EM | Admit: 2011-09-19 | Discharge: 2011-09-19 | Disposition: A | Discharge status: Home / Self Care | Payer: Medicare Other | Attending: Emergency Medicine | Admitting: Emergency Medicine

## 2011-09-19 ENCOUNTER — Emergency Department (HOSPITAL_COMMUNITY): Payer: Medicare Other

## 2011-09-19 ENCOUNTER — Encounter (HOSPITAL_COMMUNITY): Payer: Self-pay | Admitting: *Deleted

## 2011-09-19 DIAGNOSIS — I10 Essential (primary) hypertension: Secondary | ICD-10-CM | POA: Insufficient documentation

## 2011-09-19 DIAGNOSIS — K219 Gastro-esophageal reflux disease without esophagitis: Secondary | ICD-10-CM | POA: Insufficient documentation

## 2011-09-19 DIAGNOSIS — S0101XA Laceration without foreign body of scalp, initial encounter: Secondary | ICD-10-CM

## 2011-09-19 DIAGNOSIS — E785 Hyperlipidemia, unspecified: Secondary | ICD-10-CM | POA: Insufficient documentation

## 2011-09-19 DIAGNOSIS — S0100XA Unspecified open wound of scalp, initial encounter: Secondary | ICD-10-CM | POA: Insufficient documentation

## 2011-09-19 DIAGNOSIS — T45515A Adverse effect of anticoagulants, initial encounter: Secondary | ICD-10-CM

## 2011-09-19 DIAGNOSIS — W19XXXA Unspecified fall, initial encounter: Secondary | ICD-10-CM | POA: Insufficient documentation

## 2011-09-19 DIAGNOSIS — D689 Coagulation defect, unspecified: Secondary | ICD-10-CM | POA: Insufficient documentation

## 2011-09-19 DIAGNOSIS — Z79899 Other long term (current) drug therapy: Secondary | ICD-10-CM | POA: Insufficient documentation

## 2011-09-19 DIAGNOSIS — G319 Degenerative disease of nervous system, unspecified: Secondary | ICD-10-CM | POA: Insufficient documentation

## 2011-09-19 LAB — PROTIME-INR
INR: 2.26 — ABNORMAL HIGH (ref 0.00–1.49)
Prothrombin Time: 25.3 seconds — ABNORMAL HIGH (ref 11.6–15.2)

## 2011-09-19 MED ORDER — ACETAMINOPHEN 500 MG PO TABS
ORAL_TABLET | ORAL | Status: AC
  Administered 2011-09-19: 1000 mg
  Filled 2011-09-19: qty 2

## 2011-09-19 MED ORDER — LIDOCAINE-EPINEPHRINE (PF) 1 %-1:200000 IJ SOLN
INTRAMUSCULAR | Status: AC
  Filled 2011-09-19: qty 10

## 2011-09-19 NOTE — Discharge Instructions (Signed)
Keep the wound clean and dry. The sutures need to be removed in 1 week. She needs to be rechecked for any problems listed on the head injury sheet. Her INR was therapeutic today at 2.26.

## 2011-09-19 NOTE — ED Notes (Signed)
EDP aware family would like to speak with her due to increased bleeding after suture. Area is bleeding at this time. Pressure held by Sharol Roussel RN.

## 2011-09-19 NOTE — ED Notes (Addendum)
Pt fell and hit her head on the corner of a concrete planter. Pt has laceration to the back of her head. No bleeding at this time. Pt denies loss of consciousness. Pt on Coumadin.

## 2011-09-19 NOTE — ED Provider Notes (Signed)
History     CSN: [id number]  Arrival date & time 09/19/11  1706   First MD Initiated Contact with Patient 09/19/11 1800      Chief Complaint  Patient presents with  . Head Laceration    (Consider location/radiation/quality/duration/timing/severity/associated sxs/prior treatment) HPI  Patient relates she was sitting by herself on the porch and she tried to get up. She states a rail was out of her reach and she lost her balance and fell backwards. Her family states the back of her head hit a concrete planter. She denies loss of consciousness. She states she did have a headache but it's better now. She denies any other injury. She denies nausea, vomiting, or blurred or change in vision. He reports she's fallen 3 times because she will not ask for help. Patient recently finished chemotherapy therapy and radiation therapy for stage I non-Hodgkin's lymphoma that was found in her sinus. They report she has been weak since that treatment.  Oncologist Dr. Mariel Sleet Radiologist Dr. Moreen Fowler  Past Medical History  Diagnosis Date  . GERD (gastroesophageal reflux disease)     takes omeprazole daily  . Hyperlipidemia   . Hypertension   . Osteoarthritis of lumbar spine   . PONV (postoperative nausea and vomiting) 2009    after total hip  . Deviated nasal septum     pt had septoplasty  . Nasal turbinate hypertrophy     bilateral  . Cancer     nasal cancer  . Cancer     cancer in the nose and in lower jaw  . Atrial fibrillation   . Dizziness   . Itching     Past Surgical History  Procedure Date  . Total hip arthroplasty 2009    Right, [facility] (general)  . Bunionectomy 20+yrs ago    bilateral, [facility]  . Hammer toe surgery     20+ yrs ago  . Cataract extraction w/ intraocular lens  implant, bilateral 2010    [facility]  . Nasal septoplasty w/ turbinoplasty 12/18/2010    Procedure: NASAL SEPTOPLASTY WITH TURBINATE REDUCTION;  Surgeon: Darletta Moll;  Location: [facility] ORS;  Service: ENT;  Laterality:  Bilateral;  . Colon surgery 2010    hemicoloctomy-flat polyp removed at [facility]  . Portacath placement 02/10/2011    Procedure: INSERTION PORT-A-CATH;  Surgeon: Marlane Hatcher;  Location: [facility] ORS;  Service: General;  Laterality: Left;  left subclavian    Family History  Problem Relation Age of Onset  . Pseudochol deficiency Neg Hx   . Malignant hyperthermia Neg Hx   . Hypotension Neg Hx   . Anesthesia problems Neg Hx   . Cancer Mother   . Stroke Father     History  Substance Use Topics  . Smoking status: Never Smoker   . Smokeless tobacco: Never Used  . Alcohol Use: No  lives with family for now  OB History    Grav Para Term Preterm Abortions TAB SAB Ect Mult Living                  Review of Systems  All other systems reviewed and are negative.    Allergies  Statins  Home Medications   Current Outpatient Rx  Name Route Sig Dispense Refill  . CALTRATE 600 PLUS-VIT D PO Oral Take 1 tablet by mouth daily.    . CENTRUM SILVER ULTRA WOMENS PO TABS Oral Take 1 tablet by mouth daily.      Marland Kitchen OMEPRAZOLE 20 MG PO CPDR Oral  Take 20 mg by mouth Daily.    Marland Kitchen SOLIFENACIN SUCCINATE 5 MG PO TABS Oral Take 10 mg by mouth daily.    . WARFARIN SODIUM 4 MG PO TABS Oral Take 4 mg by mouth daily.      BP 163/67  Pulse 72  Temp(Src) 97.7 F (36.5 C) (Oral)  Resp 18  Ht 5' 6.5" (1.689 m)  Wt 145 lb (65.772 kg)  BMI 23.05 kg/m2  SpO2 98%  Vital signs normal    Physical Exam  Constitutional: She is oriented to person, place, and time. She appears well-developed and well-nourished.  Non-toxic appearance. She does not appear ill. No distress.  HENT:  Head: Normocephalic.  Right Ear: External ear normal.  Left Ear: External ear normal.  Nose: Nose normal. No mucosal edema or rhinorrhea.  Mouth/Throat: Oropharynx is clear and moist and mucous membranes are normal. No dental abscesses or uvula swelling.       She appeared to have a puncture type wound to the posterior scalp.    Eyes: Conjunctivae and EOM are normal. Pupils are equal, round, and reactive to light.  Neck: Normal range of motion and full passive range of motion without pain. Neck supple.  Cardiovascular: Normal rate, regular rhythm and normal heart sounds.  Exam reveals no gallop and no friction rub.   No murmur heard. Pulmonary/Chest: Effort normal and breath sounds normal. No respiratory distress. She has no wheezes. She has no rhonchi. She has no rales. She exhibits no tenderness and no crepitus.  Abdominal: Soft. Normal appearance and bowel sounds are normal. She exhibits no distension. There is no tenderness. There is no rebound and no guarding.  Musculoskeletal: Normal range of motion. She exhibits no edema and no tenderness.       Moves all extremities well.   Neurological: She is alert and oriented to person, place, and time. She has normal strength. No cranial nerve deficit.  Skin: Skin is warm, dry and intact. No rash noted. No erythema. No pallor.  Psychiatric: She has a normal mood and affect. Her speech is normal and behavior is normal. Her mood appears not anxious.    ED Course  Procedures (including critical care time)   Medications  lidocaine-EPINEPHrine (XYLOCAINE-EPINEPHrine) 1 %-1:200000 (with pres) injection (not administered)  acetaminophen (TYLENOL) 500 MG tablet (1000 mg  Given 09/19/11 1850)   Family states she has vicodin at home to take.    Her wound appeared to be a small puncture-type wound. We discussed it might need just one staple placement. It was placed without anesthesia. However after was placed she started having more active bleeding. This upset the family.   LACERATION REPAIR Performed by: Ward Givens Authorized by: Ward Givens Consent: Verbal consent obtained. Risks and benefits: risks, benefits and alternatives were discussed Consent given by: patient Patient identity confirmed: provided demographic data Prepped and Draped in normal sterile  fashion Wound explored  Laceration Location: upper scalp    Laceration Length: 1 cm  No Foreign Bodies seen or palpated  Anesthesia: local infiltration  Local anesthetic: lidocaine 1% 1 epinephrine  Anesthetic total: 10 ml  Irrigation method: syringe Amount of cleaning: standard  Skin closure: 4-0 nylon simple interrupted with deep bites  Number of sutures: 4  No further bleeding was seen after sutures were placed.   I had difficulty having the prior staple, I used a staple remover without success and then had to use the needle holder and finally was able to get out of scalp.  Patient did not tolerate that well despite having anesthesia.     Labs Reviewed  PROTIME-INR - Abnormal; Notable for the following:    Prothrombin Time 25.3 (*)    INR 2.26 (*)    All other components within normal limits   Laboratory interpretation all normal except therapeutic coumadin   Ct Head Wo Contrast  09/19/2011  *RADIOLOGY REPORT*  Clinical Data: 78 year old female with headache following head injury.  Patient on Coumadin.  CT HEAD WITHOUT CONTRAST  Technique:  Contiguous axial images were obtained from the base of the skull through the vertex without contrast.  Comparison: 07/07/2006 CT  Findings: Mild generalized cerebral volume loss and mild to moderate chronic small vessel white matter ischemic changes are again noted.  No acute intracranial abnormalities are identified, including mass lesion or mass effect, hydrocephalus, extra-axial fluid collection, midline shift, hemorrhage, or acute infarction.  The visualized bony calvarium is unremarkable. High posterior scalp soft tissue swelling is noted without underlying fracture.  IMPRESSION: No evidence of acute intracranial abnormality.  High scalp soft tissue swelling without fracture.  Atrophy and chronic small vessel white matter ischemic changes.  Original Report Authenticated By: Rosendo Gros, M.D.     1. Fall   2. Scalp laceration    3. Warfarin-induced coagulopathy    Plan discharge  Devoria Albe, MD, FACEP    MDM          Ward Givens, MD 09/19/11 2005

## 2011-09-19 NOTE — ED Notes (Signed)
Pt and family concerned that staple is actually in the wound and not holding the wound together. EDP in to suture wound now.

## 2011-10-30 ENCOUNTER — Encounter (HOSPITAL_COMMUNITY): Payer: Medicare Other | Attending: Oncology

## 2011-10-30 DIAGNOSIS — C8591 Non-Hodgkin lymphoma, unspecified, lymph nodes of head, face, and neck: Secondary | ICD-10-CM

## 2011-10-30 DIAGNOSIS — Z452 Encounter for adjustment and management of vascular access device: Secondary | ICD-10-CM

## 2011-10-30 DIAGNOSIS — C8581 Other specified types of non-Hodgkin lymphoma, lymph nodes of head, face, and neck: Secondary | ICD-10-CM

## 2011-10-30 MED ORDER — SODIUM CHLORIDE 0.9 % IJ SOLN
INTRAMUSCULAR | Status: AC
  Filled 2011-10-30: qty 10

## 2011-10-30 MED ORDER — HEPARIN SOD (PORK) LOCK FLUSH 100 UNIT/ML IV SOLN
500.0000 [IU] | Freq: Once | INTRAVENOUS | Status: AC
  Administered 2011-10-30: 500 [IU] via INTRAVENOUS
  Filled 2011-10-30: qty 5

## 2011-10-30 MED ORDER — HEPARIN SOD (PORK) LOCK FLUSH 100 UNIT/ML IV SOLN
INTRAVENOUS | Status: AC
  Filled 2011-10-30: qty 5

## 2011-10-30 MED ORDER — SODIUM CHLORIDE 0.9 % IJ SOLN
10.0000 mL | INTRAMUSCULAR | Status: DC | PRN
  Administered 2011-10-30: 10 mL via INTRAVENOUS
  Filled 2011-10-30: qty 10

## 2011-10-30 NOTE — Progress Notes (Signed)
Sierra Good presented for Portacath access and flush. Proper placement of portacath confirmed by CXR. Portacath located left chest wall accessed with  H 20 needle. Good blood return present. Portacath flushed with 20ml NS and 500U/5ml Heparin and needle removed intact. Procedure without incident. Patient tolerated procedure well.   

## 2011-11-07 HISTORY — PX: OTHER SURGICAL HISTORY: SHX169

## 2011-12-11 ENCOUNTER — Encounter (HOSPITAL_COMMUNITY): Payer: Medicare Other | Attending: Oncology | Admitting: Oncology

## 2011-12-11 ENCOUNTER — Encounter (HOSPITAL_COMMUNITY): Payer: Medicare Other

## 2011-12-11 ENCOUNTER — Encounter (HOSPITAL_COMMUNITY): Payer: Self-pay | Admitting: Oncology

## 2011-12-11 VITALS — BP 160/78 | HR 68 | Temp 97.4°F | Wt 143.2 lb

## 2011-12-11 DIAGNOSIS — L989 Disorder of the skin and subcutaneous tissue, unspecified: Secondary | ICD-10-CM | POA: Insufficient documentation

## 2011-12-11 DIAGNOSIS — C8591 Non-Hodgkin lymphoma, unspecified, lymph nodes of head, face, and neck: Secondary | ICD-10-CM

## 2011-12-11 DIAGNOSIS — C8581 Other specified types of non-Hodgkin lymphoma, lymph nodes of head, face, and neck: Secondary | ICD-10-CM

## 2011-12-11 DIAGNOSIS — I4891 Unspecified atrial fibrillation: Secondary | ICD-10-CM

## 2011-12-11 DIAGNOSIS — I878 Other specified disorders of veins: Secondary | ICD-10-CM

## 2011-12-11 DIAGNOSIS — I998 Other disorder of circulatory system: Secondary | ICD-10-CM

## 2011-12-11 LAB — CBC
HCT: 43.5 % (ref 36.0–46.0)
MCHC: 33.6 g/dL (ref 30.0–36.0)
MCV: 92.8 fL (ref 78.0–100.0)
Platelets: 212 10*3/uL (ref 150–400)
RDW: 14.2 % (ref 11.5–15.5)
WBC: 7.2 10*3/uL (ref 4.0–10.5)

## 2011-12-11 LAB — DIFFERENTIAL
Basophils Absolute: 0 10*3/uL (ref 0.0–0.1)
Basophils Relative: 0 % (ref 0–1)
Eosinophils Relative: 6 % — ABNORMAL HIGH (ref 0–5)
Monocytes Absolute: 0.7 10*3/uL (ref 0.1–1.0)

## 2011-12-11 LAB — COMPREHENSIVE METABOLIC PANEL
AST: 22 U/L (ref 0–37)
Albumin: 3.7 g/dL (ref 3.5–5.2)
BUN: 14 mg/dL (ref 6–23)
Calcium: 10.1 mg/dL (ref 8.4–10.5)
Creatinine, Ser: 0.75 mg/dL (ref 0.50–1.10)
Total Bilirubin: 0.2 mg/dL — ABNORMAL LOW (ref 0.3–1.2)
Total Protein: 6.7 g/dL (ref 6.0–8.3)

## 2011-12-11 LAB — LACTATE DEHYDROGENASE: LDH: 235 U/L (ref 94–250)

## 2011-12-11 MED ORDER — HEPARIN SOD (PORK) LOCK FLUSH 100 UNIT/ML IV SOLN
500.0000 [IU] | Freq: Once | INTRAVENOUS | Status: AC
  Administered 2011-12-11: 500 [IU] via INTRAVENOUS
  Filled 2011-12-11: qty 5

## 2011-12-11 MED ORDER — HEPARIN SOD (PORK) LOCK FLUSH 100 UNIT/ML IV SOLN
INTRAVENOUS | Status: AC
  Filled 2011-12-11: qty 5

## 2011-12-11 MED ORDER — SODIUM CHLORIDE 0.9 % IJ SOLN
INTRAMUSCULAR | Status: AC
  Filled 2011-12-11: qty 10

## 2011-12-11 MED ORDER — SODIUM CHLORIDE 0.9 % IJ SOLN
10.0000 mL | INTRAMUSCULAR | Status: DC | PRN
  Administered 2011-12-11: 10 mL via INTRAVENOUS
  Filled 2011-12-11: qty 10

## 2011-12-11 NOTE — Progress Notes (Signed)
Yanai M Kari presented for Portacath access and flush. Proper placement of portacath confirmed by CXR. Portacath located left chest wall accessed with  H 20 needle. Good blood return present. Portacath flushed with 20ml NS and 500U/5ml Heparin and needle removed intact. Procedure without incident. Patient tolerated procedure well.   

## 2011-12-11 NOTE — Progress Notes (Signed)
Problem #1 stage I AE versus stage II diffuse large B-cell lymphoma occurring predominantly in the nasal cavity on the right side with one lymph node on the right also apparently involved CD20 positive disease treated with 3 cycles of R.-CVP using modified doses for cycles 2 and 3 because of neutropenia weakness fatigue atrial fibrillation and hospitalization after cycle 1. She then received radiation therapy by Dr. Roselind Messier.  Problem #2 atrial fibrillation status post cardioversion and her cardiologist sees her on a regular basis.  Problem #3 multiple skin lesions 2 of which she states were said to be skin cancers removed by Dr. Sherril Croon but she is soon to see her primary dermatologist. She also still has a itchy rash on her right lower leg laterally for which she is using a medicated cream every day.  She is getting stronger eating better maintain her weight without B. symptoms. Her breathing through her nose is now normal and has been so for a while. She is walking better with or without her walker but she uses her walker for steadying herself. She takes small deliberate steps. She is alert and oriented. There are no lymph nodes palpable in the neck subclavicular cervical infraclavicular axillary or inguinal areas. She has no hepatosplenomegaly. Skin is warm and dry to touch. HEENT exam is unremarkable she does not have a nasal sounding voice. Her abdomen is soft and nontender without distention now sounds are normal her heart shows a very regular rhythm and rate without murmur rub or gallop. Her Port-A-Cath in the left upper chest wall is intact she has no peripheral edema. She has a few small papules on the right lower lateral portion of the leg these are nonspecific  I don't think we need to schedule her for a PET scan. I think we need to follow her every 3 months with history physical and laboratory work and only if things change then pursue a PET scan. Her only complaint is that of weakness still some  fatigue and difficulty swallowing certain foods such as meats and breads which are very dry but other dry food she has no issues with. Dr. Roselind Messier has told her it may take a couple more months for her saliva to return. I will see her in 3 months

## 2011-12-11 NOTE — Patient Instructions (Addendum)
Sierra Good  [id number] 04-10-1923 Dr. Glenford Peers   [facility]  Discharge Instructions  RECOMMENDATIONS MADE BY THE CONSULTANT AND ANY TEST RESULTS WILL BE SENT TO YOUR REFERRING DOCTOR.   EXAM FINDINGS BY MD TODAY AND SIGNS AND SYMPTOMS TO REPORT TO CLINIC OR PRIMARY MD: exam and discussion per MD.  Wants to leave port in for a while longer.  MEDICATIONS PRESCRIBED: none   INSTRUCTIONS GIVEN AND DISCUSSED: Other :  Report uncontrolled pain, shortness of breath or other problems.  SPECIAL INSTRUCTIONS/FOLLOW-UP: Return to Clinic :  See schedule.   I acknowledge that I have been informed and understand all the instructions given to me and received a copy. I do not have any more questions at this time, but understand that I may call the Specialty Clinic at [facility] at [phone] during business hours should I have any further questions or need assistance in obtaining follow-up care.    __________________________________________  _____________  __________ Signature of Patient or Authorized Representative            Date                   Time    __________________________________________ Nurse's Signature

## 2011-12-23 ENCOUNTER — Ambulatory Visit (HOSPITAL_COMMUNITY): Admission: RE | Admit: 2011-12-23 | Payer: Medicare Other | Source: Ambulatory Visit

## 2011-12-30 ENCOUNTER — Ambulatory Visit (HOSPITAL_COMMUNITY)
Admission: RE | Admit: 2011-12-30 | Discharge: 2011-12-30 | Disposition: A | Discharge status: Home / Self Care | Payer: Medicare Other | Source: Ambulatory Visit | Attending: Cardiovascular Disease | Admitting: Cardiovascular Disease

## 2011-12-30 DIAGNOSIS — I319 Disease of pericardium, unspecified: Secondary | ICD-10-CM | POA: Insufficient documentation

## 2011-12-30 DIAGNOSIS — R55 Syncope and collapse: Secondary | ICD-10-CM | POA: Insufficient documentation

## 2011-12-30 DIAGNOSIS — I4891 Unspecified atrial fibrillation: Secondary | ICD-10-CM | POA: Insufficient documentation

## 2011-12-30 DIAGNOSIS — R0989 Other specified symptoms and signs involving the circulatory and respiratory systems: Secondary | ICD-10-CM | POA: Insufficient documentation

## 2011-12-30 DIAGNOSIS — E785 Hyperlipidemia, unspecified: Secondary | ICD-10-CM | POA: Insufficient documentation

## 2011-12-30 DIAGNOSIS — I10 Essential (primary) hypertension: Secondary | ICD-10-CM | POA: Insufficient documentation

## 2011-12-30 DIAGNOSIS — R0609 Other forms of dyspnea: Secondary | ICD-10-CM | POA: Insufficient documentation

## 2011-12-30 NOTE — Progress Notes (Signed)
*  PRELIMINARY RESULTS* Echocardiogram 2D Echocardiogram has been performed.  Sierra Good 12/30/2011, 11:23 AM

## 2012-01-21 ENCOUNTER — Ambulatory Visit (INDEPENDENT_AMBULATORY_CARE_PROVIDER_SITE_OTHER): Payer: Medicare Other | Admitting: Otolaryngology

## 2012-01-21 DIAGNOSIS — C8581 Other specified types of non-Hodgkin lymphoma, lymph nodes of head, face, and neck: Secondary | ICD-10-CM

## 2012-01-22 ENCOUNTER — Encounter (HOSPITAL_COMMUNITY): Payer: Medicare Other | Attending: Oncology

## 2012-01-22 DIAGNOSIS — C8591 Non-Hodgkin lymphoma, unspecified, lymph nodes of head, face, and neck: Secondary | ICD-10-CM

## 2012-01-22 DIAGNOSIS — C8581 Other specified types of non-Hodgkin lymphoma, lymph nodes of head, face, and neck: Secondary | ICD-10-CM

## 2012-01-22 MED ORDER — SODIUM CHLORIDE 0.9 % IJ SOLN
INTRAMUSCULAR | Status: AC
  Filled 2012-01-22: qty 10

## 2012-01-22 MED ORDER — HEPARIN SOD (PORK) LOCK FLUSH 100 UNIT/ML IV SOLN
INTRAVENOUS | Status: AC
  Filled 2012-01-22: qty 5

## 2012-01-22 MED ORDER — SODIUM CHLORIDE 0.9 % IJ SOLN
10.0000 mL | INTRAMUSCULAR | Status: DC | PRN
  Administered 2012-01-22: 10 mL via INTRAVENOUS
  Filled 2012-01-22: qty 10

## 2012-01-22 MED ORDER — HEPARIN SOD (PORK) LOCK FLUSH 100 UNIT/ML IV SOLN
500.0000 [IU] | Freq: Once | INTRAVENOUS | Status: AC
  Administered 2012-01-22: 500 [IU] via INTRAVENOUS
  Filled 2012-01-22: qty 5

## 2012-01-22 NOTE — Progress Notes (Signed)
Tolerated port flush well.  Good blood return. 

## 2012-01-29 ENCOUNTER — Encounter (HOSPITAL_COMMUNITY): Payer: Self-pay

## 2012-03-04 ENCOUNTER — Encounter (HOSPITAL_COMMUNITY): Payer: Medicare Other | Attending: Oncology

## 2012-03-04 DIAGNOSIS — C8591 Non-Hodgkin lymphoma, unspecified, lymph nodes of head, face, and neck: Secondary | ICD-10-CM

## 2012-03-04 DIAGNOSIS — C8581 Other specified types of non-Hodgkin lymphoma, lymph nodes of head, face, and neck: Secondary | ICD-10-CM | POA: Insufficient documentation

## 2012-03-04 LAB — COMPREHENSIVE METABOLIC PANEL
Alkaline Phosphatase: 92 U/L (ref 39–117)
BUN: 15 mg/dL (ref 6–23)
CO2: 27 mEq/L (ref 19–32)
Chloride: 101 mEq/L (ref 96–112)
Creatinine, Ser: 0.76 mg/dL (ref 0.50–1.10)
GFR calc Af Amer: 85 mL/min — ABNORMAL LOW (ref >90)
GFR calc non Af Amer: 74 mL/min — ABNORMAL LOW (ref >90)
Glucose, Bld: 129 mg/dL — ABNORMAL HIGH (ref 70–99)
Total Bilirubin: 0.3 mg/dL (ref 0.3–1.2)

## 2012-03-04 LAB — CBC
HCT: 43.5 % (ref 36.0–46.0)
Hemoglobin: 14.7 g/dL (ref 12.0–15.0)
MCV: 93.5 fL (ref 78.0–100.0)
RDW: 14.7 % (ref 11.5–15.5)
WBC: 7.2 10*3/uL (ref 4.0–10.5)

## 2012-03-04 LAB — DIFFERENTIAL
Basophils Absolute: 0 10*3/uL (ref 0.0–0.1)
Eosinophils Relative: 2 % (ref 0–5)
Lymphocytes Relative: 13 % (ref 12–46)
Lymphs Abs: 1 10*3/uL (ref 0.7–4.0)
Monocytes Absolute: 0.4 10*3/uL (ref 0.1–1.0)
Monocytes Relative: 6 % (ref 3–12)

## 2012-03-04 MED ORDER — SODIUM CHLORIDE 0.9 % IJ SOLN
10.0000 mL | INTRAMUSCULAR | Status: AC | PRN
  Filled 2012-03-04: qty 10

## 2012-03-04 MED ORDER — HEPARIN SOD (PORK) LOCK FLUSH 100 UNIT/ML IV SOLN
INTRAVENOUS | Status: AC
  Filled 2012-03-04: qty 5

## 2012-03-04 MED ORDER — SODIUM CHLORIDE 0.9 % IJ SOLN
INTRAMUSCULAR | Status: AC
  Filled 2012-03-04: qty 10

## 2012-03-04 MED ORDER — HEPARIN SOD (PORK) LOCK FLUSH 100 UNIT/ML IV SOLN
500.0000 [IU] | Freq: Once | INTRAVENOUS | Status: AC
  Filled 2012-03-04: qty 5

## 2012-03-04 NOTE — Progress Notes (Unsigned)
Nialah M Harbin presented for Portacath access and flush. Proper placement of portacath confirmed by CXR. Portacath located lt chest wall accessed with  H 20 needle. Good blood return present. Portacath flushed with 20ml NS and 500U/5ml Heparin and needle removed intact. Procedure without incident. Patient tolerated procedure well.   

## 2012-03-05 ENCOUNTER — Telehealth (HOSPITAL_COMMUNITY): Payer: Self-pay | Admitting: *Deleted

## 2012-03-05 NOTE — Telephone Encounter (Signed)
Message copied by Oda Kilts on Sat Mar 05, 2012  9:45 AM ------      Message from: Ellouise Newer III      Created: Fri Mar 04, 2012  5:00 PM       Labs are great

## 2012-03-05 NOTE — Telephone Encounter (Signed)
Pt notified that her labs were great! She said thanks for calling.

## 2012-03-21 LAB — COMPREHENSIVE METABOLIC PANEL
AST: 17 U/L
Albumin: 4.5
Alkaline Phosphatase: 100 U/L
BUN: 14 mg/dL (ref 4–21)
Globulin, Total: 2.1
Glucose: 83
Total Bilirubin: 0.3 mg/dL
Total Protein: 6.6 g/dL

## 2012-03-21 LAB — CBC WITH DIFFERENTIAL/PLATELET
Hemoglobin: 15.6 g/dL (ref 12.0–16.0)
WBC: 7.5
platelet count: 247

## 2012-03-22 ENCOUNTER — Ambulatory Visit (HOSPITAL_COMMUNITY): Payer: Self-pay | Admitting: Oncology

## 2012-03-25 ENCOUNTER — Encounter (HOSPITAL_COMMUNITY): Payer: Medicare Other | Attending: Oncology | Admitting: Oncology

## 2012-03-25 VITALS — BP 137/71 | HR 57 | Temp 97.6°F | Resp 20 | Wt 138.4 lb

## 2012-03-25 DIAGNOSIS — C8591 Non-Hodgkin lymphoma, unspecified, lymph nodes of head, face, and neck: Secondary | ICD-10-CM

## 2012-03-25 DIAGNOSIS — C8581 Other specified types of non-Hodgkin lymphoma, lymph nodes of head, face, and neck: Secondary | ICD-10-CM | POA: Insufficient documentation

## 2012-03-25 NOTE — Patient Instructions (Addendum)
[facility]  Discharge Instructions  RECOMMENDATIONS MADE BY THE CONSULTANT AND ANY TEST RESULTS WILL BE SENT TO YOUR REFERRING DOCTOR.   EXAM FINDINGS BY MD TODAY AND SIGNS AND SYMPTOMS TO REPORT TO CLINIC OR PRIMARY MD: Exam and discussion by Dellis Anes, PA.  Your labs are great.  The only thing that is elevated is your blood sugar at 123.    MEDICATIONS PRESCRIBED: none   INSTRUCTIONS GIVEN AND DISCUSSED: Other :  Report any new lumps, night sweats, recurring infections or other problems.  SPECIAL INSTRUCTIONS/FOLLOW-UP: Lab work Needed every 2 and Return to Clinic in 3 months to see MD.   I acknowledge that I have been informed and understand all the instructions given to me and received a copy. I do not have any more questions at this time, but understand that I may call the Specialty Clinic at [facility] at [phone] during business hours should I have any further questions or need assistance in obtaining follow-up care.    __________________________________________  _____________  __________ Signature of Patient or Authorized Representative            Date                   Time    __________________________________________ Nurse's Signature

## 2012-03-25 NOTE — Progress Notes (Signed)
Sierra Good [address] [place] [address]  1. Lymphoma of lymph nodes of head, face, and/or neck     CURRENT THERAPY: Observation  INTERVAL HISTORY: Sierra Good 78 y.o. 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 She then received radiation therapy by Dr. Roselind Messier.   Last week she developed GERD last week consisting of belching followed by vomiting.   She was seen by Dr. Sherril Croon and he switched [place] to Protonix 40 mg daily.  She began this on 03/21/2012.   She does report some nausea, but no vomiting since starting Protonix.  She went to [facility] for [place] of gallbladder to evaluate for gallstones.  It was negative.   She reports decreased olfaction since her diagnosis and treatment.  She reports an instance where she could not smell the spray of a skunk in her yard, but everyone else could.  She does relay a story when she could smell onions however.    Otherwise, she denies any B symptoms.  Hematologically, she denies any complaints. She is seen in a wheelchair today.  She has lost 10 lbs in 6 months and we will have to continue to monitor this closely.  She reports a decreased appetite, but this has not changed or worsened.  I personally reviewed and went over laboratory results with the patient.  Her lab work is truly unremarkable most recently.   Past Medical History  Diagnosis Date  . GERD (gastroesophageal reflux disease)     takes omeprazole daily  . Hyperlipidemia   . Hypertension   . Osteoarthritis of lumbar spine   . PONV (postoperative nausea and vomiting) 2009      after total hip  . Deviated nasal septum     pt had septoplasty  . Nasal turbinate hypertrophy     bilateral  . Cancer     nasal cancer  . Cancer     cancer in the nose and in lower jaw  . Atrial fibrillation   . Dizziness   . Itching     has HYPERLIPIDEMIA; HYPERTENSION; Atrial fibrillation; BRADYCARDIA; SYNCOPE, HX OF; Lymphoma of lymph nodes of head, face, and/or neck; Nausea alone; and SOB (shortness of breath) on exertion on her problem list.     is allergic to statins.  Sierra Good had no medications administered during this visit.  Past Surgical History  Procedure Date  . Total hip arthroplasty 2009    Right, [facility] (general)  . Bunionectomy 20+yrs ago    bilateral, [facility]  . Hammer toe surgery     20+ yrs ago  . Cataract extraction w/ intraocular lens  implant, bilateral 2010    [facility]  . Nasal septoplasty w/ turbinoplasty 12/18/2010    Procedure: NASAL SEPTOPLASTY WITH TURBINATE REDUCTION;  Surgeon: Darletta Moll;  Location: [facility];  Service: ENT;  Laterality: Bilateral;  . Colon surgery 2010    hemicoloctomy-flat polyp removed at [facility]  . Portacath placement 02/10/2011    Procedure: INSERTION PORT-A-CATH;  Surgeon: Marlane Hatcher;  Location: [facility];  Service: General;  Laterality: Left;  left subclavian  . Skin cancer removal 6/13  Denies any headaches, dizziness, double vision, fevers, chills, night sweats, nausea, vomiting, diarrhea, constipation, chest pain, heart palpitations, shortness of breath, blood in stool, black tarry stool, urinary pain, urinary burning, urinary frequency, hematuria.   PHYSICAL EXAMINATION  ECOG PERFORMANCE STATUS: 3 - Symptomatic, >50% confined to bed  Filed Vitals:   03/25/12 1200  BP: 137/71  Pulse: 57  Temp: 97.6 F (36.4 C)  Resp: 20    GENERAL:alert, no distress, well nourished, well developed, comfortable, cooperative, smiling and in a wheelchair SKIN: skin color, texture, turgor are normal, no rashes or significant  lesions HEAD: Normocephalic, No masses, lesions, tenderness or abnormalities EYES: normal, Conjunctiva are pink and non-injected EARS: External ears normal OROPHARYNX:mucous membranes are moist  NECK: supple, no adenopathy, thyroid normal size, non-tender, without nodularity, no stridor, non-tender, trachea midline LYMPH:  no palpable lymphadenopathy, difficult to assess for hepatosplenomegaly while sitting in wheelchair. BREAST:breasts appear normal, no suspicious masses, no skin or nipple changes or axillary nodes LUNGS: clear to auscultation and percussion HEART: regular rate & rhythm, no murmurs, no gallops, S1 normal and S2 normal ABDOMEN:abdomen soft, non-tender and normal bowel sounds BACK: Back symmetric, no curvature. EXTREMITIES:less then 2 second capillary refill, no joint deformities, effusion, or inflammation, no edema, no skin discoloration, no clubbing, no cyanosis  NEURO: alert & oriented x 3 with fluent speech, no focal motor/sensory deficits, in a wheelchair   LABORATORY DATA: CBC    Component Value Date/Time   WBC 7.2 03/04/2012 1233   RBC 4.65 03/04/2012 1233   HGB 14.7 03/04/2012 1233   HCT 43.5 03/04/2012 1233   PLT 224 03/04/2012 1233   MCV 93.5 03/04/2012 1233   MCH 31.6 03/04/2012 1233   MCHC 33.8 03/04/2012 1233   RDW 14.7 03/04/2012 1233   LYMPHSABS 1.0 03/04/2012 1233   MONOABS 0.4 03/04/2012 1233   EOSABS 0.1 03/04/2012 1233   BASOSABS 0.0 03/04/2012 1233      Chemistry      Component Value Date/Time   NA 135 03/04/2012 1233   K 4.1 03/04/2012 1233   CL 101 03/04/2012 1233   CO2 27 03/04/2012 1233   BUN 15 03/04/2012 1233   CREATININE 0.76 03/04/2012 1233      Component Value Date/Time   CALCIUM 9.9 03/04/2012 1233   ALKPHOS 92 03/04/2012 1233   AST 18 03/04/2012 1233   ALT 11 03/04/2012 1233   BILITOT 0.3 03/04/2012 1233      Results for Sierra Good (MRN [id number]) as of 03/25/2012 12:33  Ref. Range 03/04/2012 12:33  [facility] Latest Range: 94-250 U/L 183       ASSESSMENT:  1. 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 She then received radiation therapy by Dr. Roselind Messier.  2. Atrial fibrillation status post cardioversion and her cardiologist sees her on a regular basis.  3. Multiple skin lesions 2 of which she states were said to be skin cancers removed by Dr. Sherril Croon but she is soon to see her primary dermatologist. She also still has a itchy rash on her right lower leg laterally for which she is using a medicated cream every day.   PLAN:  1. I personally reviewed and went over laboratory results  with the patient. 2. No PET scan scheduled.  Will only order one if her condition changes or she has symptoms warranting further work-up/evaluation. 3. Labs every 2 months: CBC diff, CMET, LDH.  Ordered as standing orders. 4. Return in 3 months for follow-up  All questions were answered. The patient knows to call the clinic with any problems, questions or concerns. We can certainly see the patient much sooner if necessary.  The patient and plan discussed with Glenford Peers, Good and he is in agreement with the aforementioned.  KEFALAS,THOMAS

## 2012-04-15 ENCOUNTER — Encounter (HOSPITAL_COMMUNITY): Payer: Medicare Other | Attending: Oncology

## 2012-04-15 DIAGNOSIS — Z95828 Presence of other vascular implants and grafts: Secondary | ICD-10-CM

## 2012-04-15 DIAGNOSIS — C8581 Other specified types of non-Hodgkin lymphoma, lymph nodes of head, face, and neck: Secondary | ICD-10-CM

## 2012-04-15 DIAGNOSIS — Z452 Encounter for adjustment and management of vascular access device: Secondary | ICD-10-CM

## 2012-04-15 DIAGNOSIS — Z9889 Other specified postprocedural states: Secondary | ICD-10-CM | POA: Insufficient documentation

## 2012-04-15 MED ORDER — HEPARIN SOD (PORK) LOCK FLUSH 100 UNIT/ML IV SOLN
500.0000 [IU] | Freq: Once | INTRAVENOUS | Status: AC
  Administered 2012-04-15: 500 [IU] via INTRAVENOUS
  Filled 2012-04-15: qty 5

## 2012-04-15 MED ORDER — HEPARIN SOD (PORK) LOCK FLUSH 100 UNIT/ML IV SOLN
INTRAVENOUS | Status: AC
  Filled 2012-04-15: qty 5

## 2012-04-15 MED ORDER — SODIUM CHLORIDE 0.9 % IJ SOLN
10.0000 mL | INTRAMUSCULAR | Status: DC | PRN
  Administered 2012-04-15: 10 mL via INTRAVENOUS
  Filled 2012-04-15: qty 10

## 2012-04-15 NOTE — Progress Notes (Signed)
Sierra Good presented for Portacath access and flush. Proper placement of portacath confirmed by CXR. Portacath located lt chest wall accessed with  H 20 needle. Good blood return present. Portacath flushed with 20ml NS and 500U/5ml Heparin and needle removed intact. Procedure without incident. Patient tolerated procedure well.   

## 2012-04-29 ENCOUNTER — Other Ambulatory Visit (HOSPITAL_COMMUNITY): Payer: Self-pay

## 2012-05-10 ENCOUNTER — Encounter: Payer: Self-pay | Admitting: Internal Medicine

## 2012-05-11 ENCOUNTER — Ambulatory Visit (INDEPENDENT_AMBULATORY_CARE_PROVIDER_SITE_OTHER): Payer: Medicare Other | Admitting: Urgent Care

## 2012-05-11 ENCOUNTER — Encounter: Payer: Self-pay | Admitting: Urgent Care

## 2012-05-11 VITALS — BP 139/67 | HR 51 | Temp 97.4°F | Ht 66.0 in | Wt 136.4 lb

## 2012-05-11 DIAGNOSIS — K219 Gastro-esophageal reflux disease without esophagitis: Secondary | ICD-10-CM

## 2012-05-11 DIAGNOSIS — R1310 Dysphagia, unspecified: Secondary | ICD-10-CM

## 2012-05-11 NOTE — Patient Instructions (Addendum)
We will call you to set up EGD (upper endoscopy) & possible dilation (stretching) of your esophagus with instructions on your COUMADIN Continue Dexilant 60mg  daily for acid reflux We have requested your xrays & ultrasound from [facility] for review Dysphagia 2 diet until EGD DDysphagia Diet Level 2, Mechanically Altered This dysphagia mechanically altered diet is restricted to:  Foods that are moist, soft-textured, and easy to chew and swallow.  Meats that are ground or minced to no larger than -inch pieces. Meats are moist with gravy or sauce added.  Foods that do not include bread or bread-like textures except soft pancakes, well-moistened with syrup or sauce.  Textures with some chewing ability required.  Casseroles without rice.  Cooked vegetables that are less than -inch in size and easily mashed with a fork. No cooked corn, peas, broccoli, cauliflower, cabbage, Brussels sprouts, asparagus, or other fibrous, non-tender, or rubbery cooked vegetables.  Canned fruit except for pineapple. Fruit must be cut into no larger than -inch pieces.  Foods that do not include nuts, seeds, coconut, or sticky textures. FOOD TEXTURES Includes all foods listed on Dysphagia Diet Level 1, Pureed, in addition to the foods listed below. Beverages  Recommended: All beverages thickened to recommended consistency with minimal amounts of texture pulp. Any texture should be suspended in the liquid and should not fall out.  Avoid: All others.  You are currently limited to one of the following liquid consistency levels:  Thin.  Nectar-like.  Honey-like.  Spoon-thick. Breads  Recommended: Soft pancakes, well-moistened with syrup or sauce.  Avoid: All others. Cereals  Recommended: Cooked cereals with little texture, including oatmeal. Unprocessed wheat bran stirred into cereals for bulk. If thin liquids are restricted, it is important that all of the liquid is absorbed into the  cereal.  Avoid: All dry cereals and any cooked cereals that may contain flax seeds or other seeds or nuts. Whole-grain, dry, or coarse cereals. Cereals with nuts, seeds, dried fruit, or coconut. Desserts  Recommended: Pudding, custard. Soft fruit pies with bottom crust only. Canned fruit (excluding pineapple). Soft, moist cakes with icing.  Avoid: Dry, coarse cakes and cookies. Anything with nuts, seeds, coconut, pineapple, or dried fruit. Breakfast yogurt with nuts. Rice or bread pudding.  These foods are considered thin liquids and should be avoided if thin liquids are restricted:  Frozen malts, milk shakes, frozen yogurt, eggnog, nutritional supplements, ice cream, sherbet, regular or sugar-free gelatin, or any foods that become thin liquid at either room temperature, 70 F (21.1 C) or body temperature, 98 F (36.7 C). Fats  Recommended: Butter, margarine, cream for cereal (depending on liquid consistency recommendations), gravy, cream sauces, sour cream, sour cream dips with soft additives, mayonnaise, salad dressings, cream cheese, cream cheese spreads with soft additives, whipped toppings.  Avoid: All fats with coarse or chunky additives. Fruits  Recommended: Soft drained, canned, or cooked fruits without seeds or skin. Fresh soft and ripe banana. Fruit juices with a small amount of pulp. If thin liquids are restricted, fruit juices should be thickened to appropriate consistency.  Avoid: Fresh or frozen fruits. Cooked fruit with skin or seeds. Dried fruits. Fresh, canned, or cooked pineapple. Meats and Meat Substitutes Meat pieces should not exceed -inch cubes and should be tender.  Recommended: Moistened ground or cooked meat, poultry, or fish. Moist ground or tender meat may be served with gravy or sauce. Casseroles without rice. Moist macaroni and cheese, well-cooked pasta with meat sauce, tuna noodle casserole, soft, moist lasagna. Moist meatballs,  meatloaf, or fish loaf.  Protein salads, such as tuna or egg without large chunks, celery, or onion. Cottage cheese, smooth quiche without large chunks. Poached, scrambled, or soft-cooked eggs (egg yolks should not be "runny" but should be moist and able to be mashed with butter, margarine, or other moisture added to them). Cook eggs to 160 F (71.1 C) or use pasteurized eggs for safety. Souffls may have small, soft chunks. Tofu. Well-cooked, slightly mashed, moist legumes such as baked beans. All meats or protein substitutes should be served with sauces or moistened to help maintain cohesiveness in the mouth.  Avoid: Dry meats and tough meats, such as bacon, sausage, hot dogs, and bratwurst. Dry casseroles or casseroles with rice or large chunks. Peanut butter. Cheese slices and cubes. Hard-cooked or crisp fried eggs. Sandwiches. Pizza. Potatoes and Starches  Recommended: Well-cooked, moistened, boiled, baked, or mashed potatoes. Well-cooked shredded hash brown potatoes that are not crisp. All potatoes need to be moist and in sauces. Well-cooked noodles in sauce. Spaetzel or soft dumplings that have been moistened with butter or gravy.  Avoid: Potato skins and chips. Fried or French-fried potatoes. Rice. Soups  Recommended: Soups with easy-to-chew or easy-to-swallow meats or vegetables. Contents in soups should be less than -inch pieces. Soups will need to be thickened to appropriate consistency if soup is thinner than prescribed liquid consistency.  Avoid: Soups with large chunks of meat and vegetables. Soups with rice, corn, peas. Vegetables  Recommended: All soft, well-cooked vegetables. Vegetables should be less than -inch pieces. They should be easily mashed with a fork.  Avoid: Cooked corn and peas. Broccoli, cabbage, Brussels sprouts, asparagus, or other fibrous, non-tender, or rubbery cooked vegetables. Miscellaneous  Recommended: Jams and preserves without seeds, jelly. Sauces or salsas with small, tender,  less than -inch pieces. Soft, smooth chocolate bars that are easily chewed.  Avoid: Seeds, nuts, coconut, or sticky foods. Chewy candies such as caramels or licorice. Document Released: 05/25/2005 Document Revised: 08/17/2011 Document Reviewed: 06/17/2009 [facility] Patient Information 2013 [place], [place].

## 2012-05-11 NOTE — Progress Notes (Addendum)
Primary Care Physician:  [name], MD Primary Gastroenterologist:  Dr. Jena Gauss  Chief Complaint  Patient presents with  . Gastrophageal Reflux    HPI:  Sierra Good is a 78 y.o. female here refractory GERD. Sierra Good complains of "belching all the time".  She has had intermittent nausea and vomiting. She tells me she vomited just the other night.  This has been going on for a couple months.  Gallbladder work-up as below negative at [facility].  She has history of hiatal hernia & feels that this may be the culprit of her problems.   C/o chest pain, heartburn & indigestion daily.  50% better with PPI.  She has tried Dexilant 60mg  daily, but it was too expensive.    Previously, she was on protonix 40mg  daily.  She was recently started on Carafate 1 g QID and this helped some.  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  She is having dysphagia with certain foods like bread, peanut butter, & crackers.  No dysphagia with liquids.  Denies odynophagia. Weight loss reported 15# in past year.    She is on coumadin followed by Dr Gwenlyn Saran for atrial fibrillation.  Last colonoscopy was by Dr. Jena Gauss 01/06/10 and was normal.    03/23/12 abdominal ultrasound [facility]: Poor visualization of gallbladder, incomplete distention, caudate lobe hepatic cyst, bilateral increased cortical echogenicity suggestive chronic medical renal disease HIDA scan 04/04/12 [facility]: Normal biliary imaging/gallbladder function  Labs 10/15: CBC normal except neutrophils 76.  Ca 10.4.  Otherwise normal CMP.  Amylase 34, lipase 44.  Past Medical History  Diagnosis Date  . GERD (gastroesophageal reflux disease)     takes omeprazole daily  . Hyperlipidemia   . Hypertension   . Osteoarthritis of lumbar spine   . PONV (postoperative nausea and vomiting) 2009    after total hip  . Deviated nasal septum     pt had septoplasty  . Nasal turbinate  hypertrophy     bilateral  . Cancer     nasal cancer  . Cancer     cancer in the nose and in lower jaw  . Atrial fibrillation   . Dizziness   . Itching   . Diffuse large B cell lymphoma     Dr. Mariel Sleet -Nasal cavity, right sided lymph adenopathy  . OAB (overactive bladder)   . Tubulovillous adenoma of colon 10/2008    w/ HG dysplasia    Past Surgical History  Procedure Date  . Total hip arthroplasty 2009    Right, [facility] (general)  . Bunionectomy 20+yrs ago    bilateral, [facility]  . Hammer toe surgery     20+ yrs ago  . Cataract extraction w/ intraocular lens  implant, bilateral 2010    [facility]  . Nasal septoplasty w/ turbinoplasty 12/18/2010    Procedure: NASAL SEPTOPLASTY WITH TURBINATE REDUCTION;  Surgeon: Darletta Moll;  Location: [facility]S;  Service: ENT;  Laterality: Bilateral;  . Colon surgery 10/29/2008    Ingram-right hemicolectomy for tubulovillous adenoma (3cm) with HG glandular dysplasia  . Portacath placement 02/10/2011    Procedure: INSERTION PORT-A-CATH;  Surgeon: Marlane Hatcher;  Location: [facility]S;  Service: General;  Laterality: Left;  left subclavian  . Skin cancer removal 6/13  . Colonoscopy 09/28/2002    RMR: Normal colonic mucosa.  Redundant and elongated but otherwise normal appearing colon/normal rectum  . Colonoscopy 09/11/2008  RMR: Normal rectum/Diminutive sigmoid polyp status post snare removal/  Flat sessile semilunar lesion at and in the ileocecal valve status post hot snare piecemeal debulking as described above  . Colonoscopy 01/06/10    Rourk-normal rectum, normal residual colon status post right hemicolectomy    Current Outpatient Prescriptions  Medication Sig Dispense Refill  . amLODipine (NORVASC) 5 MG tablet Take 2.5 mg by mouth daily.       . Calcium-Vitamin D (CALTRATE 600 PLUS-VIT D PO) Take 1 tablet by mouth daily.      Marland Kitchen CARAFATE 1 GM/10ML suspension Take 1 g by mouth 2 (two) times daily.       . clobetasol cream (TEMOVATE) 0.05 % Apply 1  application topically 2 (two) times daily.       Marland Kitchen DEXILANT 60 MG capsule Take 60 mg by mouth daily.       . Multiple Vitamins-Minerals (CENTRUM SILVER ULTRA WOMENS) TABS Take 1 tablet by mouth daily.        Marland Kitchen oxybutynin (DITROPAN) 5 MG tablet Take 5 mg by mouth 2 (two) times daily.      Marland Kitchen warfarin (COUMADIN) 4 MG tablet Take 4 mg by mouth daily.      Marland Kitchen FLULAVAL injection       . pantoprazole (PROTONIX) 40 MG tablet Take 40 mg by mouth daily.       . polyethylene glycol powder (GLYCOLAX/MIRALAX) powder Take 17 g by mouth as needed.      . senna (SENOKOT) 8.6 MG tablet Take 1 tablet by mouth as needed.       No current facility-administered medications for this visit.   Facility-Administered Medications Ordered in Other Visits  Medication Dose Route Frequency Provider Last Rate Last Dose  . heparin lock flush 100 unit/mL  500 Units Intravenous Once Ellouise Newer, PA      . sodium chloride 0.9 % injection 10 mL  10 mL Intravenous PRN Ellouise Newer, PA        Allergies as of 05/11/2012 - Review Complete 05/11/2012  Allergen Reaction Noted  . Statins Other (See Comments) 12/12/2010    Family History:There is no known family history of colorectal carcinoma , liver disease, or inflammatory bowel disease.  Problem Relation Age of Onset  . Pseudochol deficiency Neg Hx   . Malignant hyperthermia Neg Hx   . Hypotension Neg Hx   . Anesthesia problems Neg Hx   . Cancer Mother   . Stroke Father     History   Social History  . Marital Status: Widowed    Spouse Name: N/A    Number of Children: N/A  . Years of Education: N/A   Occupational History  . Not on file.   Social History Main Topics  . Smoking status: Never Smoker   . Smokeless tobacco: Never Used  . Alcohol Use: No  . Drug Use: No  . Sexually Active: Yes    Birth Control/ Protection: Post-menopausal   Review of Systems: Gen: See history of present illness CV: Denies chest pain, angina, palpitations, syncope,  orthopnea, PND, peripheral edema, and claudication. Resp: Denies dyspnea at rest, dyspnea with exercise, cough, sputum, wheezing, coughing up blood, and pleurisy. GI: Denies vomiting blood, jaundice, and fecal incontinence. GU : Denies urinary burning, blood in urine, urinary frequency, urinary hesitancy, nocturnal urination, and urinary incontinence. MS: Denies joint pain, limitation of movement, and swelling, stiffness, low back pain, extremity pain. Denies muscle weakness, cramps, atrophy.  Derm: Denies rash, itching, dry  skin, hives, moles, warts, or unhealing ulcers.  Psych: Denies depression, anxiety, memory loss, suicidal ideation, hallucinations, paranoia, and confusion. Heme: Denies bruising, bleeding, and enlarged lymph nodes. Neuro:  Denies any headaches, dizziness, paresthesias. Endo:  Denies any problems with DM, thyroid, adrenal function.  Physical Exam: BP 139/67  Pulse 51  Temp 97.4 F (36.3 C) (Temporal)  Ht 5\' 6"  (1.676 m)  Wt 136 lb 6.4 oz (61.871 kg)  BMI 22.02 kg/m2 No LMP recorded. Patient is postmenopausal. General:   Alert,  Well-developed, thin, pleasant and cooperative in NAD. Head:  Normocephalic and atraumatic. Eyes:  Sclera clear, no icterus.   Conjunctiva pink. Ears:  Normal auditory acuity. Nose:  No deformity, discharge, or lesions. Mouth:  No deformity or lesions,oropharynx pink & moist. Neck:  Supple; no masses or thyromegaly. Lungs:  Clear throughout to auscultation.   No wheezes, crackles, or rhonchi. No acute distress. Heart:  Regular rate and rhythm; no murmurs, clicks, rubs,  or gallops. Abdomen:  Normal bowel sounds.  No bruits.  Soft, non-tender and non-distended without masses, hepatosplenomegaly or hernias noted.  No guarding or rebound tenderness.   Rectal:  Deferred. Msk:  Symmetrical without gross deformities. Normal posture. Pulses:  Normal pulses noted. Extremities:  No  edema. Neurologic:  Alert and  oriented x4;  grossly normal  neurologically. Skin:  Intact without significant lesions or rashes. Lymph Nodes:  No significant cervical adenopathy. Psych:  Alert and cooperative. Normal mood and affect.

## 2012-05-12 ENCOUNTER — Encounter: Payer: Self-pay | Admitting: Urgent Care

## 2012-05-12 NOTE — Progress Notes (Signed)
Faxed to PCP

## 2012-05-12 NOTE — Assessment & Plan Note (Addendum)
[name] is a pleasant 78 y.o. female with refractory GERD despite PPI. She is also having solid food dysphagia. EGD with possible esophageal dilation with Dr. Jena Gauss for further evaluation to look for erosive reflux esophagitis, radiation induced esophagitis, gastritis, esophageal stricture or, web or ring.  I have discussed risks & benefits which include, but are not limited to, bleeding, infection, perforation & drug reaction.  The patient agrees with this plan & written consent will be obtained.    Will discuss management of coumadin with [name] for procedure Continue Dexilant 60mg  daily for acid reflux Dysphagia 2 diet until EGD

## 2012-05-16 NOTE — Progress Notes (Signed)
Quick Note:  Reviewed at OV ______ 

## 2012-05-16 NOTE — Progress Notes (Signed)
Discussed w/ [name].  Please let pt know. He would like pt to hold coumadin 4 days prior to EGD/ED if Dr. Royann Shivers is in agreement. Please send letter to cardiologist to ask him. Thanks

## 2012-05-17 NOTE — Progress Notes (Signed)
Sent request to Dr. Royann Shivers at [facility]

## 2012-05-18 ENCOUNTER — Encounter (HOSPITAL_COMMUNITY): Payer: Self-pay | Admitting: Pharmacy Technician

## 2012-05-18 ENCOUNTER — Other Ambulatory Visit: Payer: Self-pay | Admitting: Internal Medicine

## 2012-05-18 DIAGNOSIS — R112 Nausea with vomiting, unspecified: Secondary | ICD-10-CM

## 2012-05-18 DIAGNOSIS — K219 Gastro-esophageal reflux disease without esophagitis: Secondary | ICD-10-CM

## 2012-05-18 NOTE — Progress Notes (Signed)
Patient is scheduled for EGD/ED on Monday 12/16 and I have mailed instructions plus talked to Mrs. Edgin

## 2012-05-18 NOTE — Progress Notes (Signed)
Received fax from Dr. Royann Shivers, ok to hold coumadin x 4 days.

## 2012-05-19 MED ORDER — DROPERIDOL 2.5 MG/ML IJ SOLN
INTRAMUSCULAR | Status: AC
  Filled 2012-05-19: qty 2

## 2012-05-19 MED ORDER — PROPOFOL 10 MG/ML IV EMUL
INTRAVENOUS | Status: AC
  Filled 2012-05-19: qty 20

## 2012-05-23 ENCOUNTER — Encounter (HOSPITAL_COMMUNITY): Payer: Self-pay | Admitting: *Deleted

## 2012-05-23 ENCOUNTER — Ambulatory Visit (HOSPITAL_COMMUNITY)
Admission: RE | Admit: 2012-05-23 | Discharge: 2012-05-23 | Disposition: A | Discharge status: Home / Self Care | Payer: Medicare Other | Source: Ambulatory Visit | Attending: Internal Medicine | Admitting: Internal Medicine

## 2012-05-23 ENCOUNTER — Encounter (HOSPITAL_COMMUNITY)
Admission: RE | Disposition: A | Discharge status: Home / Self Care | Payer: Self-pay | Source: Ambulatory Visit | Attending: Internal Medicine

## 2012-05-23 ENCOUNTER — Telehealth: Payer: Self-pay

## 2012-05-23 DIAGNOSIS — K222 Esophageal obstruction: Secondary | ICD-10-CM | POA: Insufficient documentation

## 2012-05-23 DIAGNOSIS — R112 Nausea with vomiting, unspecified: Secondary | ICD-10-CM

## 2012-05-23 DIAGNOSIS — R1310 Dysphagia, unspecified: Secondary | ICD-10-CM

## 2012-05-23 DIAGNOSIS — K449 Diaphragmatic hernia without obstruction or gangrene: Secondary | ICD-10-CM | POA: Insufficient documentation

## 2012-05-23 DIAGNOSIS — K219 Gastro-esophageal reflux disease without esophagitis: Secondary | ICD-10-CM

## 2012-05-23 HISTORY — PX: ESOPHAGOGASTRODUODENOSCOPY (EGD) WITH ESOPHAGEAL DILATION: SHX5812

## 2012-05-23 SURGERY — ESOPHAGOGASTRODUODENOSCOPY (EGD) WITH ESOPHAGEAL DILATION
Anesthesia: Moderate Sedation

## 2012-05-23 MED ORDER — ONDANSETRON HCL 4 MG/2ML IJ SOLN
INTRAMUSCULAR | Status: AC
  Filled 2012-05-23: qty 2

## 2012-05-23 MED ORDER — BUTAMBEN-TETRACAINE-BENZOCAINE 2-2-14 % EX AERO
INHALATION_SPRAY | CUTANEOUS | Status: DC | PRN
  Administered 2012-05-23: 2 via TOPICAL

## 2012-05-23 MED ORDER — MEPERIDINE HCL 100 MG/ML IJ SOLN
INTRAMUSCULAR | Status: AC
  Filled 2012-05-23: qty 1

## 2012-05-23 MED ORDER — SODIUM CHLORIDE 0.45 % IV SOLN
INTRAVENOUS | Status: DC
  Administered 2012-05-23: 1000 mL via INTRAVENOUS

## 2012-05-23 MED ORDER — MIDAZOLAM HCL 5 MG/5ML IJ SOLN
INTRAMUSCULAR | Status: AC
  Filled 2012-05-23: qty 10

## 2012-05-23 MED ORDER — STERILE WATER FOR IRRIGATION IR SOLN
Status: DC | PRN
  Administered 2012-05-23: 12:00:00

## 2012-05-23 MED ORDER — MIDAZOLAM HCL 5 MG/5ML IJ SOLN
INTRAMUSCULAR | Status: DC | PRN
  Administered 2012-05-23: 2 mg via INTRAVENOUS
  Administered 2012-05-23: 1 mg via INTRAVENOUS

## 2012-05-23 MED ORDER — ONDANSETRON HCL 4 MG/2ML IJ SOLN
INTRAMUSCULAR | Status: DC | PRN
  Administered 2012-05-23: 4 mg via INTRAVENOUS

## 2012-05-23 MED ORDER — MEPERIDINE HCL 100 MG/ML IJ SOLN
INTRAMUSCULAR | Status: DC | PRN
  Administered 2012-05-23 (×2): 25 mg via INTRAVENOUS

## 2012-05-23 NOTE — Telephone Encounter (Signed)
Per RMR-Give pt dexilant samples. #4 boxes at the front desk.

## 2012-05-23 NOTE — Interval H&P Note (Signed)
History and Physical Interval Note:  05/23/2012 12:05 PM  Sierra Good  has presented today for surgery, with the diagnosis of Refractory GERD, Intermittant Nausea and Vomiting  The various methods of treatment have been discussed with the patient and family. After consideration of risks, benefits and other options for treatment, the patient has consented to  Procedure(s) (LRB) with comments: ESOPHAGOGASTRODUODENOSCOPY (EGD) WITH ESOPHAGEAL DILATION (N/A) - 12:15 as a surgical intervention .  The patient's history has been reviewed, patient examined, no change in status, stable for surgery.  I have reviewed the patient's chart and labs.  Questions were answered to the patient's satisfaction.     [name]  As above. Dexilant is controlling patient's reflux symptoms. Well as she reports today. EGD with esophageal dilation as appropriate.The risks, benefits, limitations, alternatives and imponderables have been reviewed with the patient. Potential for esophageal dilation, biopsy, etc. have also been reviewed.  Questions have been answered. All parties agreeable. Coumadin held 4 days ago

## 2012-05-23 NOTE — Op Note (Signed)
[facility] [address] [place], [address]   ENDOSCOPY PROCEDURE REPORT  PATIENT: Sierra Good, Name  MR#: [id number] BIRTHDATE: 06/30/1922 , 89  yrs. old GENDER: Female ENDOSCOPIST: R.  Roetta Sessions, MD FACP FACG REFERRED BY:  Doreen Beam, M.D. Dr. Mallie Mussel PROCEDURE DATE:  05/23/2012 PROCEDURE:     EGD with Elease Hashimoto dilation  INDICATIONS:     Esophageal dysphagia to solids; reflux symptoms now well controlled on Dexilant  INFORMED CONSENT:   The risks, benefits, limitations, alternatives and imponderables have been discussed.  The potential for biopsy, esophogeal dilation, etc. have also been reviewed.  Questions have been answered.  All parties agreeable.  Please see the history and physical in the medical record for more information.  MEDICATIONS:     IV Versed and Demerol he mobilizes. Cetacaine spray Zofran 4 mg IV to prevent . Peri-procedure nausea  DESCRIPTION OF PROCEDURE:   The Pentax Gastroscope [id number] endoscope was introduced through the mouth and advanced to the second portion of the duodenum without difficulty or limitations. The mucosal surfaces were surveyed very carefully during advancement of the scope and upon withdrawal.  Retroflexion view of the proximal stomach and esophagogastric junction was performed.      FINDINGS: Noncritical. Schatzki's ring. No esophagitis. No Barrett's esophagus. Stomach empty. Small to moderate size hiatal hernia. No ulcer or infiltrating process seen.  Patent pylorus. Normal first and second portion of the duodenum  THERAPEUTIC / DIAGNOSTIC MANEUVERS PERFORMED:  A 54 French Maloney dilator was passed to full insertion easily. A look back revealed no apparent complication related to this maneuver.   COMPLICATIONS:  None  IMPRESSION:   Schatzki's ring-dilated as described above. Hiatal hernia.  RECOMMENDATIONS:   Continue Dexilant 60 mg orally daily. Arrange for the patient to stop by my office on the  way home to get free samples.  Office visit with [place] in 3 months.    _______________________________ R. Roetta Sessions, MD FACP [facility] eSigned:  R. Roetta Sessions, MD FACP [facility] 05/23/2012 12:40 PM     CC:

## 2012-05-23 NOTE — H&P (View-Only) (Signed)
Quick Note:  Reviewed at OV ______ 

## 2012-05-26 ENCOUNTER — Encounter (HOSPITAL_COMMUNITY): Payer: Self-pay | Admitting: Internal Medicine

## 2012-05-27 ENCOUNTER — Encounter (HOSPITAL_COMMUNITY): Payer: Medicare Other | Attending: Oncology

## 2012-05-27 DIAGNOSIS — Z452 Encounter for adjustment and management of vascular access device: Secondary | ICD-10-CM

## 2012-05-27 DIAGNOSIS — Z95828 Presence of other vascular implants and grafts: Secondary | ICD-10-CM

## 2012-05-27 DIAGNOSIS — C8581 Other specified types of non-Hodgkin lymphoma, lymph nodes of head, face, and neck: Secondary | ICD-10-CM | POA: Insufficient documentation

## 2012-05-27 DIAGNOSIS — Z9889 Other specified postprocedural states: Secondary | ICD-10-CM | POA: Insufficient documentation

## 2012-05-27 DIAGNOSIS — C8591 Non-Hodgkin lymphoma, unspecified, lymph nodes of head, face, and neck: Secondary | ICD-10-CM

## 2012-05-27 LAB — COMPREHENSIVE METABOLIC PANEL
BUN: 18 mg/dL (ref 6–23)
Calcium: 9.9 mg/dL (ref 8.4–10.5)
Creatinine, Ser: 0.84 mg/dL (ref 0.50–1.10)
GFR calc Af Amer: 70 mL/min — ABNORMAL LOW (ref >90)
GFR calc non Af Amer: 61 mL/min — ABNORMAL LOW (ref >90)
Glucose, Bld: 81 mg/dL (ref 70–99)
Sodium: 137 mEq/L (ref 135–145)
Total Protein: 6.7 g/dL (ref 6.0–8.3)

## 2012-05-27 LAB — DIFFERENTIAL
Basophils Relative: 0 % (ref 0–1)
Eosinophils Absolute: 0.2 10*3/uL (ref 0.0–0.7)
Eosinophils Relative: 2 % (ref 0–5)
Monocytes Absolute: 0.5 10*3/uL (ref 0.1–1.0)
Monocytes Relative: 7 % (ref 3–12)
Neutrophils Relative %: 75 % (ref 43–77)

## 2012-05-27 LAB — CBC
Hemoglobin: 15 g/dL (ref 12.0–15.0)
MCH: 33.7 pg (ref 26.0–34.0)
MCHC: 28.4 g/dL — ABNORMAL LOW (ref 30.0–36.0)
MCV: 118.7 fL — ABNORMAL HIGH (ref 78.0–100.0)

## 2012-05-27 MED ORDER — SODIUM CHLORIDE 0.9 % IJ SOLN
10.0000 mL | INTRAMUSCULAR | Status: DC | PRN
  Administered 2012-05-27: 10 mL via INTRAVENOUS
  Filled 2012-05-27: qty 10

## 2012-05-27 MED ORDER — SODIUM CHLORIDE 0.9 % IJ SOLN
INTRAMUSCULAR | Status: AC
  Filled 2012-05-27: qty 10

## 2012-05-27 MED ORDER — HEPARIN SOD (PORK) LOCK FLUSH 100 UNIT/ML IV SOLN
500.0000 [IU] | Freq: Once | INTRAVENOUS | Status: AC
  Administered 2012-05-27: 500 [IU] via INTRAVENOUS
  Filled 2012-05-27: qty 5

## 2012-05-27 MED ORDER — HEPARIN SOD (PORK) LOCK FLUSH 100 UNIT/ML IV SOLN
INTRAVENOUS | Status: AC
  Filled 2012-05-27: qty 5

## 2012-05-27 NOTE — Progress Notes (Signed)
Sierra Good presented for Portacath access and flush. Proper placement of portacath confirmed by CXR. Portacath located lt chest wall accessed with  H 20 needle. Good blood return present. Portacath flushed with 20ml NS and 500U/5ml Heparin and needle removed intact. Procedure without incident. Patient tolerated procedure well.   

## 2012-05-30 ENCOUNTER — Other Ambulatory Visit (HOSPITAL_COMMUNITY): Payer: Self-pay

## 2012-05-30 DIAGNOSIS — C8591 Non-Hodgkin lymphoma, unspecified, lymph nodes of head, face, and neck: Secondary | ICD-10-CM

## 2012-06-10 ENCOUNTER — Other Ambulatory Visit (HOSPITAL_COMMUNITY): Payer: Self-pay

## 2012-06-24 ENCOUNTER — Other Ambulatory Visit (HOSPITAL_COMMUNITY): Payer: Self-pay

## 2012-06-27 ENCOUNTER — Encounter (HOSPITAL_COMMUNITY): Payer: Self-pay | Admitting: Oncology

## 2012-06-27 ENCOUNTER — Encounter (HOSPITAL_COMMUNITY): Payer: Medicare Other | Attending: Oncology | Admitting: Oncology

## 2012-06-27 VITALS — BP 160/78 | HR 54 | Temp 97.9°F | Resp 18 | Wt 138.5 lb

## 2012-06-27 DIAGNOSIS — C8581 Other specified types of non-Hodgkin lymphoma, lymph nodes of head, face, and neck: Secondary | ICD-10-CM

## 2012-06-27 DIAGNOSIS — C8591 Non-Hodgkin lymphoma, unspecified, lymph nodes of head, face, and neck: Secondary | ICD-10-CM

## 2012-06-27 MED ORDER — HEPARIN SOD (PORK) LOCK FLUSH 100 UNIT/ML IV SOLN
INTRAVENOUS | Status: AC
  Filled 2012-06-27: qty 5

## 2012-06-27 MED ORDER — SODIUM CHLORIDE 0.9 % IJ SOLN
10.0000 mL | INTRAMUSCULAR | Status: DC | PRN
  Administered 2012-06-27: 10 mL via INTRAVENOUS
  Filled 2012-06-27: qty 10

## 2012-06-27 MED ORDER — HEPARIN SOD (PORK) LOCK FLUSH 100 UNIT/ML IV SOLN
500.0000 [IU] | Freq: Once | INTRAVENOUS | Status: DC
  Administered 2012-06-27: 500 [IU] via INTRAVENOUS
  Filled 2012-06-27: qty 5

## 2012-06-27 MED ORDER — LIDOCAINE-PRILOCAINE 2.5-2.5 % EX CREA: TOPICAL_CREAM | CUTANEOUS | Status: DC | PRN

## 2012-06-27 NOTE — Addendum Note (Signed)
Addended by: Edythe Lynn A on: 06/27/2012 11:53 AM   Modules accepted: Orders, SmartSet

## 2012-06-27 NOTE — Progress Notes (Signed)
[place] Buikema presented for Portacath access and flush. Proper placement of portacath confirmed by CXR. Portacath located lt chest wall accessed with  H 20 needle. Good blood return present. Portacath flushed with 20ml NS and 500U/59ml Heparin and needle removed intact. Procedure without incident. Patient tolerated procedure well.

## 2012-06-27 NOTE — Progress Notes (Signed)
Problem number 1 stage I AE versus stage II, diffuse large B-cell lymphoma occurring predominantly in the nasal cavity on the right side with one lymph node on the right also apparently involved with this CD20 positive lymphoma treated with 3 cycles of R.-CVP using modified doses for cycles 2 and 3 because of neutropenia, severe weakness, fatigue, atrial fibrillation and all this required hospitalization after cycle 1. After chemotherapy she received radiation therapy by Dr. Roselind Messier. We established a complete response. Problem #2 atrial fibrillation status post cardioversion and she appears to be normal sinus rhythm presently. Problem #3 multiple skin lesions in the past Problem #4 chronic weakness and fatigue which I suspect is due to a multiplicity of issues including age, her cancer, chemotherapy, radiation therapy, and atrial fibrillation issues. She is here today with one of her daughters and is doing well. She still living independently. She does use a walker inside and outside the house essentially. She has no B. symptomatology whatsoever. Her heart is doing well.  Oncology review of systems remains noncontributory.  BP 160/78  Pulse 54  Temp 97.9 F (36.6 C) (Oral)  Resp 18  Wt 138 lb 8 oz (62.823 kg)  She is in no acute distress. She has looks very very stable. She has no palpable lymphadenopathy in the cervical, supraclavicular, infraclavicular, axillary or inguinal areas. She has no arm or leg edema. Port is intact. Lungs are clear to auscultation and percussion. Heart shows a regular rhythm and rate without murmur rub or gallop. She does not have a nasal voice. Facial symmetry is intact.  Her laboratory work is also excellent with a normal CBC and differential, normal LDH, normal liver enzymes, etc. We will continue see her every 3 months or 12 more months. At that point in time we can go to every 6 months. I do not think we need to rescan her unless she becomes symptomatic or physical  exam change occurs or laboratory studies change. If all is well 12 months I think she could also have her Port-A-Cath removed.

## 2012-06-27 NOTE — Patient Instructions (Signed)
.  [facility] Discharge Instructions  RECOMMENDATIONS MADE BY THE CONSULTANT AND ANY TEST RESULTS WILL BE SENT TO YOUR REFERRING PHYSICIAN.  EXAM FINDINGS BY THE PHYSICIAN TODAY AND SIGNS OR SYMPTOMS TO REPORT TO CLINIC OR PRIMARY PHYSICIAN: exam good   INSTRUCTIONS GIVEN AND DISCUSSED: Port flush today  SPECIAL INSTRUCTIONS/FOLLOW-UP: You will see our PA in 3 months  Thank you for choosing [facility] to provide your oncology and hematology care.  To afford each patient quality time with our providers, please arrive at least 15 minutes before your scheduled appointment time.  With your help, our goal is to use those 15 minutes to complete the necessary work-up to ensure our physicians have the information they need to help with your evaluation and healthcare recommendations.    Effective January 1st, 2014, we ask that you re-schedule your appointment with our physicians should you arrive 10 or more minutes late for your appointment.  We strive to give you quality time with our providers, and arriving late affects you and other patients whose appointments are after yours.    Again, thank you for choosing [facility].  Our hope is that these requests will decrease the amount of time that you wait before being seen by our physicians.       _____________________________________________________________  Should you have questions after your visit to [facility], please contact our office at [phone] between the hours of 8:30 a.m. and 5:00 p.m.  Voicemails left after 4:30 p.m. will not be returned until the following business day.  For prescription refill requests, have your pharmacy contact our office with your prescription refill request.

## 2012-06-28 LAB — FOLATE: Folate: >20 ng/mL

## 2012-06-28 LAB — VITAMIN B12: Vitamin B-12: 1075 pg/mL — ABNORMAL HIGH (ref 211–911)

## 2012-07-23 ENCOUNTER — Other Ambulatory Visit: Payer: Self-pay

## 2012-07-29 ENCOUNTER — Other Ambulatory Visit (HOSPITAL_COMMUNITY): Payer: Self-pay

## 2012-08-08 ENCOUNTER — Encounter (HOSPITAL_COMMUNITY): Payer: Self-pay

## 2012-08-14 ENCOUNTER — Ambulatory Visit: Payer: Self-pay | Admitting: Cardiovascular Disease

## 2012-08-14 DIAGNOSIS — Z7901 Long term (current) use of anticoagulants: Secondary | ICD-10-CM

## 2012-08-14 DIAGNOSIS — I4891 Unspecified atrial fibrillation: Secondary | ICD-10-CM

## 2012-08-15 ENCOUNTER — Telehealth (HOSPITAL_COMMUNITY): Payer: Self-pay | Admitting: *Deleted

## 2012-08-15 ENCOUNTER — Encounter (HOSPITAL_COMMUNITY): Payer: Medicare Other | Attending: Oncology

## 2012-08-15 DIAGNOSIS — C8581 Other specified types of non-Hodgkin lymphoma, lymph nodes of head, face, and neck: Secondary | ICD-10-CM | POA: Insufficient documentation

## 2012-08-15 DIAGNOSIS — Z452 Encounter for adjustment and management of vascular access device: Secondary | ICD-10-CM

## 2012-08-15 DIAGNOSIS — C8591 Non-Hodgkin lymphoma, unspecified, lymph nodes of head, face, and neck: Secondary | ICD-10-CM

## 2012-08-15 LAB — COMPREHENSIVE METABOLIC PANEL
ALT: 13 U/L (ref 0–35)
AST: 17 U/L (ref 0–37)
Albumin: 3.7 g/dL (ref 3.5–5.2)
CO2: 26 mEq/L (ref 19–32)
Calcium: 10 mg/dL (ref 8.4–10.5)
Chloride: 102 mEq/L (ref 96–112)
Creatinine, Ser: 0.73 mg/dL (ref 0.50–1.10)
GFR calc non Af Amer: 75 mL/min — ABNORMAL LOW (ref >90)
Sodium: 138 mEq/L (ref 135–145)
Total Bilirubin: 0.4 mg/dL (ref 0.3–1.2)

## 2012-08-15 LAB — CBC
Platelets: 216 10*3/uL (ref 150–400)
RBC: 4.53 MIL/uL (ref 3.87–5.11)
RDW: 15.2 % (ref 11.5–15.5)
WBC: 7.7 10*3/uL (ref 4.0–10.5)

## 2012-08-15 LAB — DIFFERENTIAL
Basophils Absolute: 0 10*3/uL (ref 0.0–0.1)
Lymphocytes Relative: 16 % (ref 12–46)
Lymphs Abs: 1.2 10*3/uL (ref 0.7–4.0)
Neutrophils Relative %: 75 % (ref 43–77)

## 2012-08-15 LAB — LACTATE DEHYDROGENASE: LDH: 368 U/L — ABNORMAL HIGH (ref 94–250)

## 2012-08-15 MED ORDER — HEPARIN SOD (PORK) LOCK FLUSH 100 UNIT/ML IV SOLN
INTRAVENOUS | Status: AC
  Filled 2012-08-15: qty 5

## 2012-08-15 MED ORDER — HEPARIN SOD (PORK) LOCK FLUSH 100 UNIT/ML IV SOLN
500.0000 [IU] | Freq: Once | INTRAVENOUS | Status: AC
  Administered 2012-08-15: 500 [IU] via INTRAVENOUS
  Filled 2012-08-15: qty 5

## 2012-08-15 MED ORDER — SODIUM CHLORIDE 0.9 % IJ SOLN
10.0000 mL | INTRAMUSCULAR | Status: DC | PRN
  Administered 2012-08-15: 10 mL via INTRAVENOUS
  Filled 2012-08-15: qty 10

## 2012-08-15 NOTE — Telephone Encounter (Signed)
Patient here for port flush today.  Patient states that she has "puffiness" behind rt knee. It is slightly swollen and bruised in appearance. She states that Dr. Sherril Croon has treated as a baker's cyst and with prednisone it went away. However 2 weeks after finishing med it has returned. I advised her to return to see Dr. Sherril Croon.

## 2012-08-15 NOTE — Progress Notes (Signed)
[place] Sunday presented for Portacath access and flush. Proper placement of portacath confirmed by CXR. Portacath located lt chest wall accessed with  H 20 needle. Good blood return present. Portacath flushed with 20ml NS and 500U/21ml Heparin and needle removed intact. Procedure without incident. Patient tolerated procedure well. Patient states that she has "puffiness" behind rt knee. It is slightly swollen and bruised in appearance. She states that Dr. Sherril Croon has treated as a baker's cyst and with prednisone it went away. However 2 weeks after finishing med it has returned. I advised her to return to see Dr. Sherril Croon.

## 2012-08-17 ENCOUNTER — Other Ambulatory Visit (HOSPITAL_COMMUNITY): Payer: Self-pay | Admitting: *Deleted

## 2012-08-17 DIAGNOSIS — C8591 Non-Hodgkin lymphoma, unspecified, lymph nodes of head, face, and neck: Secondary | ICD-10-CM

## 2012-08-18 ENCOUNTER — Encounter: Payer: Self-pay | Admitting: Internal Medicine

## 2012-08-22 ENCOUNTER — Ambulatory Visit: Payer: Self-pay | Admitting: Urgent Care

## 2012-08-22 ENCOUNTER — Other Ambulatory Visit (HOSPITAL_COMMUNITY): Payer: Self-pay

## 2012-08-24 ENCOUNTER — Other Ambulatory Visit (HOSPITAL_COMMUNITY): Payer: Self-pay

## 2012-08-24 ENCOUNTER — Encounter (HOSPITAL_BASED_OUTPATIENT_CLINIC_OR_DEPARTMENT_OTHER): Payer: Medicare Other

## 2012-08-24 DIAGNOSIS — C8591 Non-Hodgkin lymphoma, unspecified, lymph nodes of head, face, and neck: Secondary | ICD-10-CM

## 2012-08-24 NOTE — Progress Notes (Signed)
Labs drawn today for ldh,sed rate

## 2012-08-29 ENCOUNTER — Telehealth: Payer: Self-pay

## 2012-08-29 NOTE — Telephone Encounter (Signed)
pts daughter called requesting Dexilant samples. #4 boxes at the front desk.

## 2012-08-29 NOTE — Telephone Encounter (Signed)
noted 

## 2012-09-08 ENCOUNTER — Encounter: Payer: Self-pay | Admitting: Gastroenterology

## 2012-09-08 ENCOUNTER — Ambulatory Visit (INDEPENDENT_AMBULATORY_CARE_PROVIDER_SITE_OTHER): Payer: Medicare Other | Admitting: Gastroenterology

## 2012-09-08 VITALS — BP 123/71 | HR 61 | Temp 98.1°F | Ht 66.0 in | Wt 140.2 lb

## 2012-09-08 DIAGNOSIS — R1310 Dysphagia, unspecified: Secondary | ICD-10-CM

## 2012-09-08 DIAGNOSIS — K219 Gastro-esophageal reflux disease without esophagitis: Secondary | ICD-10-CM

## 2012-09-08 NOTE — Progress Notes (Signed)
Primary Care Physician: [name], MD  Primary Gastroenterologist:  Roetta Sessions, MD   Chief Complaint  Patient presents with  . Follow-up    HPI: Sierra Good is a 79 y.o. female here for f/u of refractory GERD. EGD/ED 05/2012, Schatzki ring, hiatal hernia. Doing well on Dexilant but her copay is $95 per month. She would like to try something cheaper. No heartburn, abd pain, vomiting, constipation, diarrhea, melena, brbpr.   Current Outpatient Prescriptions  Medication Sig Dispense Refill  . amLODipine (NORVASC) 5 MG tablet Take 2.5 mg by mouth daily.       . Calcium-Vitamin D (CALTRATE 600 PLUS-VIT D PO) Take 1 tablet by mouth daily.      Marland Kitchen CARAFATE 1 GM/10ML suspension Take 1 g by mouth QID.       Marland Kitchen clobetasol cream (TEMOVATE) 0.05 % Apply 1 application topically 2 (two) times daily.       Marland Kitchen DEXILANT 60 MG capsule Take 60 mg by mouth daily.       Marland Kitchen lidocaine-prilocaine (EMLA) cream Apply topically as needed.  30 g  1  . Multiple Vitamins-Minerals (CENTRUM SILVER ULTRA WOMENS) TABS Take 1 tablet by mouth daily.        Marland Kitchen oxybutynin (DITROPAN) 5 MG tablet Take 5 mg by mouth 2 (two) times daily.      . polyethylene glycol powder (GLYCOLAX/MIRALAX) powder Take 17 g by mouth daily as needed. Constipation.      . senna (SENOKOT) 8.6 MG tablet Take 1 tablet by mouth daily as needed. Constipation.      Marland Kitchen warfarin (COUMADIN) 4 MG tablet Take 4 mg by mouth daily. 4 mg daily except 2 days she takes 1/2 tablet       No current facility-administered medications for this visit.   Facility-Administered Medications Ordered in Other Visits  Medication Dose Route Frequency Provider Last Rate Last Dose  . heparin lock flush 100 unit/mL  500 Units Intravenous Once Ellouise Newer, PA-C      . sodium chloride 0.9 % injection 10 mL  10 mL Intravenous PRN Ellouise Newer, PA-C        Allergies as of 09/08/2012 - Review Complete 09/08/2012  Allergen Reaction Noted  . Statins Other (See  Comments) 12/12/2010    ROS:  General: Negative for anorexia, weight loss, fever, chills, fatigue, weakness. ENT: Negative for hoarseness, difficulty swallowing , nasal congestion. CV: Negative for chest pain, angina, palpitations, dyspnea on exertion, peripheral edema.  Respiratory: Negative for dyspnea at rest, dyspnea on exertion, cough, sputum, wheezing.  GI: See history of present illness. GU:  Negative for dysuria, hematuria, urinary incontinence, urinary frequency, nocturnal urination.  Endo: Negative for unusual weight change.    Physical Examination:   BP 123/71  Pulse 61  Temp(Src) 98.1 F (36.7 C) (Oral)  Ht 5\' 6"  (1.676 m)  Wt 140 lb 3.2 oz (63.594 kg)  BMI 22.64 kg/m2  General: Well-nourished, well-developed in no acute distress.  Eyes: No icterus. Mouth: Oropharyngeal mucosa moist and pink , no lesions erythema or exudate. Lungs: Clear to auscultation bilaterally.  Heart: Regular rate and rhythm, no murmurs rubs or gallops.  Abdomen: Bowel sounds are normal, nontender, nondistended, no hepatosplenomegaly or masses, no abdominal bruits or hernia , no rebound or guarding.   Extremities: No lower extremity edema. No clubbing or deformities. Neuro: Alert and oriented x 4   Skin: Warm and dry, no jaundice.   Psych: Alert and cooperative, normal mood and affect.  Labs:  Lab Results  Component Value Date   CREATININE 0.73 08/15/2012   BUN 14 08/15/2012   NA 138 08/15/2012   K 4.0 08/15/2012   CL 102 08/15/2012   CO2 26 08/15/2012   Lab Results  Component Value Date   ALT 13 08/15/2012   AST 17 08/15/2012   ALKPHOS 79 08/15/2012   BILITOT 0.4 08/15/2012    Lab Results  Component Value Date   WBC 7.7 08/15/2012   HGB 14.6 08/15/2012   HCT 43.5 08/15/2012   MCV 96.0 08/15/2012   PLT 216 08/15/2012    Imaging Studies: No results found.

## 2012-09-08 NOTE — Patient Instructions (Addendum)
Continue Dexilant 60mg  one capsule before breakfast daily.  One week before you run out of Dexilant, call the office and request to try generic Aciphex. We will see if your insurance will cover it at a lower cost for you.

## 2012-09-09 ENCOUNTER — Encounter: Payer: Self-pay | Admitting: Gastroenterology

## 2012-09-09 NOTE — Assessment & Plan Note (Signed)
Doing better but cannot afford the Dexilant. Wants to try generic Aciphex when she runs out of Dexilant. No longer having dysphagia since esophageal dilation. She will call for RX of Aciphex generic when she is almost out of Dexilant. If she has refractory GERD, she will let us know.

## 2012-09-12 NOTE — Progress Notes (Signed)
Cc PCP 

## 2012-09-19 ENCOUNTER — Encounter (HOSPITAL_COMMUNITY): Payer: Medicare Other | Attending: Oncology

## 2012-09-19 DIAGNOSIS — C8581 Other specified types of non-Hodgkin lymphoma, lymph nodes of head, face, and neck: Secondary | ICD-10-CM | POA: Insufficient documentation

## 2012-09-19 DIAGNOSIS — C8591 Non-Hodgkin lymphoma, unspecified, lymph nodes of head, face, and neck: Secondary | ICD-10-CM

## 2012-09-19 LAB — CBC WITH DIFFERENTIAL/PLATELET
Basophils Absolute: 0 10*3/uL (ref 0.0–0.1)
Eosinophils Absolute: 0.1 10*3/uL (ref 0.0–0.7)
Eosinophils Relative: 2 % (ref 0–5)
MCH: 32.7 pg (ref 26.0–34.0)
MCHC: 35 g/dL (ref 30.0–36.0)
MCV: 93.3 fL (ref 78.0–100.0)
Platelets: 235 10*3/uL (ref 150–400)
RDW: 14.2 % (ref 11.5–15.5)

## 2012-09-19 LAB — COMPREHENSIVE METABOLIC PANEL
ALT: 10 U/L (ref 0–35)
AST: 18 U/L (ref 0–37)
Calcium: 9.9 mg/dL (ref 8.4–10.5)
GFR calc Af Amer: 74 mL/min — ABNORMAL LOW (ref >90)
Sodium: 140 mEq/L (ref 135–145)
Total Protein: 7.1 g/dL (ref 6.0–8.3)

## 2012-09-19 LAB — LACTATE DEHYDROGENASE: LDH: 163 U/L (ref 94–250)

## 2012-09-19 MED ORDER — SODIUM CHLORIDE 0.9 % IJ SOLN
10.0000 mL | INTRAMUSCULAR | Status: DC | PRN
  Administered 2012-09-19: 10 mL via INTRAVENOUS
  Filled 2012-09-19: qty 10

## 2012-09-19 MED ORDER — HEPARIN SOD (PORK) LOCK FLUSH 100 UNIT/ML IV SOLN
INTRAVENOUS | Status: AC
  Filled 2012-09-19: qty 5

## 2012-09-19 MED ORDER — HEPARIN SOD (PORK) LOCK FLUSH 100 UNIT/ML IV SOLN
500.0000 [IU] | Freq: Once | INTRAVENOUS | Status: AC
  Administered 2012-09-19: 500 [IU] via INTRAVENOUS
  Filled 2012-09-19: qty 5

## 2012-09-19 NOTE — Progress Notes (Signed)
[place] Peragine presented for Portacath access and flush. Proper placement of portacath confirmed by CXR. Portacath located chest wall accessed with  H 20 needle. Good blood return present. Portacath flushed with 20ml NS and 500U/52ml Heparin and needle removed intact.  Specimen obtained for labs tests.   Procedure without incident. Patient tolerated procedure well.

## 2012-09-21 ENCOUNTER — Ambulatory Visit (HOSPITAL_COMMUNITY): Payer: Self-pay | Admitting: Oncology

## 2012-10-03 ENCOUNTER — Encounter (HOSPITAL_COMMUNITY): Payer: Self-pay | Admitting: Oncology

## 2012-10-03 ENCOUNTER — Encounter (HOSPITAL_BASED_OUTPATIENT_CLINIC_OR_DEPARTMENT_OTHER): Payer: Medicare Other | Admitting: Oncology

## 2012-10-03 VITALS — BP 161/67 | HR 59 | Temp 97.8°F | Resp 20 | Wt 139.8 lb

## 2012-10-03 DIAGNOSIS — C8591 Non-Hodgkin lymphoma, unspecified, lymph nodes of head, face, and neck: Secondary | ICD-10-CM

## 2012-10-03 DIAGNOSIS — C8581 Other specified types of non-Hodgkin lymphoma, lymph nodes of head, face, and neck: Secondary | ICD-10-CM

## 2012-10-03 DIAGNOSIS — I4891 Unspecified atrial fibrillation: Secondary | ICD-10-CM

## 2012-10-03 DIAGNOSIS — R5381 Other malaise: Secondary | ICD-10-CM

## 2012-10-03 NOTE — Patient Instructions (Addendum)
.  [facility] Discharge Instructions  RECOMMENDATIONS MADE BY THE CONSULTANT AND ANY TEST RESULTS WILL BE SENT TO YOUR REFERRING PHYSICIAN.  EXAM FINDINGS BY THE PHYSICIAN TODAY AND SIGNS OR SYMPTOMS TO REPORT TO CLINIC OR PRIMARY PHYSICIAN: Exam per Dr. Mariel Sleet   INSTRUCTIONS GIVEN AND DISCUSSED: We will contact physical therapy for a referral for strengthening and ambulation  SPECIAL INSTRUCTIONS/FOLLOW-UP: Labs in late July and to see [place] back in 3 months  Thank you for choosing [facility] to provide your oncology and hematology care.  To afford each patient quality time with our providers, please arrive at least 15 minutes before your scheduled appointment time.  With your help, our goal is to use those 15 minutes to complete the necessary work-up to ensure our physicians have the information they need to help with your evaluation and healthcare recommendations.    Effective January 1st, 2014, we ask that you re-schedule your appointment with our physicians should you arrive 10 or more minutes late for your appointment.  We strive to give you quality time with our providers, and arriving late affects you and other patients whose appointments are after yours.    Again, thank you for choosing [facility].  Our hope is that these requests will decrease the amount of time that you wait before being seen by our physicians.       _____________________________________________________________  Should you have questions after your visit to [facility], please contact our office at [phone] between the hours of 8:30 a.m. and 5:00 p.m.  Voicemails left after 4:30 p.m. will not be returned until the following business day.  For prescription refill requests, have your pharmacy contact our office with your prescription refill request.

## 2012-10-03 NOTE — Progress Notes (Signed)
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 After chemotherapy she received radiation therapy by Dr. Roselind Messier with establishment of a CR #2 atrial fibrillation status post cardioversion #3 multiple skin lesions in the past #4 chronic weakness and fatigue do to a multiplicity of issues including her age, her cancer, chemotherapy, radiation therapy, and atrial fibrillation and I believe she needs a physical therapy consultation more now than before. She has difficulty walking, take small steps, and is unsteady on her feet. We will make that referral.  Otherwise her oncology review of systems remains noncontributory. She speaks clearly without a nasal voice. She is not having any fevers chills or night sweats. Appetite is good. Her weight is stable. Other vital signs are stable. She has no lymphadenopathy. Lungs are clear. Heart shows a regular rhythm and rate without murmur rub or gallop. Abdomen remains soft and nontender without organomegaly. Throat is clear. Facial symmetry is intact. She is alert and oriented. She has no leg edema or arm edema.  Her laboratory work today was excellent. We will not do studies such as PET scans again unless she is symptomatic or has abnormal blood work.  We'll see her in 3 months

## 2012-10-18 ENCOUNTER — Ambulatory Visit (HOSPITAL_COMMUNITY)
Admission: RE | Admit: 2012-10-18 | Discharge: 2012-10-18 | Disposition: A | Discharge status: Home / Self Care | Payer: Medicare Other | Source: Ambulatory Visit | Attending: Oncology | Admitting: Oncology

## 2012-10-18 DIAGNOSIS — M6281 Muscle weakness (generalized): Secondary | ICD-10-CM | POA: Insufficient documentation

## 2012-10-18 DIAGNOSIS — IMO0001 Reserved for inherently not codable concepts without codable children: Secondary | ICD-10-CM | POA: Insufficient documentation

## 2012-10-18 DIAGNOSIS — R262 Difficulty in walking, not elsewhere classified: Secondary | ICD-10-CM | POA: Insufficient documentation

## 2012-10-19 DIAGNOSIS — M6281 Muscle weakness (generalized): Secondary | ICD-10-CM | POA: Insufficient documentation

## 2012-10-19 DIAGNOSIS — R262 Difficulty in walking, not elsewhere classified: Secondary | ICD-10-CM | POA: Insufficient documentation

## 2012-10-19 NOTE — Evaluation (Addendum)
Physical Therapy Evaluation  Patient Details  Name: Sierra Good MRN: [id number] Date of Birth: 03/02/1923  Today's Date: 10/18/2012 Time: 2440-1027 PT Time Calculation (min): 50 min Charges: 1 eval, 10' self care             Visit#: 1 of 8  Re-eval: 11/18/12 Assessment Diagnosis: Generalized weakness Next MD Visit: Dr. Mariel Sleet - August 28th  Authorization: [facility] Medicare    Authorization Time Period:    Authorization Visit#: 1 of 10   Past Medical History:  Past Medical History  Diagnosis Date  . GERD (gastroesophageal reflux disease)     takes omeprazole daily  . Hyperlipidemia   . Hypertension   . Osteoarthritis of lumbar spine   . PONV (postoperative nausea and vomiting) 2009    after total hip  . Deviated nasal septum     pt had septoplasty  . Nasal turbinate hypertrophy     bilateral  . Cancer     nasal cancer  . Cancer     cancer in the nose and in lower jaw  . Atrial fibrillation   . Dizziness   . Itching   . Diffuse large B cell lymphoma     Dr. Mariel Sleet -Nasal cavity, right sided lymph adenopathy  . OAB (overactive bladder)   . 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 Total hip arthroplasty  2009    Right, [facility] (general)  . Bunionectomy  20+yrs ago    bilateral, [facility]  . Hammer toe surgery      20+ yrs ago  . Cataract extraction w/ intraocular lens  implant, bilateral  2010    [facility]  . Nasal septoplasty w/ turbinoplasty  12/18/2010    Procedure: NASAL SEPTOPLASTY WITH TURBINATE REDUCTION;  Surgeon: Darletta Moll;  Location: [facility];  Service: ENT;  Laterality: Bilateral;  . Colon surgery  10/29/2008    Ingram-right hemicolectomy for tubulovillous adenoma (3cm) with HG glandular dysplasia  . Portacath placement  02/10/2011    Procedure: INSERTION PORT-A-CATH;  Surgeon: Marlane Hatcher;  Location: [facility];  Service: General;  Laterality: Left;  left subclavian   . Skin cancer removal  6/13  . Colonoscopy  09/28/2002    RMR: Normal colonic mucosa.  Redundant and elongated but otherwise normal appearing colon/normal rectum  . Colonoscopy  09/11/2008    RMR: Normal rectum/Diminutive sigmoid polyp status post snare removal/  Flat sessile semilunar lesion at and in the ileocecal valve status post hot snare piecemeal debulking as described above  . Colonoscopy  01/06/10    Rourk-normal rectum, normal residual colon status post right hemicolectomy. Next colonoscopy in 01/2015 health permiting  . Esophagogastroduodenoscopy (egd) with esophageal dilation  05/23/2012    OZD:[id number] ring-dilated as described above. Hiatal hernia    Subjective Symptoms/Limitations Symptoms: Pt is referred to PT for generalized weakness secondary to chemo and radiation treatment for cancer. Her c/co is decreased strength to BLE, difficulty walking, difficulty with functional activities.  She is currently ambulating with a RW at home and unable to walk at the store. She has a caretaker with her 2x a week from 9-2 to help her with household activities and taking her to doctors appointments.  She has a daughter who checks in with her every night and lives close and a son who lives in [place].  How long can you walk comfortably?: With RW for 5 minutes  Patient Stated Goals: "i want to be able to get out and work in my yard and be able to walk better in the stores." Pain Assessment Currently in Pain?: No/denies Pain Score: 0-No pain  Precautions/Restrictions  Precautions Precaution Comments: Hx of Cancer  Balance Screening Balance Screen Has the patient fallen in the past 6 months: Yes How many times?: 1 (last Sunday) Has the patient had a decrease in activity level because of a fear of falling? : Yes Is the patient reluctant to leave their home because of a fear of falling? : No  Prior Functioning  Prior Function Level of Independence: Requires assistive device for  independence Driving: No Comments: She enjoys being outdoors, planting flowers  Sensation/Coordination/Flexibility/Functional Tests Functional Tests Functional Tests: ABC: 8%Activity-specific Balance Confidence Test: 8%  Assessment RLE Strength Right Hip Flexion: 3/5 Right Hip ABduction: 3/5 Right Hip ADduction: 4/5 Right Knee Flexion: 3+/5 Right Knee Extension: 3+/5 LLE Strength LLE Overall Strength Comments: taken in sitting position Left Hip Flexion: 3/5 Left Hip ABduction: 3+/5 Left Hip ADduction: 4/5 Left Knee Flexion: 5/5 Left Knee Extension: 4/5  Mobility/Balance  Ambulation/Gait Ambulation/Gait: Yes Ambulation Distance (Feet): 120 Feet (2 minutes) Berg Balance Test Sit to Stand: Able to stand using hands after several tries Standing Unsupported: Able to stand 30 seconds unsupported Sitting with Back Unsupported but Feet Supported on Floor or Stool: Able to sit safely and securely 2 minutes Stand to Sit: Controls descent by using hands Transfers: Able to transfer with verbal cueing and /or supervision Standing Unsupported with Eyes Closed: Able to stand 3 seconds Standing Ubsupported with Feet Together: Able to place feet together independently but unable to hold for 30 seconds From Standing, Reach Forward with Outstretched Arm: Can reach forward >12 cm safely (5") From Standing Position, Pick up Object from Floor: Able to pick up shoe, needs supervision From Standing Position, Turn to Look Behind Over each Shoulder: Turn sideways only but maintains balance Turn 360 Degrees: Able to turn 360 degrees safely but slowly Standing Unsupported, Alternately Place Feet on Step/Stool: Needs assistance to keep from falling or unable to try Standing Unsupported, One Foot in Front: Needs help to step but can hold 15 seconds Standing on One Leg: Unable to try or needs assist to prevent fall Total Score: 28 Timed Up and Go Test TUG: Normal TUG Normal TUG (seconds): 38 (w/RW)    Exercise/Treatments Seated Other Seated Knee Exercises: iso hip adduction and abd 5x10 sec holds each Supine Bridges: 5 reps  Physical Therapy Assessment and Plan PT Assessment and Plan Clinical Impression Statement: Pt is an 79 year old female referred to PT for generalized weakness causing decreased independence with following impairments listed below.  At this time she currently has a caretaker 2x/wk to help her in the community and uses a RW at home, but has significant fear of falling.  Pt will benefit from skilled therapeutic intervention in order to improve on the following deficits: Decreased activity tolerance;Decreased balance;Difficulty walking;Impaired perceived functional ability Rehab Potential: Good PT Frequency: Min 2X/week PT Duration: 6 weeks PT Treatment/Interventions: DME instruction;Gait training;Stair training;Functional mobility training;Therapeutic activities;Therapeutic exercise;Balance training;Neuromuscular re-education;Patient/family education PT Plan: Provide with Prevention for Falls located in Elderly in Exit Care (edited for her). NuStep and begin [place] balance program activities, progress to numbers on wall, cone rotations and functional activities to improve ABC, Berg and TUG results.     Goals Home Exercise Program Pt will Perform Home Exercise Program: Independently PT Short Term Goals Time to Complete  Short Term Goals: 3 weeks PT Short Term Goal 1: Pt will improve her LE strength by 1 muscle grade for greater ease with sit to stand. PT Short Term Goal 2: Pt will improve her Berg Balance Test to 45/56 in order to decrease risk of fall. PT Short Term Goal 3: Pt will improved dynamic balance and ambulate with modified independence with RW or rollator in indoor envirnmoment. PT Long Term Goals Time to Complete Long Term Goals:  (6 weeks) PT Long Term Goal 1: Pt will improver her activity tolerance and ambulate with LRAD x30 minutes in indoor and outdoor  environment in order to continue with shopping and lesiure activities.  PT Long Term Goal 2: Pt will improve her ABC to greater than 40% for improved percieved functional ability.  Long Term Goal 3: Pt will improve her TUG time to less than 20 seconds with LRAD for improved gait speed and safety in the community.  Long Term Goal 4: Pt will improve mobility and ambulate 429 feet in 2 minutes for appropriate age.   Problem List Patient Active Problem List   Diagnosis Date Noted  . Muscle weakness (generalized) 10/19/2012  . Difficulty in walking 10/19/2012  . Long term (current) use of anticoagulants 08/14/2012  . Dysphagia 05/11/2012  . GERD (gastroesophageal reflux disease) 05/11/2012  . Nausea alone 02/23/2011  . SOB (shortness of breath) on exertion 02/23/2011  . Lymphoma of lymph nodes of head, face, and/or neck 01/12/2011  . Atrial fibrillation 11/13/2008  . BRADYCARDIA 11/13/2008  . HYPERLIPIDEMIA 11/12/2008  . HYPERTENSION 11/12/2008  . SYNCOPE, HX OF 11/12/2008    PT - End of Session Equipment Utilized During Treatment: Gait belt Activity Tolerance: Patient tolerated treatment well PT Plan of Care PT Home Exercise Plan: see scanned report PT Patient Instructions: importance of HEP to supplement PT, safety with ambulating with RW in the home, answered questions about diagnosis Consulted and Agree with Plan of Care: Patient  GP Functional Assessment Tool Used: ABC: 8% and clinical observation  [facility] PT THERAPY MOBILITY GOAL STATUS [id number] 10/18/2012 Matilde Haymaker, PT 1 Filed [facility] PT THERAPY MOBILITY CURRENT STATUS [id number] 10/18/2012 Matilde Haymaker, PT 1 Filed  [address] [place], MPT, ATC 10/19/2012, 9:25 AM  Physician Documentation Your signature is required to indicate approval of the treatment plan as stated above.  Please sign and either send electronically or make a copy of this report for your files and return this physician signed original.   Please mark one 1.__approve of  plan  2. ___approve of plan with the following conditions.   ______________________________                                                          _____________________ Physician Signature  Date  

## 2012-10-20 ENCOUNTER — Telehealth: Payer: Self-pay | Admitting: Internal Medicine

## 2012-10-20 ENCOUNTER — Ambulatory Visit (HOSPITAL_COMMUNITY)
Admission: RE | Admit: 2012-10-20 | Discharge: 2012-10-20 | Disposition: A | Discharge status: Home / Self Care | Payer: Medicare Other | Source: Ambulatory Visit | Attending: Oncology | Admitting: Oncology

## 2012-10-20 MED ORDER — PANTOPRAZOLE SODIUM 40 MG PO TBEC: 40.0000 mg | DELAYED_RELEASE_TABLET | Freq: Every day | ORAL | Status: DC

## 2012-10-20 NOTE — Progress Notes (Signed)
Physical Therapy Treatment Patient Details  Name: Sierra Good MRN: [id number] Date of Birth: [birth year]  Today's Date: 10/20/2012 Time: 1105-1150 PT Time Calculation (min): 45 min Visit#: 2 of 8  Re-eval: 11/18/12 Charges:  NMR 30', therex 10' Authorization: [employer] Medicare  Authorization Visit#: 2 of 10   Subjective: Symptoms/Limitations Symptoms: Pt.states she fell at home on sunday, fell backward and hit her head.  States she feels better today without dizziness.   Exercise/Treatments Standing Tandem Stance: 2 reps;30 secs SLS: Eyes open;Intermittent HHA;3 reps;15 secs Tandem Gait: Forward;1 rep Retro Gait: 1 rep Sidestepping: 1 rep Heel Raises: 10 reps Toe Raise: 10 reps Other Standing Exercises: hamstring curls 10 reps each  Seated Other Seated Exercises: Nustep level 3, 10'     Physical Therapy Assessment and Plan PT Assessment and Plan Clinical Impression Statement: Pt without c/o dizziness today.  Required 3 seated rest breaks during session.  Added new balance activities with most difficulty with tandem gait and stance.  Unable to perform SLS without use of UE for stability, able to complete with 1 finger assist for 15" max. Pt will benefit from skilled therapeutic intervention in order to improve on the following deficits: Decreased activity tolerance;Decreased balance;Difficulty walking;Impaired perceived functional ability Rehab Potential: Good PT Frequency: Min 2X/week PT Duration: 6 weeks PT Treatment/Interventions: DME instruction;Gait training;Stair training;Functional mobility training;Therapeutic activities;Therapeutic exercise;Balance training;Neuromuscular re-education;Patient/family education PT Plan: Progress [place] balance program activities, numbers on wall, cone rotations and functional activities to improve ABC, Berg and TUG results.      Problem List Patient Active Problem List   Diagnosis Date Noted  . Muscle weakness (generalized) 10/19/2012   . Difficulty in walking 10/19/2012  . Long term (current) use of anticoagulants 08/14/2012  . Dysphagia 05/11/2012  . GERD (gastroesophageal reflux disease) 05/11/2012  . Nausea alone 02/23/2011  . SOB (shortness of breath) on exertion 02/23/2011  . Lymphoma of lymph nodes of head, face, and/or neck 01/12/2011  . Atrial fibrillation 11/13/2008  . BRADYCARDIA 11/13/2008  . HYPERLIPIDEMIA 11/12/2008  . HYPERTENSION 11/12/2008  . SYNCOPE, HX OF 11/12/2008    PT - End of Session Equipment Utilized During Treatment: Gait belt Activity Tolerance: Patient tolerated treatment well General Behavior During Therapy: [facility] for tasks assessed/performed Cognition: WFL for tasks performed   Lurena Nida, PTA/CLT 10/20/2012, 12:36 PM

## 2012-10-20 NOTE — Telephone Encounter (Signed)
Joesphine Bare (daughter) called to see if Patient could get Dexilant samples because she will be running out mid week next week. Please advise [phone] Nurse said we had a few boxes for patient and daughter said she would be by in the morning and thanked [place].

## 2012-10-20 NOTE — Telephone Encounter (Addendum)
As discussed at OV. Let's see if we can find something cheaper. Generic aciphex does not appear to be on her formulary. Looks like pantoprazole is the lowest tier. I will send in RX. She will need to have INR checked one week after starting new med if she is still on coumdin.  She can have Dexilant samples while we await response from pharmacy.

## 2012-10-20 NOTE — Addendum Note (Signed)
Addended by: Tiffany Kocher on: 10/20/2012 03:01 PM   Modules accepted: Orders, Medications

## 2012-10-20 NOTE — Telephone Encounter (Signed)
Samples at the front desk 

## 2012-10-21 NOTE — Telephone Encounter (Signed)
Tried to call pts daughter-Mary, phone number was busy

## 2012-10-24 ENCOUNTER — Ambulatory Visit: Payer: Self-pay

## 2012-10-24 NOTE — Telephone Encounter (Signed)
Tried to call number busy 

## 2012-10-25 ENCOUNTER — Ambulatory Visit (INDEPENDENT_AMBULATORY_CARE_PROVIDER_SITE_OTHER): Payer: Medicare Other | Admitting: Pharmacist Clinician (PhC)/ Clinical Pharmacy Specialist

## 2012-10-25 DIAGNOSIS — Z7901 Long term (current) use of anticoagulants: Secondary | ICD-10-CM

## 2012-10-25 DIAGNOSIS — I4891 Unspecified atrial fibrillation: Secondary | ICD-10-CM

## 2012-10-25 NOTE — Telephone Encounter (Signed)
Tried to reach pts daughter x2 and was told both times that I had the wrong number. Will mail letter to pt.

## 2012-10-26 ENCOUNTER — Ambulatory Visit: Payer: Self-pay

## 2012-10-27 ENCOUNTER — Ambulatory Visit (HOSPITAL_COMMUNITY)
Admission: RE | Admit: 2012-10-27 | Discharge: 2012-10-27 | Disposition: A | Discharge status: Home / Self Care | Payer: Medicare Other | Source: Ambulatory Visit | Attending: Internal Medicine | Admitting: Internal Medicine

## 2012-10-27 DIAGNOSIS — R262 Difficulty in walking, not elsewhere classified: Secondary | ICD-10-CM

## 2012-10-27 DIAGNOSIS — M6281 Muscle weakness (generalized): Secondary | ICD-10-CM

## 2012-10-27 NOTE — Progress Notes (Signed)
Physical Therapy Treatment Patient Details  Name: Sierra Good MRN: [id number] Date of Birth: 1922-12-06  Today's Date: 10/27/2012 Time: 8413-2440 PT Time Calculation (min): 43 min Charge: Therex 23'', NMR 18'  Visit#: 3 of 8  Re-eval: 11/18/12 Assessment Diagnosis: Generalized weakness Next MD Visit: Dr. Mariel Sleet - August 28th  Authorization: [facility] Medicare  Authorization Time Period:    Authorization Visit#: 3 of 10   Subjective: Symptoms/Limitations Symptoms: Pt reported compliance with some of the HEP exercises, stated some exercises are too difficult to complete at home.  Currently pain free. Pain Assessment Currently in Pain?: No/denies  Precautions/Restrictions  Precautions Precaution Comments: Hx of Cancer  Exercise/Treatments Balance Exercises Standing Tandem Stance: 2 reps;30 secs Tandem Gait: Forward;1 rep Retro Gait: 1 rep Sidestepping: 1 rep Numbers 1-15: Foam;1 rep Heel Raises: 15 reps Toe Raise: 10 reps Other Standing Exercises: hamstring curls 10 reps each; hip abd and ext 10 x each Other Standing Exercises: hip abd/ ext 15 x each  Seated Other Seated Exercises: Nustep level 3, 10'   Physical Therapy Assessment and Plan PT Assessment and Plan Clinical Impression Statement: No c/o dizziness throughout session.  Continued balance activites with most difficulty with tandem stance and gait.  VC-ing to improve spatial awareness and min assistance required for LOB episodes.  Added #1-15 to improve balance reaching outside BOS with min assistance.  PT Plan: Progress [place] balance program activities, next session add cone rotation and functional activities to improve ABC, Berg and TUG results.     Goals    Problem List Patient Active Problem List   Diagnosis Date Noted  . Muscle weakness (generalized) 10/19/2012  . Difficulty in walking 10/19/2012  . Long term (current) use of anticoagulants 08/14/2012  . Dysphagia 05/11/2012  . GERD  (gastroesophageal reflux disease) 05/11/2012  . Nausea alone 02/23/2011  . SOB (shortness of breath) on exertion 02/23/2011  . Lymphoma of lymph nodes of head, face, and/or neck 01/12/2011  . Atrial fibrillation 11/13/2008  . BRADYCARDIA 11/13/2008  . HYPERLIPIDEMIA 11/12/2008  . HYPERTENSION 11/12/2008  . SYNCOPE, HX OF 11/12/2008    PT - End of Session Equipment Utilized During Treatment: Gait belt Activity Tolerance: Patient tolerated treatment well General Behavior During Therapy: [facility] for tasks assessed/performed Cognition: WFL for tasks performed  GP    [name] 10/27/2012, 1:36 PM

## 2012-10-28 NOTE — Telephone Encounter (Signed)
Letter mailed

## 2012-11-01 ENCOUNTER — Encounter (HOSPITAL_COMMUNITY): Payer: Medicare Other | Attending: Oncology

## 2012-11-01 DIAGNOSIS — C8591 Non-Hodgkin lymphoma, unspecified, lymph nodes of head, face, and neck: Secondary | ICD-10-CM

## 2012-11-01 DIAGNOSIS — Z452 Encounter for adjustment and management of vascular access device: Secondary | ICD-10-CM

## 2012-11-01 DIAGNOSIS — C8581 Other specified types of non-Hodgkin lymphoma, lymph nodes of head, face, and neck: Secondary | ICD-10-CM

## 2012-11-01 MED ORDER — HEPARIN SOD (PORK) LOCK FLUSH 100 UNIT/ML IV SOLN
INTRAVENOUS | Status: AC
  Filled 2012-11-01: qty 5

## 2012-11-01 MED ORDER — HEPARIN SOD (PORK) LOCK FLUSH 100 UNIT/ML IV SOLN
500.0000 [IU] | Freq: Once | INTRAVENOUS | Status: AC
  Administered 2012-11-01: 500 [IU] via INTRAVENOUS
  Filled 2012-11-01: qty 5

## 2012-11-01 MED ORDER — SODIUM CHLORIDE 0.9 % IJ SOLN
10.0000 mL | INTRAMUSCULAR | Status: DC | PRN
  Administered 2012-11-01: 10 mL via INTRAVENOUS
  Filled 2012-11-01: qty 10

## 2012-11-01 NOTE — Progress Notes (Signed)
Sierra Good presented for Portacath access and flush. Proper placement of portacath confirmed by CXR. Portacath located left chest wall accessed with  H 20 needle. Good blood return present. Portacath flushed with 20ml NS and 500U/57ml Heparin and needle removed intact. Procedure without incident. Patient tolerated procedure well.

## 2012-11-03 ENCOUNTER — Ambulatory Visit (HOSPITAL_COMMUNITY)
Admission: RE | Admit: 2012-11-03 | Discharge: 2012-11-03 | Disposition: A | Discharge status: Home / Self Care | Payer: Medicare Other | Source: Ambulatory Visit | Attending: Oncology | Admitting: Oncology

## 2012-11-03 DIAGNOSIS — M6281 Muscle weakness (generalized): Secondary | ICD-10-CM

## 2012-11-03 DIAGNOSIS — R262 Difficulty in walking, not elsewhere classified: Secondary | ICD-10-CM

## 2012-11-03 NOTE — Progress Notes (Signed)
Physical Therapy Treatment Patient Details  Name: Sierra Good MRN: [id number] Date of Birth: 11-Mar-1923  Today's Date: 11/03/2012 Time: 1100-1155 PT Time Calculation (min): 55 min  Visit#: 4 of 8  Re-eval: 11/18/12    Authorization: [facility] Medicare  Charge:  There ex 11:00-11:25; neuro re-ed 11:28-11:54 Authorization Visit#: 4 of 10   Subjective: Symptoms/Limitations Symptoms: Pt was a little sore after last treatment.  I try to do the exercises.   Pain Assessment Currently in Pain?: No/denies  Exercise/Treatments      Balance Exercises Standing Gait with Head Turns (Round Trips):  (gt with walker working on heel toe and shoulders back x 60 f) Tandem Gait: Forward;1 rep Retro Gait: 1 rep Sidestepping: 1 rep Cone Rotation: Foam Marching: Solid surface;10 reps Heel Raises: 15 reps Toe Raise: 15 reps Other Standing Exercises: lateral and forward step [place] x 10 B Other Standing Exercises: hip abduction/extension with 3# x10     Seated Other Seated Exercises: Nustep Level 3 x 7:00       Physical Therapy Assessment and Plan PT Assessment and Plan Clinical Impression Statement: Pt has multiple LOB with tandem gt.   Able to complete step ups but needed B UE assist to complete.  Pt  worked on gt keeping shouders back and heel toe gt as pt tends to walk flat footed PT Plan: add 1-15 back into prgram time limited today.  Begin sit to stand activity    Goals  Progressing  Problem List Patient Active Problem List   Diagnosis Date Noted  . Muscle weakness (generalized) 10/19/2012  . Difficulty in walking(719.7) 10/19/2012  . Long term (current) use of anticoagulants 08/14/2012  . Dysphagia 05/11/2012  . GERD (gastroesophageal reflux disease) 05/11/2012  . Nausea alone 02/23/2011  . SOB (shortness of breath) on exertion 02/23/2011  . Lymphoma of lymph nodes of head, face, and/or neck 01/12/2011  . Atrial fibrillation 11/13/2008  . BRADYCARDIA 11/13/2008  . HYPERLIPIDEMIA  11/12/2008  . HYPERTENSION 11/12/2008  . SYNCOPE, HX OF 11/12/2008    PT - End of Session Equipment Utilized During Treatment: Gait belt Activity Tolerance: Patient tolerated treatment well General Behavior During Therapy: [facility] for tasks assessed/performed Cognition: WFL for tasks performed  GP    RUSSELL,CINDY 11/03/2012, 11:48 AM

## 2012-11-07 ENCOUNTER — Ambulatory Visit (HOSPITAL_COMMUNITY)
Admission: RE | Admit: 2012-11-07 | Discharge: 2012-11-07 | Disposition: A | Discharge status: Home / Self Care | Payer: Medicare Other | Source: Ambulatory Visit | Attending: Oncology | Admitting: Oncology

## 2012-11-07 DIAGNOSIS — IMO0001 Reserved for inherently not codable concepts without codable children: Secondary | ICD-10-CM | POA: Insufficient documentation

## 2012-11-07 DIAGNOSIS — R262 Difficulty in walking, not elsewhere classified: Secondary | ICD-10-CM

## 2012-11-07 DIAGNOSIS — M6281 Muscle weakness (generalized): Secondary | ICD-10-CM | POA: Insufficient documentation

## 2012-11-07 NOTE — Progress Notes (Signed)
Physical Therapy Treatment Patient Details  Name: Sierra Good MRN: [id number] Date of Birth: 1922-12-12  Today's Date: 11/07/2012 Time: 5784-6962 PT Time Calculation (min): 62 min Charge   Gait x 12' [phone]), NMR 27' [phone]), TE 20 [phone])  Visit#: 5 of 8  Re-eval: 11/18/12 Assessment Diagnosis: Generalized weakness Next MD Visit: Dr. Mariel Sleet - August 28th  Authorization: [facility] Medicare  Authorization Time Period:    Authorization Visit#: 5 of 10   Subjective: Symptoms/Limitations Symptoms: Pt stated she was low on energy today, no pain. Pain Assessment Currently in Pain?: No/denies  Precautions/Restrictions  Precautions Precaution Comments: Hx of Cancer  Exercise/Treatments Balance Exercises Standing Tandem Stance: 3 reps;30 secs;Eyes open;Limitations Tandem Stance Limitations: Lt LE on 4in step Standing, One Foot on a Step: Eyes open;4 inch;Limitations;3 reps;30 secs Standing, One Foot on a Step Limitations: min-mod A, RLE Gait with Head Turns (Round Trips): Gait training 3x 130ft with cueing for heel to toe pattern, posture and to increase stride length to reduce shuffling (Gait training 3x 140ft with cueing for heel to toe pattern, ) Tandem Gait: Forward;1 rep Retro Gait: 1 rep Sidestepping: 1 rep Numbers 1-15: Foam;1 rep Cone Rotation: Foam;R/L Marching: Solid surface;10 reps Heel Raises: 15 reps Toe Raise: 15 reps Sit to Stand: Elevated surface;Limitations Sit to Stand Limitations: 5x 20 in. surface no UE A w/min A Other Standing Exercises: lateral and forward step up x 10, 4 inc.  BLE Other Standing Exercises: Toe Tapping 10 reps 4 inc.   Seated Other Seated Exercises: Nustep hill Level 3 x 10 minutes   Physical Therapy Assessment and Plan PT Assessment and Plan Clinical Impression Statement: Pt with multiple LOB with tandem and retro gait, min assistance and vc-ing to improve spatial awareness with less assistance required with balance  activities.  Gait training with no AD with cueing for heel to toe pattern to reduce flat footed, posture and to increase stride length for more normalized gait mechanics.  Began sit to stand from 20 in mat with no HHA for LE functional strengthening, vc-ing to improve techniques with less assistance required following cues. PT Plan: Continue with current POC with less UE assistance with standing balance activities to improve balance and confidence with activities.    Goals    Problem List Patient Active Problem List   Diagnosis Date Noted  . Muscle weakness (generalized) 10/19/2012  . Difficulty in walking(719.7) 10/19/2012  . Long term (current) use of anticoagulants 08/14/2012  . Dysphagia 05/11/2012  . GERD (gastroesophageal reflux disease) 05/11/2012  . Nausea alone 02/23/2011  . SOB (shortness of breath) on exertion 02/23/2011  . Lymphoma of lymph nodes of head, face, and/or neck 01/12/2011  . Atrial fibrillation 11/13/2008  . BRADYCARDIA 11/13/2008  . HYPERLIPIDEMIA 11/12/2008  . HYPERTENSION 11/12/2008  . SYNCOPE, HX OF 11/12/2008    PT - End of Session Equipment Utilized During Treatment: Gait belt Activity Tolerance: Patient tolerated treatment well;Patient limited by fatigue General Behavior During Therapy: [facility] for tasks assessed/performed Cognition: WFL for tasks performed  GP    [name] 11/07/2012, 12:08 PM

## 2012-11-09 ENCOUNTER — Other Ambulatory Visit: Payer: Self-pay | Admitting: Pharmacist Clinician (PhC)/ Clinical Pharmacy Specialist

## 2012-11-09 DIAGNOSIS — C8591 Non-Hodgkin lymphoma, unspecified, lymph nodes of head, face, and neck: Secondary | ICD-10-CM

## 2012-11-09 MED ORDER — WARFARIN SODIUM 4 MG PO TABS: 4.0000 mg | ORAL_TABLET | Freq: Every day | ORAL | Status: DC

## 2012-11-10 ENCOUNTER — Ambulatory Visit (HOSPITAL_COMMUNITY)
Admission: RE | Admit: 2012-11-10 | Discharge: 2012-11-10 | Disposition: A | Discharge status: Home / Self Care | Payer: Medicare Other | Source: Ambulatory Visit | Attending: Oncology | Admitting: Oncology

## 2012-11-10 DIAGNOSIS — M6281 Muscle weakness (generalized): Secondary | ICD-10-CM

## 2012-11-10 DIAGNOSIS — R262 Difficulty in walking, not elsewhere classified: Secondary | ICD-10-CM

## 2012-11-10 NOTE — Progress Notes (Signed)
Physical Therapy Treatment Patient Details  Name: Sierra Good MRN: [id number] Date of Birth: August 22, 1922  Today's Date: 11/10/2012 Time: 1108-1205 PT Time Calculation (min): 57 min Charge: NMR 27' [phone]), TE 30 (1132-1205) Visit#: 6 of 8  Re-eval: 11/18/12    Authorization: [employer] Medicare  Authorization Time Period:    Authorization Visit#: 6 of 10   Subjective: Symptoms/Limitations Symptoms:  Pt reported everything continues to be difficult, has been compliant with HEP just cant get any energy.   No current pain. reports LBP when she first gets out of bed Pain Assessment Currently in Pain?: No/denies  Objective:   Exercise/Treatments Stretches Hip Flexor Stretch: 2 reps;30 seconds;Limitations Hip Flexor Stretch Limitations: standing Aerobic Stationary Bike: Nustep hill level #3 resistance level 3 SPM goal >80 Standing Heel Raises: 15 reps Lateral Step Up: Both;10 reps;Hand Hold: 1;Step Height: 4" Forward Step Up: Both;10 reps;Hand Hold: 1;Step Height: 4" Other Standing Knee Exercises: Tandem, retro and side stepping 2RT; toe tapping 20x 4 in step Other Standing Knee Exercises: Cone rotation R/L     Physical Therapy Assessment and Plan PT Assessment and Plan Clinical Impression Statement: Pt with improving spatial awareness and improving activity tolerance though does continue to be limited by fatigue.  Mutiple LOB episodes with tandem and retro gait, able to regain balance with less assistance required.  Activity tolerance improviing, able to complete >80 SPM on Nustep.   PT Plan: Continue with current POC with less UE assistance with standing balance activities to improve balance and confidence with activities.    Goals    Problem List Patient Active Problem List   Diagnosis Date Noted  . Muscle weakness (generalized) 10/19/2012  . Difficulty in walking(719.7) 10/19/2012  . Long term (current) use of anticoagulants 08/14/2012  . Dysphagia 05/11/2012  .  GERD (gastroesophageal reflux disease) 05/11/2012  . Nausea alone 02/23/2011  . SOB (shortness of breath) on exertion 02/23/2011  . Lymphoma of lymph nodes of head, face, and/or neck 01/12/2011  . Atrial fibrillation 11/13/2008  . BRADYCARDIA 11/13/2008  . HYPERLIPIDEMIA 11/12/2008  . HYPERTENSION 11/12/2008  . SYNCOPE, HX OF 11/12/2008    PT - End of Session Equipment Utilized During Treatment: Gait belt Activity Tolerance: Patient tolerated treatment well;Patient limited by fatigue General Behavior During Therapy: [facility] for tasks assessed/performed Cognition: WFL for tasks performed  GP    [name] 11/10/2012, 1:32 PM

## 2012-11-14 ENCOUNTER — Ambulatory Visit (HOSPITAL_COMMUNITY)
Admission: RE | Admit: 2012-11-14 | Discharge: 2012-11-14 | Disposition: A | Discharge status: Home / Self Care | Payer: Medicare Other | Source: Ambulatory Visit | Attending: Oncology | Admitting: Oncology

## 2012-11-14 DIAGNOSIS — M6281 Muscle weakness (generalized): Secondary | ICD-10-CM

## 2012-11-14 DIAGNOSIS — R262 Difficulty in walking, not elsewhere classified: Secondary | ICD-10-CM

## 2012-11-14 NOTE — Evaluation (Cosign Needed)
Physical Therapy Re-Evaluation  Patient Details  Name: Sierra Good MRN: [id number] Date of Birth: [birth year]  Today's Date: 11/14/2012 Time: 4098-1191 PT Time Calculation (min): 38 min Charges: MMT: 1104-1106 PPT: 4782-9562 Self Care: 1308-6578             Visit#: 7 of 11  Re-eval: 11/28/12 Assessment Diagnosis: Generalized weakness Next MD Visit: Dr. Mariel Sleet - August 28th  Authorization: [facility] Medicare    Authorization Time Period:    Authorization Visit#: 7 of 10   Subjective Symptoms/Limitations Symptoms: Pt states she has been feeling better overall, but this weekend she had a lot of people over and was worn out.  Reports that her son asked her wear her walker was and she reported "it walked away."  Pt caretaker states she continues to encourage her to walk correctly and to get up on her own.  How long can you walk comfortably?: with rollator  Pain Assessment Currently in Pain?: No/denies  Precautions/Restrictions  Precautions Precaution Comments: Hx of Cancer  Sensation/Coordination/Flexibility/Functional Tests Functional Tests Functional Tests: Activity-specific Balance Confidence Scale (ABC)  (was 8%)  RLE Strength Right Hip Flexion: 3+/5 (was 3/5) Right Hip ABduction: 3+/5 (was 3/5) Right Hip ADduction: 4/5 (was 4/5) Right Knee Flexion:  (4+/5 was 3+/5) Right Knee Extension:  (4+/5 was 3+/5) LLE Strength LLE Overall Strength Comments: taken in sitting position Left Hip Flexion: 3+/5 (was 3/5) Left Hip ABduction: 3+/5 (was 3+/5) Left Hip ADduction: 4/5 (was 4/5) Left Knee Flexion: 5/5 (was 5/5) Left Knee Extension:  (4+/5was 4/5)  Exercise/Treatments Mobility/Balance  Transfers Transfers: Sit to Stand;Stand to Sit Sit to Stand: 4: Min assist;Without upper extremity assist;From chair/3-in-1 (was mod A w/UE assist) Stand to Sit: 6: Modified independent (Device/Increase time);Without upper extremity assist;To  chair/3-in-1 Ambulation/Gait Ambulation/Gait: Yes Ambulation Distance (Feet): 205 Feet (2 min (was 120 in 2 min)) Berg Balance Test Sit to Stand: Able to stand  independently using hands Standing Unsupported: Able to stand safely 2 minutes Sitting with Back Unsupported but Feet Supported on Floor or Stool: Able to sit safely and securely 2 minutes Stand to Sit: Sits safely with minimal use of hands Transfers: Able to transfer safely, minor use of hands Standing Unsupported with Eyes Closed: Able to stand 10 seconds safely Standing Ubsupported with Feet Together: Able to place feet together independently and stand 1 minute safely From Standing, Reach Forward with Outstretched Arm: Can reach confidently >25 cm (10") From Standing Position, Pick up Object from Floor: Able to pick up shoe safely and easily From Standing Position, Turn to Look Behind Over each Shoulder: Looks behind from both sides and weight shifts well Turn 360 Degrees: Able to turn 360 degrees safely one side only in 4 seconds or less Standing Unsupported, Alternately Place Feet on Step/Stool: Able to complete >2 steps/needs minimal assist Standing Unsupported, One Foot in Front: Able to take small step independently and hold 30 seconds Standing on One Leg: Tries to lift leg/unable to hold 3 seconds but remains standing independently Total Score: 46 (was 28/56) Timed Up and Go Test TUG: Normal TUG Normal TUG (seconds): 18 (w/Rollator (was 38 w/RW))   Physical Therapy Assessment and Plan PT Assessment and Plan Clinical Impression Statement: Ms. Scalese has attended 7 OP PT visits in 4 weeks with the following findings: improved gait speed, dynamic balance, static balance and body awareness, made slight gains in overall LE strength.  Continues to be limited by activity tolerance and percieved functionable ability.  PT Frequency: 2x/week PT Duration:  2 weeks PT Plan: COntinue with focus on improving LE strength and continue  with high level balance activities.     Goals Home Exercise Program Pt will Perform Home Exercise Program: Independently (a few everyday) PT Goal: Perform Home Exercise Program - Progress: Progressing toward goal PT Short Term Goals Time to Complete Short Term Goals: 3 weeks PT Short Term Goal 1: Pt will improve her LE strength by 1 muscle grade for greater ease with sit to stand. PT Short Term Goal 2: Pt will improve her Berg Balance Test to 45/56 in order to decrease risk of fall. (46/56) PT Short Term Goal 2 - Progress: Met PT Short Term Goal 3: Pt will improved dynamic balance and ambulate with modified independence with RW or rollator in indoor envirnmoment. PT Short Term Goal 3 - Progress: Met PT Long Term Goals Time to Complete Long Term Goals:  (6 weeks) PT Long Term Goal 1: Pt will improver her activity tolerance and ambulate with LRAD x30 minutes in indoor and outdoor environment in order to continue with shopping and lesiure activities.  (7 minutes) PT Long Term Goal 1 - Progress: Progressing toward goal PT Long Term Goal 2: Pt will improve her ABC to greater than 40% for improved percieved functional ability.  PT Long Term Goal 2 - Progress: Progressing toward goal Long Term Goal 3: Pt will improve her TUG time to less than 20 seconds with LRAD for improved gait speed and safety in the community.  (18 seconds) Long Term Goal 3 Progress: Met Long Term Goal 4: Pt will improve mobility and ambulate 429 feet in 2 minutes for appropriate age.  (205 in 2 minutes) Long Term Goal 4 Progress: Progressing toward goal  Problem List Patient Active Problem List   Diagnosis Date Noted  . Muscle weakness (generalized) 10/19/2012  . Difficulty in walking(719.7) 10/19/2012  . Long term (current) use of anticoagulants 08/14/2012  . Dysphagia 05/11/2012  . GERD (gastroesophageal reflux disease) 05/11/2012  . Nausea alone 02/23/2011  . SOB (shortness of breath) on exertion 02/23/2011  .  Lymphoma of lymph nodes of head, face, and/or neck 01/12/2011  . Atrial fibrillation 11/13/2008  . BRADYCARDIA 11/13/2008  . HYPERLIPIDEMIA 11/12/2008  . HYPERTENSION 11/12/2008  . SYNCOPE, HX OF 11/12/2008    PT - End of Session Equipment Utilized During Treatment: Gait belt Activity Tolerance: Patient tolerated treatment well;Patient limited by fatigue General Behavior During Therapy: WFL for tasks assessed/performed Cognition: WFL for tasks performed PT Plan of Care PT Patient Instructions: discussed importance of continuing HEP to strengthen LE, discussed goals, future POC. discussed ABC Consulted and Agree with Plan of Care: Patient  GP    Annett Fabian, MPT ATC 11/14/2012, 11:47 AM  Physician Documentation Your signature is required to indicate approval of the treatment plan as stated above.  Please sign and either send electronically or make a copy of this report for your files and return this physician signed original.   Please mark one 1.__approve of plan  2. ___approve of plan with the following conditions.   ______________________________                                                          _____________________ Physician Signature  Date  

## 2012-11-17 ENCOUNTER — Ambulatory Visit (HOSPITAL_COMMUNITY)
Admission: RE | Admit: 2012-11-17 | Discharge: 2012-11-17 | Disposition: A | Discharge status: Home / Self Care | Payer: Medicare Other | Source: Ambulatory Visit | Attending: Oncology | Admitting: Oncology

## 2012-11-17 NOTE — Progress Notes (Signed)
Physical Therapy Treatment Patient Details  Name: Sierra Good MRN: [id number] Date of Birth: 1922-10-16  Today's Date: 11/17/2012 Time: 9629-5284 PT Time Calculation (min): 47 min  Visit#: 8 of 12  Re-eval: 11/28/12 Authorization: [facility] Medicare  Authorization Visit#: 8 of 10  Charges:  Therex 1059-1116 (17'), NMR 1118-1145 (27')  Subjective: Symptoms/Limitations Symptoms: Pt states she is not feeling too good today; reports no real pain just has no energy. Pain Assessment Currently in Pain?: No/denies   Exercise/Treatments Balance Exercises Standing Tandem Gait: Forward;2 reps Retro Gait: 2 reps Sidestepping: 2 reps Turning: Limitations Turning Limitations: high march walk 1RT Heel Raises: 15 reps Toe Raise: 15 reps Sit to Stand: Elevated surface;Limitations Sit to Stand Limitations: 5x 20 in. surface no UE A w/min A Other Standing Exercises: lateral and forward step up x 10, 4 inc.  BLE Other Standing Exercises: Toe Tapping 10 reps 4 inc.      Physical Therapy Assessment and Plan PT Assessment and Plan Clinical Impression Statement: Pt required 4 seated rest breaks today (under 1 minute) due to fatigue.  Able to complete toe tapping and step ups without UE assist (min assist of therapist) and added high marching to POC.  Pt. requires constant vc's with ambulation to increase stride and heelstrike. PT Plan: COntinue with focus on improving LE strength and continue with high level balance activities X 4 more visits per PT POC.     Problem List Patient Active Problem List   Diagnosis Date Noted  . Muscle weakness (generalized) 10/19/2012  . Difficulty in walking(719.7) 10/19/2012  . Long term (current) use of anticoagulants 08/14/2012  . Dysphagia 05/11/2012  . GERD (gastroesophageal reflux disease) 05/11/2012  . Nausea alone 02/23/2011  . SOB (shortness of breath) on exertion 02/23/2011  . Lymphoma of lymph nodes of head, face, and/or neck 01/12/2011  . Atrial  fibrillation 11/13/2008  . BRADYCARDIA 11/13/2008  . HYPERLIPIDEMIA 11/12/2008  . HYPERTENSION 11/12/2008  . SYNCOPE, HX OF 11/12/2008    PT - End of Session Equipment Utilized During Treatment: Gait belt Activity Tolerance: Patient tolerated treatment well;Patient limited by fatigue General Behavior During Therapy: WFL for tasks assessed/performed Cognition: WFL for tasks performed PT Plan of Care PT Patient Instructions: discussed importance of continuing HEP to strengthen LE, discussed goals, future POC. discussed ABC Consulted and Agree with Plan of Care: Patient   Lurena Nida, PTA/CLT 11/17/2012, 12:27 PM

## 2012-11-21 ENCOUNTER — Ambulatory Visit (HOSPITAL_COMMUNITY)
Admission: RE | Admit: 2012-11-21 | Discharge: 2012-11-21 | Disposition: A | Discharge status: Home / Self Care | Payer: Medicare Other | Source: Ambulatory Visit | Attending: Oncology | Admitting: Oncology

## 2012-11-21 NOTE — Progress Notes (Signed)
Physical Therapy Treatment Patient Details  Name: Sierra Good MRN: [id number] Date of Birth: Oct 09, 1922  Today's Date: 11/21/2012 Time: 1914-7829 PT Time Calculation (min): 40 min  Visit#: 9 of 12  Re-eval: 11/28/12 Assessment Diagnosis: Generalized weakness Next MD Visit: Dr. Mariel Sleet - August 28th  Authorization: [facility] Medicare  Authorization Visit#: 9 of 10  Charges: Therex x 10' NMR x 28'  Subjective: Symptoms/Limitations Symptoms: Pt states that she feels tired all the time. Pain Assessment Currently in Pain?: No/denies  Precautions/Restrictions  Precautions Precaution Comments: Hx of Cancer  Exercise/Treatments Balance Exercises Standing Tandem Gait: 1 rep;Forward Retro Gait: 1 rep Sidestepping: 1 rep Heel Raises: 15 reps Toe Raise: 15 reps  Seated Other Seated Exercises: Nustep hills #3 Level 3 x 10 minutes to improve activity tolerance  Physical Therapy Assessment and Plan PT Assessment and Plan Clinical Impression Statement: Pt's activity tolerance is very low this session. Pt unable to complete 1RT of tandem, retro, and sidestepping. Pt is usually able to complete 2RT of each. Discussed changing plan to 1 session a week. Pt feels this would help her energy. Pt also states that her doctor recently changed her cholesterol medicine. This may be attributing to pt's low energy. Advised pt to contact MD about this. PT Plan: Recommend changing plan to 1 session per week.    Goals Home Exercise Program Pt will Perform Home Exercise Program: Independently (a few everyday) PT Short Term Goals Time to Complete Short Term Goals: 3 weeks PT Short Term Goal 1: Pt will improve her LE strength by 1 muscle grade for greater ease with sit to stand. PT Short Term Goal 1 - Progress: Progressing toward goal PT Short Term Goal 2: Pt will improve her Berg Balance Test to 45/56 in order to decrease risk of fall. (46/56) PT Short Term Goal 2 - Progress: Met PT Short Term Goal  3: Pt will improved dynamic balance and ambulate with modified independence with RW or rollator in indoor envirnmoment. PT Short Term Goal 3 - Progress: Met PT Long Term Goals Time to Complete Long Term Goals:  (6 weeks) PT Long Term Goal 1: Pt will improver her activity tolerance and ambulate with LRAD x30 minutes in indoor and outdoor environment in order to continue with shopping and lesiure activities.  (7 minutes) PT Long Term Goal 1 - Progress: Progressing toward goal PT Long Term Goal 2: Pt will improve her ABC to greater than 40% for improved percieved functional ability.  PT Long Term Goal 2 - Progress: Progressing toward goal Long Term Goal 3: Pt will improve her TUG time to less than 20 seconds with LRAD for improved gait speed and safety in the community.  (18 seconds) Long Term Goal 3 Progress: Met Long Term Goal 4: Pt will improve mobility and ambulate 429 feet in 2 minutes for appropriate age.  (205 in 2 minutes) Long Term Goal 4 Progress: Progressing toward goal  Problem List Patient Active Problem List   Diagnosis Date Noted  . Muscle weakness (generalized) 10/19/2012  . Difficulty in walking(719.7) 10/19/2012  . Long term (current) use of anticoagulants 08/14/2012  . Dysphagia 05/11/2012  . GERD (gastroesophageal reflux disease) 05/11/2012  . Nausea alone 02/23/2011  . SOB (shortness of breath) on exertion 02/23/2011  . Lymphoma of lymph nodes of head, face, and/or neck 01/12/2011  . Atrial fibrillation 11/13/2008  . BRADYCARDIA 11/13/2008  . HYPERLIPIDEMIA 11/12/2008  . HYPERTENSION 11/12/2008  . SYNCOPE, HX OF 11/12/2008    PT - End  of Session Equipment Utilized During Treatment: Gait belt Activity Tolerance: Patient tolerated treatment well;Patient limited by fatigue General Behavior During Therapy: [facility] for tasks assessed/performed Cognition: WFL for tasks performed  Seth Bake, PTA  11/21/2012, 12:53 PM

## 2012-11-24 ENCOUNTER — Ambulatory Visit (HOSPITAL_COMMUNITY): Payer: Self-pay | Admitting: *Deleted

## 2012-11-28 ENCOUNTER — Ambulatory Visit (HOSPITAL_COMMUNITY)
Admission: RE | Admit: 2012-11-28 | Discharge: 2012-11-28 | Disposition: A | Discharge status: Home / Self Care | Payer: Medicare Other | Source: Ambulatory Visit | Attending: Oncology | Admitting: Oncology

## 2012-11-28 NOTE — Evaluation (Cosign Needed Addendum)
Physical Therapy Discharge [place]  Patient Details  Name: Sierra Good MRN: [id number] Date of Birth: [birth year]  Today's Date: 11/28/2012 Time: 1002-1030 PT Time Calculation (min): 28 min Charges: NMR x 18' Self care x 8'             Visit#: 10 of 12  Re-eval: 11/28/12 Assessment Diagnosis: Generalized weakness Next MD Visit: Dr. Mariel Sleet - August 28th  Authorization: [facility] Medicare    Authorization Visit#: 10 of 10   Subjective Symptoms/Limitations Symptoms: Pt states that she still feels very tired. She plans to see MD this week about this. Pain Assessment Currently in Pain?: No/denies  Precautions/Restrictions  Precautions Precaution Comments: Hx of Cancer  Sensation/Coordination/Flexibility/Functional Tests Functional Tests Functional Tests: Activity-specific Balance Confidence Scale (ABC) 48% (was 8%)  Assessment RLE Strength Right Hip Flexion: 3+/5 (was 3/5) Right Hip ABduction: 3+/5 (was 3/5) Right Hip ADduction: 4/5 (was 4/5) Right Knee Flexion:  (4+/5 was 3+/5) Right Knee Extension:  (4+/5 was 3+/5) LLE Strength LLE Overall Strength Comments: taken in sitting position Left Hip Flexion: 3+/5 (was 3/5) Left Hip ABduction: 3+/5 (was 3+/5) Left Hip ADduction: 4/5 (was 4/5) Left Knee Flexion: 5/5 (was 5/5) Left Knee Extension:  (4+/5was 4/5)  Exercise/Treatments Mobility/Balance  Transfers Transfers: Sit to Stand;Stand to Sit Sit to Stand: 4: Min assist;Without upper extremity assist;From chair/3-in-1 (was mod A w/UE assist) Stand to Sit: 6: Modified independent (Device/Increase time);Without upper extremity assist;To chair/3-in-1 Ambulation/Gait Ambulation/Gait: Yes Berg Balance Test Sit to Stand: Able to stand  independently using hands Standing Unsupported: Able to stand safely 2 minutes Sitting with Back Unsupported but Feet Supported on Floor or Stool: Able to sit safely and securely 2 minutes Stand to Sit: Sits safely with minimal use of  hands Transfers: Able to transfer safely, minor use of hands Standing Unsupported with Eyes Closed: Able to stand 10 seconds safely Standing Ubsupported with Feet Together: Able to place feet together independently and stand 1 minute safely From Standing, Reach Forward with Outstretched Arm: Can reach confidently >25 cm (10") From Standing Position, Pick up Object from Floor: Able to pick up shoe safely and easily From Standing Position, Turn to Look Behind Over each Shoulder: Looks behind from both sides and weight shifts well Turn 360 Degrees: Able to turn 360 degrees safely one side only in 4 seconds or less Standing Unsupported, Alternately Place Feet on Step/Stool: Able to complete >2 steps/needs minimal assist Standing Unsupported, One Foot in Front: Able to take small step independently and hold 30 seconds Standing on One Leg: Tries to lift leg/unable to hold 3 seconds but remains standing independently Total Score: 46 Timed Up and Go Test TUG: Normal TUG Normal TUG (seconds): 18 (w/Rollator (was 38 w/RW))   Balance Exercises Standing Tandem Gait: 2 reps Retro Gait: 2 reps Heel Raises: 15 reps Toe Raise: 15 reps Other Standing Exercises: Tall marching without UE assistance x 10   Physical Therapy Assessment and Plan PT Assessment and Plan Clinical Impression Statement: Pt displays improved activity tolerance this session but requests to be discharged from PT. ABC score has improved 40%. Pt has progressed well towards goals. Pt displays improved stability and confidence with balance activities. PT Plan: D/C per pt request.     Problem List Patient Active Problem List   Diagnosis Date Noted  . Muscle weakness (generalized) 10/19/2012  . Difficulty in walking(719.7) 10/19/2012  . Long term (current) use of anticoagulants 08/14/2012  . Dysphagia 05/11/2012  . GERD (gastroesophageal reflux disease) 05/11/2012  .  Nausea alone 02/23/2011  . SOB (shortness of breath) on exertion  02/23/2011  . Lymphoma of lymph nodes of head, face, and/or neck 01/12/2011  . Atrial fibrillation 11/13/2008  . BRADYCARDIA 11/13/2008  . HYPERLIPIDEMIA 11/12/2008  . HYPERTENSION 11/12/2008  . SYNCOPE, HX OF 11/12/2008    PT - End of Session Equipment Utilized During Treatment: Gait belt Activity Tolerance: Patient tolerated treatment well;Patient limited by fatigue General Behavior During Therapy: WFL for tasks assessed/performed Cognition: WFL for tasks performed  GP Functional Assessment Tool Used: ABC: 48% and clinical observation  Functional Limitation: Mobility: Walking and moving around Mobility: Walking and Moving Around Goal Status [phone]): At least 20 percent but less than 40 percent impaired, limited or restricted Mobility: Walking and Moving Around Discharge Status [phone]): At least 40 percent but less than 60 percent impaired, limited or restricted  Seth Bake, PTA; Annett Fabian, MPT ATC  11/28/2012, 1:37 PM

## 2012-11-30 ENCOUNTER — Ambulatory Visit (INDEPENDENT_AMBULATORY_CARE_PROVIDER_SITE_OTHER): Payer: Medicare Other | Admitting: Gastroenterology

## 2012-11-30 ENCOUNTER — Encounter: Payer: Self-pay | Admitting: Gastroenterology

## 2012-11-30 VITALS — BP 135/72 | HR 65 | Temp 98.2°F | Ht 66.0 in | Wt 139.8 lb

## 2012-11-30 DIAGNOSIS — K219 Gastro-esophageal reflux disease without esophagitis: Secondary | ICD-10-CM

## 2012-11-30 NOTE — Progress Notes (Signed)
Cc PCP 

## 2012-11-30 NOTE — Patient Instructions (Addendum)
1. Continue pantoprazole 40 mg daily before breakfast. 2. Please call us if you have any issues with your heartburn not being controlled, recurrent difficulty swallowing. 3. Office visit in one year or sooner if needed.

## 2012-11-30 NOTE — Assessment & Plan Note (Signed)
Doing well on pantoprazole once daily. Office visit in one year or sooner if needed.

## 2012-11-30 NOTE — Progress Notes (Signed)
Primary Care Physician: [name], MD  Primary Gastroenterologist:  Roetta Sessions, MD   Chief Complaint  Patient presents with  . Follow-up    HPI: Sierra Good is a 79 y.o. female here for followup of GERD. She was last seen in April 2014. Last EGD in December 2013 at which time she had a Schatzki ring, hiatal hernia. She was dilated with a 54 [place] Maloney dilator. She had been doing well on Dexilant but her co-pay was too high at $95 per month, therefore she was switched to pantoprazole 40 mg daily. She denies any heartburn, dysphagia, abdominal pain, vomiting, anorexia, melena, rectal bleeding, constipation, diarrhea, weight loss.  Current Outpatient Prescriptions  Medication Sig Dispense Refill  . amLODipine (NORVASC) 5 MG tablet Take 2.5 mg by mouth daily.       . Calcium-Vitamin D (CALTRATE 600 PLUS-VIT D PO) Take 1 tablet by mouth daily.      . clobetasol cream (TEMOVATE) 0.05 % Apply 1 application topically 2 (two) times daily.       . Multiple Vitamins-Minerals (CENTRUM SILVER ULTRA WOMENS) TABS Take 1 tablet by mouth daily.        Marland Kitchen oxybutynin (DITROPAN) 5 MG tablet Take 5 mg by mouth 2 (two) times daily.      . pantoprazole (PROTONIX) 40 MG tablet Take 1 tablet (40 mg total) by mouth daily.  30 tablet  11  . warfarin (COUMADIN) 4 MG tablet Take 1 tablet (4 mg total) by mouth daily. 4 mg daily except 2 days she takes 1/2 tablet  30 tablet  4   No current facility-administered medications for this visit.   Facility-Administered Medications Ordered in Other Visits  Medication Dose Route Frequency Provider Last Rate Last Dose  . heparin lock flush 100 unit/mL  500 Units Intravenous Once Ellouise Newer, PA-C      . sodium chloride 0.9 % injection 10 mL  10 mL Intravenous PRN Ellouise Newer, PA-C        Allergies as of 11/30/2012 - Review Complete 11/30/2012  Allergen Reaction Noted  . Statins Other (See Comments) 12/12/2010    ROS:  General: Negative for  anorexia, weight loss, fever, chills, fatigue, weakness. ENT: Negative for hoarseness, difficulty swallowing , nasal congestion. CV: Negative for chest pain, angina, palpitations, dyspnea on exertion, peripheral edema.  Respiratory: Negative for dyspnea at rest, dyspnea on exertion, cough, sputum, wheezing.  GI: See history of present illness. GU:  Negative for dysuria, hematuria, urinary incontinence, urinary frequency, nocturnal urination.  Endo: Negative for unusual weight change.    Physical Examination:   BP 135/72  Pulse 65  Temp(Src) 98.2 F (36.8 C) (Oral)  Ht 5\' 6"  (1.676 m)  Wt 139 lb 12.8 oz (63.413 kg)  BMI 22.58 kg/m2  General: Well-nourished, well-developed in no acute distress.  Eyes: No icterus. Mouth: Oropharyngeal mucosa moist and pink , no lesions erythema or exudate. Lungs: Clear to auscultation bilaterally.  Heart: Regular rate and rhythm, no murmurs rubs or gallops.  Abdomen: Bowel sounds are normal, nontender, nondistended, no hepatosplenomegaly or masses, no abdominal bruits or hernia , no rebound or guarding.   Extremities: No lower extremity edema. No clubbing or deformities. Neuro: Alert and oriented x 4   Skin: Warm and dry, no jaundice.   Psych: Alert and cooperative, normal mood and affect.

## 2012-12-01 ENCOUNTER — Ambulatory Visit (HOSPITAL_COMMUNITY): Payer: Self-pay | Admitting: Physical Therapy

## 2012-12-05 ENCOUNTER — Ambulatory Visit (HOSPITAL_COMMUNITY): Payer: Self-pay | Admitting: *Deleted

## 2012-12-14 ENCOUNTER — Encounter (HOSPITAL_COMMUNITY): Payer: Medicare Other | Attending: Oncology

## 2012-12-14 DIAGNOSIS — C8591 Non-Hodgkin lymphoma, unspecified, lymph nodes of head, face, and neck: Secondary | ICD-10-CM

## 2012-12-14 DIAGNOSIS — C8581 Other specified types of non-Hodgkin lymphoma, lymph nodes of head, face, and neck: Secondary | ICD-10-CM | POA: Insufficient documentation

## 2012-12-14 LAB — CBC WITH DIFFERENTIAL/PLATELET
Eosinophils Relative: 1 % (ref 0–5)
Hemoglobin: 14.5 g/dL (ref 12.0–15.0)
Lymphocytes Relative: 18 % (ref 12–46)
Lymphs Abs: 1.5 10*3/uL (ref 0.7–4.0)
MCV: 95.7 fL (ref 78.0–100.0)
Monocytes Relative: 7 % (ref 3–12)
Neutrophils Relative %: 74 % (ref 43–77)
Platelets: 223 10*3/uL (ref 150–400)
RBC: 4.42 MIL/uL (ref 3.87–5.11)
WBC: 8.1 10*3/uL (ref 4.0–10.5)

## 2012-12-14 LAB — COMPREHENSIVE METABOLIC PANEL
ALT: 10 U/L (ref 0–35)
Alkaline Phosphatase: 75 U/L (ref 39–117)
CO2: 29 mEq/L (ref 19–32)
GFR calc Af Amer: 64 mL/min — ABNORMAL LOW (ref >90)
GFR calc non Af Amer: 55 mL/min — ABNORMAL LOW (ref >90)
Glucose, Bld: 102 mg/dL — ABNORMAL HIGH (ref 70–99)
Potassium: 3.9 mEq/L (ref 3.5–5.1)
Sodium: 142 mEq/L (ref 135–145)

## 2012-12-14 LAB — TSH: TSH: 1.525 u[IU]/mL (ref 0.350–4.500)

## 2012-12-14 MED ORDER — HEPARIN SOD (PORK) LOCK FLUSH 100 UNIT/ML IV SOLN
500.0000 [IU] | Freq: Once | INTRAVENOUS | Status: AC
  Administered 2012-12-14: 500 [IU] via INTRAVENOUS
  Filled 2012-12-14: qty 5

## 2012-12-14 MED ORDER — SODIUM CHLORIDE 0.9 % IJ SOLN
10.0000 mL | INTRAMUSCULAR | Status: DC | PRN
  Administered 2012-12-14: 10 mL via INTRAVENOUS
  Filled 2012-12-14: qty 10

## 2012-12-14 MED ORDER — HEPARIN SOD (PORK) LOCK FLUSH 100 UNIT/ML IV SOLN
INTRAVENOUS | Status: AC
  Filled 2012-12-14: qty 5

## 2012-12-14 NOTE — Progress Notes (Signed)
[place] presented for Portacath access and flush.  Proper placement of portacath confirmed by CXR.  Portacath located left chest wall accessed with  H 20 needle.  Good blood return present. Portacath flushed with 20ml NS and 500U/55ml Heparin and needle removed intact.  Procedure tolerated well and without incident.

## 2012-12-15 ENCOUNTER — Encounter: Payer: Self-pay | Admitting: *Deleted

## 2012-12-15 ENCOUNTER — Other Ambulatory Visit (HOSPITAL_COMMUNITY): Payer: Self-pay

## 2012-12-16 ENCOUNTER — Encounter: Payer: Self-pay | Admitting: Cardiovascular Disease

## 2012-12-16 ENCOUNTER — Encounter: Payer: Self-pay | Admitting: *Deleted

## 2012-12-16 ENCOUNTER — Ambulatory Visit (INDEPENDENT_AMBULATORY_CARE_PROVIDER_SITE_OTHER): Payer: Medicare Other | Admitting: Cardiovascular Disease

## 2012-12-16 VITALS — BP 158/71 | HR 56 | Ht 66.0 in | Wt 140.2 lb

## 2012-12-16 DIAGNOSIS — E7800 Pure hypercholesterolemia, unspecified: Secondary | ICD-10-CM

## 2012-12-16 DIAGNOSIS — I4891 Unspecified atrial fibrillation: Secondary | ICD-10-CM

## 2012-12-16 DIAGNOSIS — I495 Sick sinus syndrome: Secondary | ICD-10-CM

## 2012-12-16 DIAGNOSIS — R5383 Other fatigue: Secondary | ICD-10-CM

## 2012-12-16 DIAGNOSIS — I480 Paroxysmal atrial fibrillation: Secondary | ICD-10-CM

## 2012-12-16 DIAGNOSIS — I10 Essential (primary) hypertension: Secondary | ICD-10-CM

## 2012-12-16 DIAGNOSIS — R5381 Other malaise: Secondary | ICD-10-CM

## 2012-12-16 MED ORDER — AMLODIPINE BESYLATE 5 MG PO TABS: 5.0000 mg | ORAL_TABLET | Freq: Every day | ORAL | Status: DC

## 2012-12-16 NOTE — Progress Notes (Signed)
Patient ID: Sierra Good, female   DOB: 09-Dec-1922, 79 y.o.   MRN: [id number]    CARDIOLOGY CONSULT NOTE  Patient ID: Sierra Good MRN: [id number] DOB/AGE: 1923-05-10 79 y.o.  Admit date: (Not on file) Primary Physician: Ignatius Specking, MD Reason for Consultation: Atrial fibrillation, sinus node dysfunction  HPI:  Sierra Good is an 79 y.o. Woman with a PMH significant for paroxysmal atrial fibrillation (occurred during an illness) for which she takes Warfarin. She also has sinus node dysfunction with documented evidence of marked sinus bradycardia with a junctional escape rhythm. According to prior office visits, treatment with antiarrhythmic medications (Amiodarone) led to this, and for this reason, she has not been on any meds with a negative chronotropic effect.  She is with her daughter, Sierra Good. She feels very fatigued. She is unable to keep with daily activities. She's felt like this over the past week on a daily basis. She denies chest pain and shortness of breath.   Even after a full night's rest, she feels tired. Her daughter doesn't think she snores.   Review of systems complete and found to be negative unless listed above in HPI  Past Medical History: see HPI, mediastinal lymphoma s/p chemotherapy, HTN, hypercholesterolemia   Family History  Problem Relation Age of Onset  . Pseudochol deficiency Neg Hx   . Malignant hyperthermia Neg Hx   . Hypotension Neg Hx   . Anesthesia problems Neg Hx   . Cancer Mother   . Stroke Father     History   Social History  . Marital Status: Widowed    Spouse Name: N/A    Number of Children: N/A  . Years of Education: N/A   Occupational History  . Not on file.   Social History Main Topics  . Smoking status: Never Smoker   . Smokeless tobacco: Never Used  . Alcohol Use: No  . Drug Use: No  . Sexually Active: Yes    Birth Control/ Protection: Post-menopausal   Other Topics Concern  . Not on file   Social History  Narrative  . No narrative on file       Physical exam BP: 158/71 mmHg HR: 56 bpm  General: NAD Neck: No JVD, no thyromegaly or thyroid nodule.  Lungs: Clear to auscultation bilaterally with normal respiratory effort. CV: Nondisplaced PMI.  Heart regular S1/S2, no S3/S4, no murmur, slightly bradycardic (56 bpm).  No peripheral edema.  No carotid bruit.  Normal pedal pulses.  Abdomen: Soft, nontender, no hepatosplenomegaly, no distention.  Skin: Intact without lesions or rashes.  Neurologic: Alert and oriented x 3.  Psych: Normal affect. Extremities: No clubbing or cyanosis.  HEENT: Normal.   Labs:   Lab Results  Component Value Date   WBC 8.1 12/14/2012   HGB 14.5 12/14/2012   HCT 42.3 12/14/2012   MCV 95.7 12/14/2012   PLT 223 12/14/2012    Recent Labs Lab 12/14/12 1119  NA 142  K 3.9  CL 105  CO2 29  BUN 17  CREATININE 0.90  CALCIUM 9.9  PROT 7.0  BILITOT 0.4  ALKPHOS 75  ALT 10  AST 19  GLUCOSE 102*   Lab Results  Component Value Date   CKTOTAL 24 02/24/2011   CKMB 2.3 02/24/2011   TROPONINI <0.30 02/24/2011    No results found for this basename: CHOL   No results found for this basename: HDL   No results found for this basename: LDLCALC   No results found for  this basename: TRIG   No results found for this basename: CHOLHDL   No results found for this basename: LDLDIRECT     TSH also within normal limits on July 9.   Echo (12/30/2011): Procedure narrative: Limited study (20 images)to assess for pericardial effusion. - Left ventricle: The cavity size was normal. There was moderate concentric hypertrophy. Systolic function was vigorous. The estimated ejection fraction was in the range of 65% to 70%. Wall motion was normal; there were no regional wall motion abnormalities. - Pericardium, extracardiac: A trivial pericardial effusion was identified posterior to the heart. There appears to be an apical fat pad.     ASSESSMENT AND PLAN:  1.  Paroxysmal atrial fibrillation: continue Warfarin. Avoid negative chronotropic agents. 2. Sinus node dysfunction: same as #1. Occurred while on Amiodarone. She's worn a monitor in the past and had a very bad dermatologic reaction. I am ordering a Holter monitor to see if she's experiencing any symptomatic bradycardia contributing to her fatigue. 3. Fatigue: while she denies chest pain and SOB, and I don't appreciate a h/o snoring/apnea, I want to rule out occult ischemic heart disease. Her labs are all within normal limits. I will order a Lexiscan Myoview stress test. If that is unrevealing, I would consider a sleep study.  3. HTN: uncontrolled, will increase Amlodipine to 5 mg daily.  Signed: Prentice Docker, M.D., F.A.C.C. 12/16/2012, 12:03 PM

## 2012-12-16 NOTE — Patient Instructions (Addendum)
Your physician recommends that you schedule a follow-up appointment in: 4-6 WEEKS WITH Dr. Purvis Sheffield   Your physician has recommended that you wear a holter monitor. Holter monitors are medical devices that record the heart's electrical activity. Doctors most often use these monitors to diagnose arrhythmias. Arrhythmias are problems with the speed or rhythm of the heartbeat. The monitor is a small, portable device. You can wear one while you do your normal daily activities. This is usually used to diagnose what is causing palpitations/syncope (passing out).24 HOLTER  Your physician has requested that you have a lexiscan myoview. For further information please visit [email or web address] Please follow instruction sheet, as given.

## 2012-12-19 ENCOUNTER — Ambulatory Visit (INDEPENDENT_AMBULATORY_CARE_PROVIDER_SITE_OTHER): Payer: Medicare Other | Admitting: *Deleted

## 2012-12-19 DIAGNOSIS — Z7901 Long term (current) use of anticoagulants: Secondary | ICD-10-CM

## 2012-12-19 DIAGNOSIS — I4891 Unspecified atrial fibrillation: Secondary | ICD-10-CM

## 2012-12-26 ENCOUNTER — Encounter (HOSPITAL_COMMUNITY)
Admission: RE | Admit: 2012-12-26 | Discharge: 2012-12-26 | Disposition: A | Discharge status: Home / Self Care | Payer: Medicare Other | Source: Ambulatory Visit | Attending: Cardiovascular Disease | Admitting: Cardiovascular Disease

## 2012-12-26 ENCOUNTER — Encounter (HOSPITAL_COMMUNITY): Payer: Medicare Other

## 2012-12-26 ENCOUNTER — Encounter (HOSPITAL_COMMUNITY): Payer: Self-pay

## 2012-12-26 ENCOUNTER — Ambulatory Visit (HOSPITAL_COMMUNITY)
Admission: RE | Admit: 2012-12-26 | Discharge: 2012-12-26 | Disposition: A | Discharge status: Home / Self Care | Payer: Medicare Other | Source: Ambulatory Visit | Attending: Cardiovascular Disease | Admitting: Cardiovascular Disease

## 2012-12-26 ENCOUNTER — Encounter (HOSPITAL_COMMUNITY): Admission: RE | Admit: 2012-12-26 | Payer: Medicare Other | Source: Ambulatory Visit

## 2012-12-26 DIAGNOSIS — I10 Essential (primary) hypertension: Secondary | ICD-10-CM | POA: Insufficient documentation

## 2012-12-26 DIAGNOSIS — R5383 Other fatigue: Secondary | ICD-10-CM

## 2012-12-26 DIAGNOSIS — I495 Sick sinus syndrome: Secondary | ICD-10-CM

## 2012-12-26 DIAGNOSIS — E7800 Pure hypercholesterolemia, unspecified: Secondary | ICD-10-CM

## 2012-12-26 DIAGNOSIS — I4891 Unspecified atrial fibrillation: Secondary | ICD-10-CM | POA: Insufficient documentation

## 2012-12-26 DIAGNOSIS — I480 Paroxysmal atrial fibrillation: Secondary | ICD-10-CM

## 2012-12-26 DIAGNOSIS — R5381 Other malaise: Secondary | ICD-10-CM | POA: Insufficient documentation

## 2012-12-26 MED ORDER — TECHNETIUM TC 99M SESTAMIBI - CARDIOLITE
10.0000 | Freq: Once | INTRAVENOUS | Status: AC | PRN
  Administered 2012-12-26: 10 via INTRAVENOUS

## 2012-12-26 MED ORDER — TECHNETIUM TC 99M SESTAMIBI - CARDIOLITE
30.0000 | Freq: Once | INTRAVENOUS | Status: AC | PRN
  Administered 2012-12-26: 12:00:00 30 via INTRAVENOUS

## 2012-12-26 NOTE — Progress Notes (Signed)
Stress Lab Nurses Notes - Virdell Hoiland 12/26/2012  Reason for doing test: Chest Pain and Dyspnea  Type of test: Steffanie Dunn  Nurse performing test: Marlena Clipper, RN  Nuclear Medicine Tech: Lou Cal  Echo Tech: Not Applicable  MD performing test: R. Dietrich Pates  Family MD: Vyas  Test explained and consent signed: yes  IV started: 24g jelco, Saline lock flushed, No redness or edema and Saline lock started in radiology  Symptoms: None  Treatment/Intervention: None  Reason test stopped: protocol completed  After recovery IV was: Discontinued via X-ray tech and Saline Lock flushed  Patient to return to Nuc. Med at :112;30  Patient discharged: Home  Patient's Condition upon discharge was: stable  Comments: No symptoms noted. Resting HR was 54 and resting BP was 143/85, Peak HR was 79 and peak HR was 140/76. Symptoms resolved in recovery.  Angelica Pou

## 2012-12-26 NOTE — Progress Notes (Signed)
*  PRELIMINARY RESULTS* Echocardiogram 224H Holter monitor has been performed.  Sierra Good 12/26/2012, 3:43 PM

## 2013-01-02 ENCOUNTER — Encounter (HOSPITAL_COMMUNITY): Payer: Self-pay

## 2013-01-02 ENCOUNTER — Encounter (HOSPITAL_BASED_OUTPATIENT_CLINIC_OR_DEPARTMENT_OTHER): Payer: Medicare Other

## 2013-01-02 VITALS — BP 147/69 | HR 51 | Temp 97.3°F | Resp 16 | Wt 141.4 lb

## 2013-01-02 DIAGNOSIS — C8581 Other specified types of non-Hodgkin lymphoma, lymph nodes of head, face, and neck: Secondary | ICD-10-CM

## 2013-01-02 DIAGNOSIS — C8591 Non-Hodgkin lymphoma, unspecified, lymph nodes of head, face, and neck: Secondary | ICD-10-CM

## 2013-01-02 NOTE — Patient Instructions (Addendum)
[facility] Discharge Instructions  RECOMMENDATIONS MADE BY THE CONSULTANT AND ANY TEST RESULTS WILL BE SENT TO YOUR REFERRING PHYSICIAN.  EXAM FINDINGS BY THE PHYSICIAN TODAY AND SIGNS OR SYMPTOMS TO REPORT TO CLINIC OR PRIMARY PHYSICIAN: Exam and discussion by Dr. Lazarus Salines.  Not sure your lack of energy is due to your cancer.  Would like for you to gradually increase your activity level and we will follow-up in 1 month.  MEDICATIONS PRESCRIBED:  None  INSTRUCTIONS GIVEN AND DISCUSSED: Report increased fatigue or shortness of breath.  SPECIAL INSTRUCTIONS/FOLLOW-UP: Blood work with next port flush and follow-up here in 1 month.  Thank you for choosing [facility] to provide your oncology and hematology care.  To afford each patient quality time with our providers, please arrive at least 15 minutes before your scheduled appointment time.  With your help, our goal is to use those 15 minutes to complete the necessary work-up to ensure our physicians have the information they need to help with your evaluation and healthcare recommendations.    Effective January 1st, 2014, we ask that you re-schedule your appointment with our physicians should you arrive 10 or more minutes late for your appointment.  We strive to give you quality time with our providers, and arriving late affects you and other patients whose appointments are after yours.    Again, thank you for choosing [facility].  Our hope is that these requests will decrease the amount of time that you wait before being seen by our physicians.       _____________________________________________________________  Should you have questions after your visit to [facility], please contact our office at [phone] between the hours of 8:30 a.m. and 5:00 p.m.  Voicemails left after 4:30 p.m. will not be returned until the following business day.  For prescription refill requests, have  your pharmacy contact our office with your prescription refill request.

## 2013-01-02 NOTE — Progress Notes (Signed)
[facility] [phone]   [phone]  OFFICE PROGRESS NOTE  [name], MD [address] [place] [address]  DIAGNOSIS: Diffuse large B. cell non-Hodgkin's lymphoma.  ONCOLOGIC HISTORY: According to Dr. Wilford Corner I AE versus stage II, diffuse large B-cell lymphoma, predominantly in the nasal cavity the right side with one lymph node on the right also apparently involved with this CD20 positive lymphoma treated with 3 cycles of R.-CVP using modified doses for cycles 2 and 3 because of neutropenia, severe weakness, fatigue, atrial fibrillation all required hospitalization after cycle 1. After chemotherapy she received radiation therapy by Dr. Roselind Messier with establishment of a CR   INTERVAL HISTORY:   Sierra Good 79 y.o. female returns to the clinic today for scheduled follow up accompanied by her friend Leona Singleton.  Patient tells me she is being fatigued especially for the last 3 months.  She tells me that some days are better than others .  She denies any associated weight loss, fever or night sweats.  She has not noticed any lymphadenopathy.  She states that her appetite is fair and denies shortness of breath.  She lives alone but reportedly have 2 daughters live close by and check on her regularly.  MEDICAL HISTORY: Past Medical History  Diagnosis Date  . GERD (gastroesophageal reflux disease)        . Hyperlipidemia   . Hypertension   . Osteoarthritis of lumbar spine   . PONV (postoperative nausea and vomiting) 2009    after total hip  . Deviated nasal septum     pt had septoplasty  . Nasal turbinate hypertrophy     bilateral  . Cancer     nasal cancer  . Cancer     cancer in the nose and in lower jaw  . Atrial fibrillation   . Dizziness   . Itching   . Diffuse large B cell lymphoma     Dr. Mariel Sleet -Nasal cavity, right sided lymph adenopathy  . OAB (overactive bladder)   . 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  MEDICATIONS:  Current Outpatient Prescriptions  Medication Sig Dispense Refill  . amLODipine (NORVASC) 5 MG tablet Take 1 tablet (5 mg total) by mouth daily.  180 tablet  1  . Calcium-Vitamin D (CALTRATE 600 PLUS-VIT D PO) Take 1 tablet by mouth daily.      . clobetasol cream (TEMOVATE) 0.05 % Apply 1 application topically 2 (two) times daily.       . Multiple Vitamins-Minerals (CENTRUM SILVER ULTRA WOMENS) TABS Take 1 tablet by mouth daily.        Marland Kitchen oxybutynin (DITROPAN) 5 MG tablet Take 5 mg by mouth 2 (two) times daily.      . pantoprazole (PROTONIX) 40 MG tablet Take 40 mg by mouth daily.       Marland Kitchen warfarin (COUMADIN) 4 MG tablet Take 1 tablet (4 mg total) by mouth daily. 4 mg daily except 2 days she takes 1/2 tablet  30 tablet  4   No current facility-administered medications for this visit.   Facility-Administered Medications Ordered in Other Visits  Medication Dose Route Frequency Provider Last Rate Last Dose  . heparin lock flush 100 unit/mL  500 Units Intravenous Once Ellouise Newer, PA-C      . sodium chloride 0.9 % injection 10 mL  10 mL Intravenous PRN Ellouise Newer, PA-C  SURGICAL HISTORY:  Past Surgical History  Procedure Laterality Date  . Total hip arthroplasty  2009    Right, [facility] ([facility])  . Bunionectomy  20+yrs ago    bilateral, [facility]  . Hammer toe surgery      20+ yrs ago  . Cataract extraction w/ intraocular lens  implant, bilateral  2010    [facility]  . Nasal septoplasty w/ turbinoplasty  12/18/2010    Procedure: NASAL SEPTOPLASTY WITH TURBINATE REDUCTION;  Surgeon: Darletta Moll;  Location: [facility];  Service: ENT;  Laterality: Bilateral;  . Colon surgery  10/29/2008    Ingram-right hemicolectomy for tubulovillous adenoma (3cm) with HG glandular dysplasia  . Portacath placement  02/10/2011    Procedure: INSERTION PORT-A-CATH;  Surgeon: Marlane Hatcher;  Location: [facility];  Service: Good;  Laterality:  Left;  left subclavian  . Skin cancer removal  6/13  . Colonoscopy  09/28/2002    RMR: Normal colonic mucosa.  Redundant and elongated but otherwise normal appearing colon/normal rectum  . Colonoscopy  09/11/2008    RMR: Normal rectum/Diminutive sigmoid polyp status post snare removal/  Flat sessile semilunar lesion at and in the ileocecal valve status post hot snare piecemeal debulking as described above  . Colonoscopy  01/06/10    Rourk-normal rectum, normal residual colon status post right hemicolectomy. Next colonoscopy in 01/2015 health permiting  . Esophagogastroduodenoscopy (egd) with esophageal dilation  05/23/2012    XBJ:[id number] ring-dilated as described above. Hiatal hernia      REVIEW OF SYSTEMS: elderly and frail ambulates with a walker.14 point review of system is as in the history above otherwise negative.  PHYSICAL EXAMINATION:  Blood pressure 147/69, pulse 51, temperature 97.3 F (36.3 C), temperature source Oral, resp. rate 16, weight 141 lb 6.4 oz (64.139 kg). Good: No distress.elderly and frail. SKIN:  No rashes or significant lesions . HEAD: Normocephalic, No masses, lesions, tenderness or abnormalities  EYES: Conjunctiva are pink and non-injected  ENT: External ears normal ,lips, buccal mucosa, and tongue normal and mucous membranes are moist  LYMPH: No palpable lymphadenopathy, in the neck, supraclavicular, axillary or inguinal lymph node areas. LUNGS: clear to auscultation , no crackles or wheezes HEART:, S1 normal and S2 normal and  no S3. ABDOMEN: Abdomen soft, non-tender, no masses or organomegaly and no hepatosplenomegaly palpable EXTREMITIES: No edema, no skin discoloration or tenderness NEURO: alert & oriented , no focal motor deficits.      LABORATORY DATA: Lab Results  Component Value Date   WBC 8.1 12/14/2012   HGB 14.5 12/14/2012   HCT 42.3 12/14/2012   MCV 95.7 12/14/2012   PLT 223 12/14/2012      Chemistry      Component Value Date/Time   NA  142 12/14/2012 1119   NA 141 03/21/2012 0828   K 3.9 12/14/2012 1119   K 4.0 03/21/2012 0828   CL 105 12/14/2012 1119   CL 100 03/21/2012 0828   CO2 29 12/14/2012 1119   BUN 17 12/14/2012 1119   BUN 14 03/21/2012 0828   CREATININE 0.90 12/14/2012 1119   CREATININE 0.87 03/21/2012 0828      Component Value Date/Time   CALCIUM 9.9 12/14/2012 1119   CALCIUM 10.4 03/21/2012 0828   ALKPHOS 75 12/14/2012 1119   ALKPHOS 100 03/21/2012 0828   AST 19 12/14/2012 1119   AST 17 03/21/2012 0828   ALT 10 12/14/2012 1119   BILITOT 0.4 12/14/2012 1119   BILITOT 0.3 03/21/2012 3086  RADIOGRAPHIC STUDIES: Nm Myocar Single W/spect W/wall Motion And Ef  12/28/2012   NUCLEAR MEDICINE ADENOSINE STRESS MYOVIEW STUDY WITH SPECT AND LEFT VENTRIUCLAR EJECTION FRACTION   IMPRESSION: Probably negative pharmacologic stress nuclear myocardial study revealing apparently abnormal perfusion consistent with physiologic apical thinning and overlying breast attenuation.  The possibility of minimal anteroseptal and apical ischemia cannot be unequivocally excluded. An apparent small segmental wall motion abnormality likely represents artifact.  Other findings as noted.   Original Report Authenticated By: House Bing      ASSESSMENT:  She has no clinical evidence of disease as regards her lymphoma at this time. I think her fatigue is most likely related to decreased physical activity.  She has some gait and mechanical problems with ambulation which might be contributing.   PLAN:  1. Return to clinic in 1 month to see how her fatigue is improving with increased activity. 2. I offered ro repeat CBC and do CT scans of head and neck,chest,abd/pelvis patient declined. 3. Patient instructed to increase her physical activity.     All questions were satisfactorily answered. Patient knows to call if  any concern arises.  I spent more than 50 % counseling the patient face to face. The total time spent in the appointment was 30  minutes.    Sherral Hammers, MD FACP. Hematology/Oncology.

## 2013-01-11 ENCOUNTER — Other Ambulatory Visit: Payer: Self-pay

## 2013-01-23 ENCOUNTER — Ambulatory Visit (INDEPENDENT_AMBULATORY_CARE_PROVIDER_SITE_OTHER): Payer: Medicare Other | Admitting: *Deleted

## 2013-01-23 DIAGNOSIS — I4891 Unspecified atrial fibrillation: Secondary | ICD-10-CM

## 2013-01-23 DIAGNOSIS — Z7901 Long term (current) use of anticoagulants: Secondary | ICD-10-CM

## 2013-01-25 ENCOUNTER — Encounter (HOSPITAL_COMMUNITY): Payer: Self-pay

## 2013-01-26 ENCOUNTER — Ambulatory Visit (INDEPENDENT_AMBULATORY_CARE_PROVIDER_SITE_OTHER): Payer: Self-pay | Admitting: Otolaryngology

## 2013-01-30 ENCOUNTER — Ambulatory Visit (INDEPENDENT_AMBULATORY_CARE_PROVIDER_SITE_OTHER): Payer: Medicare Other | Admitting: Cardiovascular Disease

## 2013-01-30 ENCOUNTER — Encounter (HOSPITAL_COMMUNITY): Payer: Medicare Other | Attending: Oncology

## 2013-01-30 ENCOUNTER — Encounter: Payer: Self-pay | Admitting: Cardiovascular Disease

## 2013-01-30 VITALS — BP 152/70 | HR 44 | Ht 66.0 in | Wt 139.8 lb

## 2013-01-30 DIAGNOSIS — C8591 Non-Hodgkin lymphoma, unspecified, lymph nodes of head, face, and neck: Secondary | ICD-10-CM

## 2013-01-30 DIAGNOSIS — I4891 Unspecified atrial fibrillation: Secondary | ICD-10-CM

## 2013-01-30 DIAGNOSIS — Z452 Encounter for adjustment and management of vascular access device: Secondary | ICD-10-CM

## 2013-01-30 DIAGNOSIS — C8581 Other specified types of non-Hodgkin lymphoma, lymph nodes of head, face, and neck: Secondary | ICD-10-CM | POA: Insufficient documentation

## 2013-01-30 DIAGNOSIS — R5383 Other fatigue: Secondary | ICD-10-CM

## 2013-01-30 DIAGNOSIS — I495 Sick sinus syndrome: Secondary | ICD-10-CM

## 2013-01-30 DIAGNOSIS — R5381 Other malaise: Secondary | ICD-10-CM

## 2013-01-30 DIAGNOSIS — I10 Essential (primary) hypertension: Secondary | ICD-10-CM

## 2013-01-30 DIAGNOSIS — Z7901 Long term (current) use of anticoagulants: Secondary | ICD-10-CM

## 2013-01-30 LAB — COMPREHENSIVE METABOLIC PANEL
Albumin: 3.8 g/dL (ref 3.5–5.2)
BUN: 17 mg/dL (ref 6–23)
Creatinine, Ser: 0.81 mg/dL (ref 0.50–1.10)
Potassium: 4.1 mEq/L (ref 3.5–5.1)
Total Protein: 7.2 g/dL (ref 6.0–8.3)

## 2013-01-30 LAB — CBC WITH DIFFERENTIAL/PLATELET
Eosinophils Relative: 3 % (ref 0–5)
HCT: 42.8 % (ref 36.0–46.0)
Lymphocytes Relative: 16 % (ref 12–46)
Lymphs Abs: 1.3 10*3/uL (ref 0.7–4.0)
MCV: 95.5 fL (ref 78.0–100.0)
Monocytes Absolute: 0.5 10*3/uL (ref 0.1–1.0)
RBC: 4.48 MIL/uL (ref 3.87–5.11)
WBC: 7.9 10*3/uL (ref 4.0–10.5)

## 2013-01-30 MED ORDER — WARFARIN SODIUM 4 MG PO TABS: 4.0000 mg | ORAL_TABLET | Freq: Every day | ORAL | Status: DC

## 2013-01-30 MED ORDER — HEPARIN SOD (PORK) LOCK FLUSH 100 UNIT/ML IV SOLN
500.0000 [IU] | Freq: Once | INTRAVENOUS | Status: AC
  Administered 2013-01-30: 500 [IU] via INTRAVENOUS
  Filled 2013-01-30: qty 5

## 2013-01-30 MED ORDER — HEPARIN SOD (PORK) LOCK FLUSH 100 UNIT/ML IV SOLN
INTRAVENOUS | Status: AC
  Filled 2013-01-30: qty 5

## 2013-01-30 MED ORDER — SODIUM CHLORIDE 0.9 % IJ SOLN
10.0000 mL | INTRAMUSCULAR | Status: DC | PRN
Start: 2013-01-30 — End: 2013-01-30
  Administered 2013-01-30: 10 mL via INTRAVENOUS
  Filled 2013-01-30: qty 10

## 2013-01-30 NOTE — Progress Notes (Signed)
SUBJECTIVE: Sierra Good is an 79 y.o. Woman whom I saw in clinic in early July.  She has a PMH significant for paroxysmal atrial fibrillation (occurred during an illness) for which she takes Warfarin. She also has sinus node dysfunction with documented evidence of marked sinus bradycardia with a junctional escape rhythm. According to prior office visits, treatment with antiarrhythmic medications (Amiodarone) led to this, and for this reason, she has not been on any meds with a negative chronotropic effect.   When she came last time with her daughter, [name], she felt very fatigued, and had said she is unable to keep with daily activities. This had been occurring on a daily basis.  She denied chest pain and shortness of breath.  Even after a full night's rest, she feels tired.   I had her wear an event monitor, which showed sinus rhythm (HR 34-93 bpm) with an avg HR of 54 bpm, with PAC's and PVC's noted. There were some sinus pauses of roughly 2 seconds. She c/o feeling "weak" when her HR was in the 50's.  Today her HR is 44 bpm.  A Lexiscan Myoview was unremarkable, with a calculated LVEF of 63%, with essentially normal perfusion and overlying soft tissue artifact.  Filed Vitals:   01/30/13 1307  Height: 5\' 6"  (1.676 m)  Weight: 139 lb 12 oz (63.39 kg)    BP: 152/70 Pulse: 44   PHYSICAL EXAM General: NAD Neck: No JVD, no thyromegaly or thyroid nodule.  Lungs: Clear to auscultation bilaterally with normal respiratory effort. CV: Nondisplaced PMI.  Heart regular rhythm but bradycardic, 42 bpm, normal S1/S2, no S3/S4, no murmur.  No peripheral edema.  No carotid bruit.  Normal pedal pulses.  Abdomen: Soft, nontender, no hepatosplenomegaly, no distention.  Neurologic: Alert and oriented x 3.  Psych: Normal affect. Extremities: No clubbing or cyanosis.     LABS: Basic Metabolic Panel: No results found for this basename: NA, K, CL, CO2, GLUCOSE, BUN, CREATININE, CALCIUM, MG,  PHOS,  in the last 72 hours Liver Function Tests: No results found for this basename: AST, ALT, ALKPHOS, BILITOT, PROT, ALBUMIN,  in the last 72 hours No results found for this basename: LIPASE, AMYLASE,  in the last 72 hours CBC: No results found for this basename: WBC, NEUTROABS, HGB, HCT, MCV, PLT,  in the last 72 hours Cardiac Enzymes: No results found for this basename: CKTOTAL, CKMB, CKMBINDEX, TROPONINI,  in the last 72 hours BNP: No components found with this basename: POCBNP,  D-Dimer: No results found for this basename: DDIMER,  in the last 72 hours Hemoglobin A1C: No results found for this basename: HGBA1C,  in the last 72 hours Fasting Lipid Panel: No results found for this basename: CHOL, HDL, LDLCALC, TRIG, CHOLHDL, LDLDIRECT,  in the last 72 hours Thyroid Function Tests: No results found for this basename: TSH, T4TOTAL, FREET3, T3FREE, THYROIDAB,  in the last 72 hours Anemia Panel: No results found for this basename: VITAMINB12, FOLATE, FERRITIN, TIBC, IRON, RETICCTPCT,  in the last 72 hours  RADIOLOGY: No results found.    ASSESSMENT AND PLAN: 1. Paroxysmal atrial fibrillation: continue Warfarin. Avoid negative chronotropic agents. No significant episodes seen with monitoring. 2. Sinus node dysfunction: same as #1. Occurred initially while on Amiodarone, but now has documented evidence of symptomatic bradycardia, which is contributing to her fatigue, with HR's as low as 34 bpm and an avg HR of 54 bpm, and is 44 bpm today. I explained the optimal treatment for this  is a pacemaker, and the patient and her daughter appear to be amenable to this plan, but would like some time to think and talk about it. I will arrange for EP follow-up so this can be discussed in greater detail. 3. HTN: uncontrolled, but recently added Amlodipine 5 mg daily. Will monitor.    Prentice Docker, M.D., F.A.C.C.

## 2013-01-30 NOTE — Progress Notes (Signed)
[place] Gittings presented for Portacath access and flush. Proper placement of portacath confirmed by CXR. Portacath located lt chest wall accessed with  H 20 needle. Good blood return present. Portacath flushed with 20ml NS and 500U/72ml Heparin and needle removed intact. Procedure without incident. Patient tolerated procedure well.

## 2013-01-30 NOTE — Patient Instructions (Addendum)
Your physician recommends that you schedule a follow-up appointment in: AS NEEDED  Your physician recommends that you schedule a follow-up appointment in: EP DOCTOR ASAP

## 2013-02-01 IMAGING — CR DG CHEST 1V PORT
1 series · 1 of 1 positions shown · non-contrast
Comparison: 12/06/2007

CLINICAL DATA: Status post Port-A-Cath placement

PORTABLE CHEST - 1 VIEW

[view not recorded]
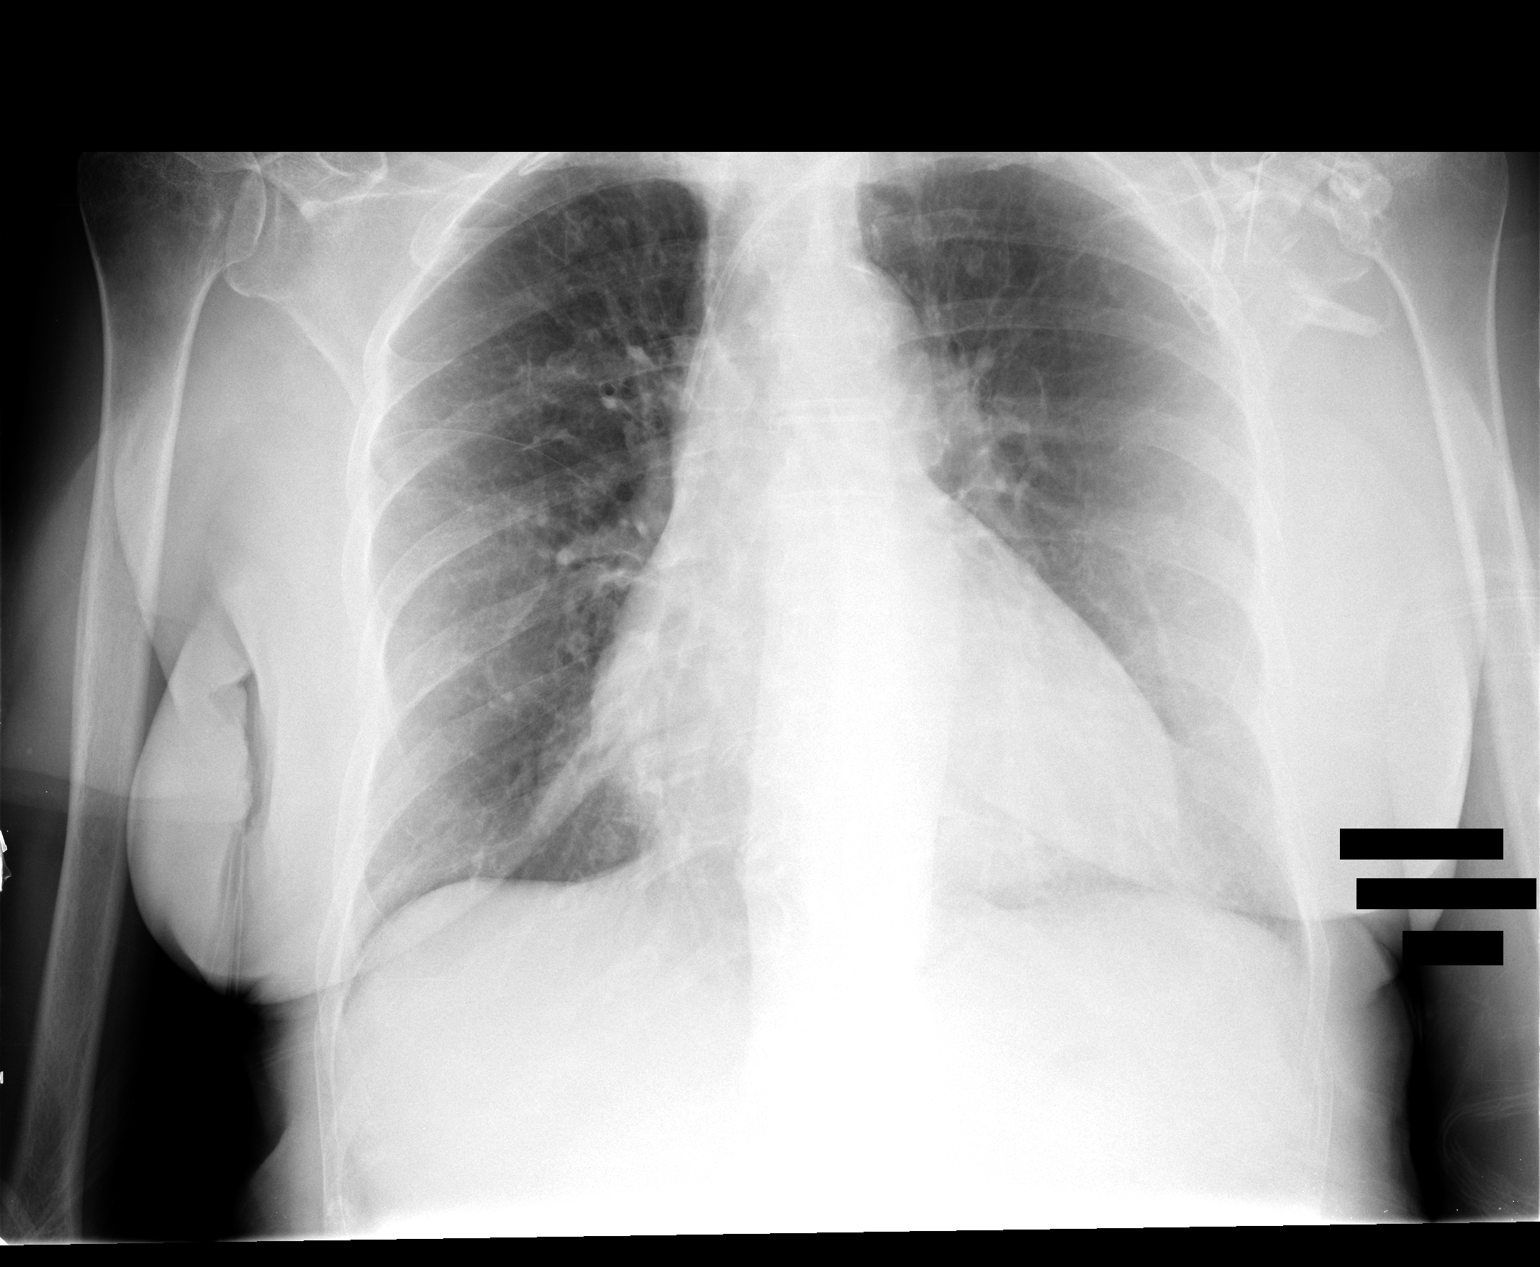

[1 of 1 positions shown; findings below may reference images not displayed]

FINDINGS: Left subclavian chest port with its tip in the mid SVC.
No pneumothorax.

The lungs are essentially clear. No pleural effusion or
pneumothorax.

Mild cardiomegaly.
IMPRESSION: Left subclavian chest port with its tip in the mid SVC.  No
pneumothorax.

Mild cardiomegaly.

## 2013-02-02 ENCOUNTER — Ambulatory Visit (HOSPITAL_COMMUNITY): Payer: Self-pay

## 2013-02-03 ENCOUNTER — Encounter: Payer: Self-pay | Admitting: Internal Medicine

## 2013-02-03 ENCOUNTER — Ambulatory Visit (INDEPENDENT_AMBULATORY_CARE_PROVIDER_SITE_OTHER): Payer: Medicare Other | Admitting: Internal Medicine

## 2013-02-03 ENCOUNTER — Encounter: Payer: Self-pay | Admitting: *Deleted

## 2013-02-03 VITALS — BP 144/72 | HR 60 | Ht 66.0 in | Wt 140.0 lb

## 2013-02-03 DIAGNOSIS — I4891 Unspecified atrial fibrillation: Secondary | ICD-10-CM

## 2013-02-03 DIAGNOSIS — I495 Sick sinus syndrome: Secondary | ICD-10-CM

## 2013-02-03 LAB — CBC WITH DIFFERENTIAL/PLATELET
Basophils Absolute: 0 10*3/uL (ref 0.0–0.1)
Eosinophils Relative: 2.9 % (ref 0.0–5.0)
Monocytes Relative: 6.1 % (ref 3.0–12.0)
Neutrophils Relative %: 74.6 % (ref 43.0–77.0)
Platelets: 234 10*3/uL (ref 150.0–400.0)
WBC: 7.7 10*3/uL (ref 4.5–10.5)

## 2013-02-03 LAB — BASIC METABOLIC PANEL
BUN: 15 mg/dL (ref 6–23)
CO2: 26 mEq/L (ref 19–32)
Chloride: 102 mEq/L (ref 96–112)
Creatinine, Ser: 0.8 mg/dL (ref 0.4–1.2)

## 2013-02-03 LAB — PROTIME-INR
INR: 2 ratio — ABNORMAL HIGH (ref 0.8–1.0)
Prothrombin Time: 20.8 s — ABNORMAL HIGH (ref 10.2–12.4)

## 2013-02-03 NOTE — Progress Notes (Signed)
HPI Sierra Good is referred today by Dr. Koneswaren ffor consideration for permanent pacemaker insertion. The patient is a very pleasant 79-year-old woman with a history of non-Hodgkin's lymphoma diagnosed in July of 2012.  She underwent resection, chemotherapy, and radiation, and appears to be cancer free. During her therapy, she developed atrial fibrillation and was placed on amiodarone. She developed bradycardia and her amiodarone was discontinued. The patient has continued to have problems with weakness, fatigue, and shortness of breath with exertion. She is on no AV nodal slowing drugs. She is treated with amlodipine for her hypertension. The patient underwent cardiac monitoring and had an average heart rate of 54 beats per minute with frequent daytime heart rates in the low 40s. In the office, her heart rate was 42 beats per minute. She also has pauses of over 2 seconds. She is on no reversible agents. Allergies  Allergen Reactions  . Statins Other (See Comments)    Causes leg cramps and muscle weakness     Current Outpatient Prescriptions  Medication Sig Dispense Refill  . amLODipine (NORVASC) 5 MG tablet Take 1 tablet (5 mg total) by mouth daily.  180 tablet  1  . Calcium-Vitamin D (CALTRATE 600 PLUS-VIT D PO) Take 1 tablet by mouth daily.      . clobetasol cream (TEMOVATE) 0.05 % Apply 1 application topically 2 (two) times daily.       . dexlansoprazole (DEXILANT) 60 MG capsule Take 60 mg by mouth daily.      . Multiple Vitamins-Minerals (CENTRUM SILVER ULTRA WOMENS) TABS Take 1 tablet by mouth daily.        . oxybutynin (DITROPAN) 5 MG tablet Take 5 mg by mouth 2 (two) times daily.      . warfarin (COUMADIN) 4 MG tablet Take 1 tablet (4 mg total) by mouth daily. 4 mg daily except 2 days she takes 1/2 tablet  30 tablet  3   No current facility-administered medications for this visit.   Facility-Administered Medications Ordered in Other Visits  Medication Dose Route Frequency Provider  Last Rate Last Dose  . heparin lock flush 100 unit/mL  500 Units Intravenous Once Thomas S Kefalas, PA-C      . sodium chloride 0.9 % injection 10 mL  10 mL Intravenous PRN Thomas S Kefalas, PA-C         Past Medical History  Diagnosis Date  . GERD (gastroesophageal reflux disease)        . Hyperlipidemia   . Hypertension   . Osteoarthritis of lumbar spine   . PONV (postoperative nausea and vomiting) 2009    after total hip  . Deviated nasal septum     pt had septoplasty  . Nasal turbinate hypertrophy     bilateral  . Cancer     nasal cancer  . Cancer     cancer in the nose and in lower jaw  . Atrial fibrillation   . Dizziness   . Itching   . Diffuse large B cell lymphoma     Dr. Neijstrom -Nasal cavity, right sided lymph adenopathy  . OAB (overactive bladder)   . Tubulovillous adenoma of colon 10/2008    w/ HG dysplasia    ROS:   All systems reviewed and negative except as noted in the HPI.   Past Surgical History  Procedure Laterality Date  . Total hip arthroplasty  2009    Right, [facility] ([facility])  . Bunionectomy  20+yrs ago    bilateral, [facility]  . Hammer   toe surgery      20+ yrs ago  . Cataract extraction w/ intraocular lens  implant, bilateral  2010    [facility]  . Nasal septoplasty w/ turbinoplasty  12/18/2010    Procedure: NASAL SEPTOPLASTY WITH TURBINATE REDUCTION;  Surgeon: Sui W Teoh;  Location: [facility];  Service: ENT;  Laterality: Bilateral;  . Colon surgery  10/29/2008    Ingram-right hemicolectomy for tubulovillous adenoma (3cm) with HG glandular dysplasia  . Portacath placement  02/10/2011    Procedure: INSERTION PORT-A-CATH;  Surgeon: William S Bradford;  Location: [facility];  Service: General;  Laterality: Left;  left subclavian  . Skin cancer removal  6/13  . Colonoscopy  09/28/2002    RMR: Normal colonic mucosa.  Redundant and elongated but otherwise normal appearing colon/normal rectum  . Colonoscopy  09/11/2008    RMR: Normal rectum/Diminutive sigmoid polyp  status post snare removal/  Flat sessile semilunar lesion at and in the ileocecal valve status post hot snare piecemeal debulking as described above  . Colonoscopy  01/06/10    Rourk-normal rectum, normal residual colon status post right hemicolectomy. Next colonoscopy in 01/2015 health permiting  . Esophagogastroduodenoscopy (egd) with esophageal dilation  05/23/2012    RMR:Schatzki's ring-dilated as described above. Hiatal hernia     Family History  Problem Relation Age of Onset  . Pseudochol deficiency Neg Hx   . Malignant hyperthermia Neg Hx   . Hypotension Neg Hx   . Anesthesia problems Neg Hx   . Cancer Mother   . Stroke Father      History   Social History  . Marital Status: Widowed    Spouse Name: N/A    Number of Children: N/A  . Years of Education: N/A   Occupational History  . Not on file.   Social History Main Topics  . Smoking status: Never Smoker   . Smokeless tobacco: Never Used  . Alcohol Use: No  . Drug Use: No  . Sexual Activity: Yes    Birth Control/ Protection: Post-menopausal   Other Topics Concern  . Not on file   Social History Narrative  . No narrative on file     BP 144/72  Pulse 60  Ht 5' 6" (1.676 m)  Wt 140 lb (63.504 kg)  BMI 22.61 kg/m2  Physical Exam:  Well appearing elderly woman, NAD HEENT: Unremarkable Neck:  No JVD, no thyromegally Back:  No CVA tenderness Lungs:  Clear with no wheezes, rales, or rhonchi. HEART:  Regular rate rhythm, no murmurs, no rubs, no clicks Abd:  soft, positive bowel sounds, no organomegally, no rebound, no guarding Ext:  2 plus pulses, no edema, no cyanosis, no clubbing Skin:  No rashes no nodules Neuro:  CN II through XII intact, motor grossly intact  EKG - sinus bradycardia at 42 beats per minute  Assess/Plan: 

## 2013-02-03 NOTE — Patient Instructions (Addendum)
Your physician has recommended that you have a pacemaker inserted. A pacemaker is a small device that is placed under the skin of your chest or abdomen to help control abnormal heart rhythms. This device uses electrical pulses to prompt the heart to beat at a normal rate. Pacemakers are used to treat heart rhythms that are too slow. Wire (leads) are attached to the pacemaker that goes into the chambers of you heart. This is done in the hospital and usually requires and overnight stay. Please see the instruction sheet given to you today for more information.  See instruction sheet 

## 2013-02-08 ENCOUNTER — Other Ambulatory Visit: Payer: Self-pay | Admitting: *Deleted

## 2013-02-08 ENCOUNTER — Encounter (HOSPITAL_COMMUNITY): Payer: Self-pay | Admitting: Pharmacy Technician

## 2013-02-08 DIAGNOSIS — R001 Bradycardia, unspecified: Secondary | ICD-10-CM

## 2013-02-15 MED ORDER — SODIUM CHLORIDE 0.9 % IR SOLN
80.0000 mg | Status: DC
  Filled 2013-02-15: qty 2

## 2013-02-15 MED ORDER — CEFAZOLIN SODIUM-DEXTROSE 2-3 GM-% IV SOLR
2.0000 g | INTRAVENOUS | Status: DC
  Filled 2013-02-15: qty 50

## 2013-02-16 ENCOUNTER — Encounter (HOSPITAL_COMMUNITY)
Admission: RE | Disposition: A | Discharge status: Home / Self Care | Payer: Medicare Other | Source: Ambulatory Visit | Attending: Internal Medicine

## 2013-02-16 ENCOUNTER — Ambulatory Visit (HOSPITAL_COMMUNITY)
Admission: RE | Admit: 2013-02-16 | Discharge: 2013-02-17 | Disposition: A | Discharge status: Home / Self Care | Payer: Medicare Other | Source: Ambulatory Visit | Attending: Internal Medicine | Admitting: Internal Medicine

## 2013-02-16 DIAGNOSIS — Z79899 Other long term (current) drug therapy: Secondary | ICD-10-CM | POA: Insufficient documentation

## 2013-02-16 DIAGNOSIS — I495 Sick sinus syndrome: Secondary | ICD-10-CM

## 2013-02-16 DIAGNOSIS — I4891 Unspecified atrial fibrillation: Secondary | ICD-10-CM | POA: Insufficient documentation

## 2013-02-16 DIAGNOSIS — R001 Bradycardia, unspecified: Secondary | ICD-10-CM

## 2013-02-16 DIAGNOSIS — E785 Hyperlipidemia, unspecified: Secondary | ICD-10-CM | POA: Insufficient documentation

## 2013-02-16 DIAGNOSIS — Z87898 Personal history of other specified conditions: Secondary | ICD-10-CM | POA: Insufficient documentation

## 2013-02-16 DIAGNOSIS — M1990 Unspecified osteoarthritis, unspecified site: Secondary | ICD-10-CM | POA: Insufficient documentation

## 2013-02-16 DIAGNOSIS — Z7901 Long term (current) use of anticoagulants: Secondary | ICD-10-CM | POA: Insufficient documentation

## 2013-02-16 DIAGNOSIS — Z452 Encounter for adjustment and management of vascular access device: Secondary | ICD-10-CM | POA: Insufficient documentation

## 2013-02-16 DIAGNOSIS — I10 Essential (primary) hypertension: Secondary | ICD-10-CM | POA: Insufficient documentation

## 2013-02-16 DIAGNOSIS — M47817 Spondylosis without myelopathy or radiculopathy, lumbosacral region: Secondary | ICD-10-CM | POA: Insufficient documentation

## 2013-02-16 HISTORY — PX: PORT-A-CATH REMOVAL: SHX5289

## 2013-02-16 HISTORY — PX: PACEMAKER INSERTION: SHX728

## 2013-02-16 HISTORY — PX: PERMANENT PACEMAKER INSERTION: SHX5480

## 2013-02-16 LAB — PROTIME-INR: Prothrombin Time: 19.8 seconds — ABNORMAL HIGH (ref 11.6–15.2)

## 2013-02-16 SURGERY — PERMANENT PACEMAKER INSERTION
Anesthesia: LOCAL

## 2013-02-16 MED ORDER — MUPIROCIN 2 % EX OINT
TOPICAL_OINTMENT | Freq: Two times a day (BID) | CUTANEOUS | Status: DC
  Administered 2013-02-16: 1 via NASAL
  Filled 2013-02-16: qty 22

## 2013-02-16 MED ORDER — ACETAMINOPHEN 325 MG PO TABS
325.0000 mg | ORAL_TABLET | ORAL | Status: DC | PRN
  Administered 2013-02-16: 650 mg via ORAL
  Filled 2013-02-16: qty 2

## 2013-02-16 MED ORDER — WARFARIN SODIUM 2 MG PO TABS
2.0000 mg | ORAL_TABLET | ORAL | Status: DC
  Filled 2013-02-16: qty 1

## 2013-02-16 MED ORDER — AMLODIPINE BESYLATE 5 MG PO TABS
5.0000 mg | ORAL_TABLET | Freq: Every day | ORAL | Status: DC
  Administered 2013-02-16: 5 mg via ORAL
  Filled 2013-02-16 (×2): qty 1

## 2013-02-16 MED ORDER — SODIUM CHLORIDE 0.9 % IV SOLN
INTRAVENOUS | Status: DC
  Administered 2013-02-16: 12:00:00 via INTRAVENOUS

## 2013-02-16 MED ORDER — ONDANSETRON HCL 4 MG/2ML IJ SOLN
4.0000 mg | Freq: Four times a day (QID) | INTRAMUSCULAR | Status: DC | PRN
  Administered 2013-02-17: 4 mg via INTRAVENOUS
  Filled 2013-02-16: qty 2

## 2013-02-16 MED ORDER — LIDOCAINE HCL (PF) 1 % IJ SOLN
INTRAMUSCULAR | Status: AC
  Filled 2013-02-16: qty 30

## 2013-02-16 MED ORDER — WARFARIN - PHYSICIAN DOSING INPATIENT: Freq: Every day | Status: DC

## 2013-02-16 MED ORDER — MIDAZOLAM HCL 5 MG/5ML IJ SOLN
INTRAMUSCULAR | Status: AC
  Filled 2013-02-16: qty 5

## 2013-02-16 MED ORDER — LIDOCAINE HCL (PF) 1 % IJ SOLN
INTRAMUSCULAR | Status: AC
  Filled 2013-02-16: qty 60

## 2013-02-16 MED ORDER — WARFARIN SODIUM 4 MG PO TABS
4.0000 mg | ORAL_TABLET | ORAL | Status: AC
  Administered 2013-02-16: 4 mg via ORAL
  Filled 2013-02-16: qty 1

## 2013-02-16 MED ORDER — FENTANYL CITRATE 0.05 MG/ML IJ SOLN
INTRAMUSCULAR | Status: AC
  Filled 2013-02-16: qty 2

## 2013-02-16 MED ORDER — OXYBUTYNIN CHLORIDE 5 MG PO TABS
5.0000 mg | ORAL_TABLET | Freq: Two times a day (BID) | ORAL | Status: DC
  Administered 2013-02-16: 5 mg via ORAL
  Filled 2013-02-16 (×3): qty 1

## 2013-02-16 MED ORDER — CHLORHEXIDINE GLUCONATE 4 % EX LIQD
60.0000 mL | Freq: Once | CUTANEOUS | Status: DC
  Filled 2013-02-16: qty 60

## 2013-02-16 MED ORDER — PANTOPRAZOLE SODIUM 40 MG PO TBEC
40.0000 mg | DELAYED_RELEASE_TABLET | Freq: Every day | ORAL | Status: DC
  Administered 2013-02-16: 40 mg via ORAL
  Filled 2013-02-16: qty 1

## 2013-02-16 MED ORDER — HEPARIN (PORCINE) IN NACL 2-0.9 UNIT/ML-% IJ SOLN
INTRAMUSCULAR | Status: AC
  Filled 2013-02-16: qty 500

## 2013-02-16 MED ORDER — WARFARIN - PHARMACIST DOSING INPATIENT: Freq: Every day | Status: DC

## 2013-02-16 MED ORDER — WARFARIN SODIUM 2 MG PO TABS: 2.0000 mg | ORAL_TABLET | Freq: Every day | ORAL | Status: DC

## 2013-02-16 MED ORDER — CEFAZOLIN SODIUM 1-5 GM-% IV SOLN
1.0000 g | Freq: Four times a day (QID) | INTRAVENOUS | Status: AC
  Administered 2013-02-16 – 2013-02-17 (×3): 1 g via INTRAVENOUS
  Filled 2013-02-16 (×3): qty 50

## 2013-02-16 NOTE — H&P (View-Only) (Signed)
HPI Sierra Good is referred today by Dr. Darl Householder ffor consideration for permanent pacemaker insertion. The patient is a very pleasant 79 year old woman with a history of non-Hodgkin's lymphoma diagnosed in July of 2012.  She underwent resection, chemotherapy, and radiation, and appears to be cancer free. During her therapy, she developed atrial fibrillation and was placed on amiodarone. She developed bradycardia and her amiodarone was discontinued. The patient has continued to have problems with weakness, fatigue, and shortness of breath with exertion. She is on no AV nodal slowing drugs. She is treated with amlodipine for her hypertension. The patient underwent cardiac monitoring and had an average heart rate of 54 beats per minute with frequent daytime heart rates in the low 40s. In the office, her heart rate was 42 beats per minute. She also has pauses of over 2 seconds. She is on no reversible agents. Allergies  Allergen Reactions  . Statins Other (See Comments)    Causes leg cramps and muscle weakness     Current Outpatient Prescriptions  Medication Sig Dispense Refill  . amLODipine (NORVASC) 5 MG tablet Take 1 tablet (5 mg total) by mouth daily.  180 tablet  1  . Calcium-Vitamin D (CALTRATE 600 PLUS-VIT D PO) Take 1 tablet by mouth daily.      . clobetasol cream (TEMOVATE) 0.05 % Apply 1 application topically 2 (two) times daily.       Marland Kitchen dexlansoprazole (DEXILANT) 60 MG capsule Take 60 mg by mouth daily.      . Multiple Vitamins-Minerals (CENTRUM SILVER ULTRA WOMENS) TABS Take 1 tablet by mouth daily.        Marland Kitchen oxybutynin (DITROPAN) 5 MG tablet Take 5 mg by mouth 2 (two) times daily.      Marland Kitchen warfarin (COUMADIN) 4 MG tablet Take 1 tablet (4 mg total) by mouth daily. 4 mg daily except 2 days she takes 1/2 tablet  30 tablet  3   No current facility-administered medications for this visit.   Facility-Administered Medications Ordered in Other Visits  Medication Dose Route Frequency Provider  Last Rate Last Dose  . heparin lock flush 100 unit/mL  500 Units Intravenous Once Ellouise Newer, PA-C      . sodium chloride 0.9 % injection 10 mL  10 mL Intravenous PRN Ellouise Newer, PA-C         Past Medical History  Diagnosis Date  . GERD (gastroesophageal reflux disease)        . Hyperlipidemia   . Hypertension   . Osteoarthritis of lumbar spine   . PONV (postoperative nausea and vomiting) 2009    after total hip  . Deviated nasal septum     pt had septoplasty  . Nasal turbinate hypertrophy     bilateral  . Cancer     nasal cancer  . Cancer     cancer in the nose and in lower jaw  . Atrial fibrillation   . Dizziness   . Itching   . Diffuse large B cell lymphoma     Dr. Mariel Sleet -Nasal cavity, right sided lymph adenopathy  . OAB (overactive bladder)   . Tubulovillous adenoma of colon 10/2008    w/ HG dysplasia    ROS:   All systems reviewed and negative except as noted in the HPI.   Past Surgical History  Procedure Laterality Date  . Total hip arthroplasty  2009    Right, [facility] (general)  . Bunionectomy  20+yrs ago    bilateral, [facility]  . Lewie Chamber  toe surgery      20+ yrs ago  . Cataract extraction w/ intraocular lens  implant, bilateral  2010    [facility]  . Nasal septoplasty w/ turbinoplasty  12/18/2010    Procedure: NASAL SEPTOPLASTY WITH TURBINATE REDUCTION;  Surgeon: Darletta Moll;  Location: [facility];  Service: ENT;  Laterality: Bilateral;  . Colon surgery  10/29/2008    Ingram-right hemicolectomy for tubulovillous adenoma (3cm) with HG glandular dysplasia  . Portacath placement  02/10/2011    Procedure: INSERTION PORT-A-CATH;  Surgeon: Marlane Hatcher;  Location: [facility];  Service: General;  Laterality: Left;  left subclavian  . Skin cancer removal  6/13  . Colonoscopy  09/28/2002    RMR: Normal colonic mucosa.  Redundant and elongated but otherwise normal appearing colon/normal rectum  . Colonoscopy  09/11/2008    RMR: Normal rectum/Diminutive sigmoid polyp  status post snare removal/  Flat sessile semilunar lesion at and in the ileocecal valve status post hot snare piecemeal debulking as described above  . Colonoscopy  01/06/10    Rourk-normal rectum, normal residual colon status post right hemicolectomy. Next colonoscopy in 01/2015 health permiting  . Esophagogastroduodenoscopy (egd) with esophageal dilation  05/23/2012    WUJ:[id number]'G ring-dilated as described above. Hiatal hernia     Family History  Problem Relation Age of Onset  . Pseudochol deficiency Neg Hx   . Malignant hyperthermia Neg Hx   . Hypotension Neg Hx   . Anesthesia problems Neg Hx   . Cancer Mother   . Stroke Father      History   Social History  . Marital Status: Widowed    Spouse Name: N/A    Number of Children: N/A  . Years of Education: N/A   Occupational History  . Not on file.   Social History Main Topics  . Smoking status: Never Smoker   . Smokeless tobacco: Never Used  . Alcohol Use: No  . Drug Use: No  . Sexual Activity: Yes    Birth Control/ Protection: Post-menopausal   Other Topics Concern  . Not on file   Social History Narrative  . No narrative on file     BP 144/72  Pulse 60  Ht 5\' 6"  (1.676 m)  Wt 140 lb (63.504 kg)  BMI 22.61 kg/m2  Physical Exam:  Well appearing elderly woman, NAD HEENT: Unremarkable Neck:  No JVD, no thyromegally Back:  No CVA tenderness Lungs:  Clear with no wheezes, rales, or rhonchi. HEART:  Regular rate rhythm, no murmurs, no rubs, no clicks Abd:  soft, positive bowel sounds, no organomegally, no rebound, no guarding Ext:  2 plus pulses, no edema, no cyanosis, no clubbing Skin:  No rashes no nodules Neuro:  CN II through XII intact, motor grossly intact  EKG - sinus bradycardia at 42 beats per minute  Assess/Plan:

## 2013-02-16 NOTE — Interval H&P Note (Signed)
History and Physical Interval Note:  02/16/2013 2:02 PM  Sierra Good  has presented today for surgery, with the diagnosis of Hear block  The various methods of treatment have been discussed with the patient and family. After consideration of risks, benefits and other options for treatment, the patient has consented to  Procedure(s): PERMANENT PACEMAKER INSERTION (N/A) as a surgical intervention for symptomatic sinus node dysfunction .  The patient's history has been reviewed, patient examined, no change in status, stable for surgery.  I have reviewed the patient's chart and labs.  Questions were answered to the patient's satisfaction.     Leonia Reeves.D.

## 2013-02-16 NOTE — Progress Notes (Signed)
PHARMACIST - PHYSICIAN COMMUNICATION DR: Ladona Ridgel CONCERNING: Warfarin Safety and Education  DESCRIPTION:  Today's INR value is 1.74 and no further protimes have been ordered.   Pharmacy has ordered protimes x 3 only. No further protimes will be ordered by Pharmacy.  RECOMMENDATION: Initiate orders for further INR monitoring that is appropriate for the treatment plan of this patient  Link Snuffer, PharmD, BCPS Clinical Pharmacist [phone]

## 2013-02-16 NOTE — CV Procedure (Signed)
DDD PPM insertion and removal of a port - a cath without immediate complication. V#[id number].

## 2013-02-17 ENCOUNTER — Ambulatory Visit (HOSPITAL_COMMUNITY): Payer: Medicare Other

## 2013-02-17 LAB — PROTIME-INR
INR: 1.41 (ref 0.00–1.49)
Prothrombin Time: 16.9 seconds — ABNORMAL HIGH (ref 11.6–15.2)

## 2013-02-17 MED ORDER — OXYCODONE HCL 5 MG PO TABS
5.0000 mg | ORAL_TABLET | Freq: Once | ORAL | Status: AC
  Administered 2013-02-17: 5 mg via ORAL
  Filled 2013-02-17: qty 1

## 2013-02-17 MED ORDER — WARFARIN SODIUM 4 MG PO TABS: 2.0000 mg | ORAL_TABLET | Freq: Every day | ORAL | Status: DC

## 2013-02-17 NOTE — Progress Notes (Signed)
pts assessment unchanged from this am. Reviewed d/c instructions with POA. Pt wheeled out to private vehicle without any complaints or problems

## 2013-02-17 NOTE — Op Note (Signed)
NAMEALAZAE, CRYMES             ACCOUNT NO.:  [id number]  MEDICAL RECORD NO.:  [id number]  LOCATION:  [id number]                        FACILITY:  [facility]  PHYSICIAN:  Doylene Canning. Ladona Ridgel, MD    DATE OF BIRTH:  [birth year]  DATE OF PROCEDURE:  02/16/2013 DATE OF DISCHARGE:                              OPERATIVE REPORT   PROCEDURE PERFORMED:  Insertion of a dual-chamber pacemaker and removal of a previously implanted Port-A-Cath.  INTRODUCTION:  The patient is an 79 year old woman with a history of symptomatic bradycardia, demonstrated by sinus node dysfunction with long pauses and heart rate during the daytime in the low 40s, associated with fatigue and weakness.  She is on no reversible causes of her symptomatic sinus node dysfunction, and is now referred for insertion of a dual-chamber pacemaker.  Of note, the patient has an implantable portable Port-A-Cath and this will be removed at the time of her procedure, so that we can reduce the risk of infection.  PROCEDURE:  After informed consent was obtained, the patient was taken to the Diagnostic EP Lab in the fasting state.  After usual preparation and draping, intravenous fentanyl and midazolam was given for sedation. A 30 mL of lidocaine was infiltrated into the right infraclavicular region.  A 5-cm incision was carried out over this region, and electrocautery was utilized to dissect down to the fascial plane.  The right subclavian vein was then punctured and the Medtronic, model 5076, 52-cm active fixation pacing lead, serial number [id number] was advanced to the right ventricle and the Medtronic, model 5076, 45-cm active fixation pacing lead, serial number [id number] was advanced to the right atrium.  Mapping was carried out in the right ventricle, and at the final site, the R-waves measured 11 millivolts and the pacing impedance with the lead actively fixed was 740 ohms and threshold 0.5 volts at 0.5 milliseconds.  The 10-volt pacing  did not stimulate the diaphragm and there was a large injury current with active fixation of the lead.  With these satisfactory parameters, attention was then turned to the atrial lead, which was placed in the anterolateral portion of the right atrium where the P-waves measured 3 millivolts and the pacing impedance was 1000 ohms.  The threshold was initially 1.6 volts at 0.5 milliseconds and there was a very large injury current present.  The 10-volt pacing did not stimulate the diaphragm.  With both the atrial and ventricular leads in satisfactory position, they were secured to the subpectoral fascia with a figure-of-eight silk suture and the sewing sleeve was secured with silk suture.  Electrocautery was then utilized to make a subcutaneous pocket.  Antibiotic irrigation was utilized to irrigate the pocket and electrocautery was utilized to assure hemostasis.  The Medtronic Sensia dual-chamber pacemaker, serial number [id number] was connected to the atrial and ventricular leads and placed back in the subcutaneous pocket.  The pocket was irrigated with additional antibiotic irrigation, and the incision was closed with 2-0 and 3-0 Vicryl.  Benzoin and Steri-Strips were painted on the skin, pressure dressing was applied, and the patient was returned to her room in satisfactory condition.  At this point, we changed our attention to removal of the Port-A-Cath. A 30 mL  of lidocaine was infiltrated over the old Port-A-Cath insertion site and a 3-cm incision was carried out.  Electrocautery was utilized to dissect down to the Port-A-Cath and it was removed with gentle traction.  Pressure was held to assure hemostasis.  Electrocautery was utilized to assure hemostasis and the pocket was irrigated with antibiotic irrigation, and the incision was extended up with 3-0 Vicryl suture.  Benzoin and Steri-Strips were painted on the skin and the pressure dressing was also applied to this incision.  The  patient was returned to her room in satisfactory condition.  COMPLICATIONS:  There were no immediate procedure complications.  RESULTS:  This demonstrates successful insertion of a Medtronic dual- chamber pacemaker secondary to symptomatic bradycardia, sinus node dysfunction followed by removal of a previously implanted Port-A-Cath, which had been placed in the left subclavian vein.     Doylene Canning. Ladona Ridgel, MD     GWT/MEDQ  D:  02/16/2013  T:  02/17/2013  Job:  [id number]  cc:   Laqueta Linden, M.D.

## 2013-02-17 NOTE — Discharge Summary (Signed)
ELECTROPHYSIOLOGY DISCHARGE SUMMARY    Patient ID: Sierra Good,  MRN: [id number], DOB/AGE: 12-Jun-1922 79 y.o.  Admit date: 02/16/2013 Discharge date: 02/17/2013  Primary Care Physician: Doreen Beam, MD  Primary Cardiologist / EP: Purvis Sheffield, MD / Ladona Ridgel, MD  Primary Discharge Diagnosis:  1. Symptomatic bradycardia with sinus node dysfunction s/p PPM implant 2. Port-A-Cath removal  Secondary Discharge Diagnoses:  1. Atrial fibrillation 2. B-cell lymphoma 3. HTN 4. Dyslipidemia 5. Osteoarthritis  Procedures This Admission:  1. PPM implantation and Port-A-Cath removal RA lead - Medtronic, model 5076, 45-cm active fixation pacing lead, serial number [id number]  RV lead - Medtronic, model 5076, 52-cm active fixation pacing lead, serial number [id number]  Device - Medtronic Sensia dual-chamber pacemaker, serial number [id number]   History and Hospital Course:  Sierra Good is an 79 year old woman with symptomatic bradycardia and documented sinus node dysfunction with long pauses and heart rate during the daytime in the low 40s, associated with fatigue and weakness. There were no reversible causes identified; therefore, she presented yesterday for insertion of a dual-chamber pacemaker. Of note, the patient also had her Port-A-Cath removed at the time of her procedure to reduce the risk of infection. Ms. Crume tolerated this procedure well without any immediate complication. She remains hemodynamically stable and afebrile. Her chest xray shows stable lead placement without pneumothorax. Her device interrogation shows normal PPM function with stable lead parameters/measurements. Her implant site is intact without significant bleeding or hematoma. She has been given discharge instructions including wound care and activity restrictions. She will follow-up in 10 days for wound check. She will hold her Coumadin today and resume usual dose tomorrow, 02/18/2013. Otherwise there were no changes made  to her medications. She will follow-up for INR and wound check on 02/28/2013 in our [place] office. She will see Dr. Ladona Ridgel in 3 months. She has been seen, examined and deemed stable for discharge today by Dr. Lewayne Bunting.  Physical Exam: Vitals: Blood pressure 157/74, pulse 61, temperature 97.8 F (36.6 C), temperature source Oral, resp. rate 18, height 5\' 6"  (1.676 m), weight 140 lb (63.504 kg), SpO2 97.00%.  General: Well developed, well appearing 79 year old female in no acute distress.  Neck: Supple. JVD not elevated.  Lungs: Clear bilaterally to auscultation without wheezes, rales, or rhonchi. Breathing is unlabored.  Heart: RRR S1 S2 without murmurs, rubs, or gallops.  Abdomen: Soft, non-distended.  Extremities: No clubbing or cyanosis. No edema. Distal pedal pulses are 2+ and equal bilaterally.  Neuro: Alert and oriented X 3. Moves all extremities spontaneously. No focal deficits.  Skin: Right upper chest / implant site intact without bleeding or hematoma.  Labs: Lab Results  Component Value Date   WBC 7.7 02/03/2013   HGB 14.8 02/03/2013   HCT 43.4 02/03/2013   MCV 94.5 02/03/2013   PLT 234.0 02/03/2013      Component Value Date/Time   NA 136 02/03/2013 1527   K 4.1 02/03/2013 1527   CL 102 02/03/2013 1527   CO2 26 02/03/2013 1527   GLUCOSE 111* 02/03/2013 1527   BUN 15 02/03/2013 1527   CREATININE 0.8 02/03/2013 1527   CALCIUM 10.0 02/03/2013 1527   GFRNONAA 63* 01/30/2013 1417   GFRAA 73* 01/30/2013 1417    Recent Labs  02/16/13 1200  INR 1.74*    Disposition:  The patient is being discharged in stable condition.  Follow-up:     Follow-up Information   Follow up with [facility] at [place] On 02/28/2013. (At 10:15 AM  for Coumadin follow-up (to have wound check following INR check))    Specialty:  Cardiology   Contact information:   [address] [place] [address] [phone]      Follow up with [facility] at [place] On 02/28/2013. (At 11:40  AM for wound check)    Specialty:  Cardiology   Contact information:   [address] [place] [address] [phone]      Follow up with [facility] at [place] On 05/15/2013. (At 11:30 AM for follow-up with Dr. Ladona Ridgel)    Specialty:  Cardiology   Contact information:   [address] [place] [address] [phone]     Discharge Medications:    Medication List         amLODipine 5 MG tablet  Commonly known as:  NORVASC  Take 1 tablet (5 mg total) by mouth daily.     CALTRATE 600 PLUS-VIT D PO  Take 1 tablet by mouth daily.     CENTRUM SILVER ULTRA WOMENS Tabs  Take 1 tablet by mouth daily.     clobetasol cream 0.05 %  Commonly known as:  TEMOVATE  Apply 1 application topically 2 (two) times daily.     DEXILANT 60 MG capsule  Generic drug:  dexlansoprazole  Take 60 mg by mouth daily.     oxybutynin 5 MG tablet  Commonly known as:  DITROPAN  Take 5 mg by mouth 2 (two) times daily.     warfarin 4 MG tablet  Commonly known as:  COUMADIN  Take 0.5-1 tablets (2-4 mg total) by mouth daily. 2 days of the week-0.5 half tab (2 mg total); All other days-1 tab (4 mg total). HOLD today then restart tomorrow 02/18/2013       Duration of Discharge Encounter: Greater than 30 minutes including physician time.  Signed, Rick Duff, PA-C 02/17/2013, 8:53 AM

## 2013-02-17 NOTE — Progress Notes (Signed)
UR completed. Yuki Purves RN CCM Case Mgmt 

## 2013-02-20 ENCOUNTER — Telehealth: Payer: Self-pay | Admitting: Internal Medicine

## 2013-02-20 ENCOUNTER — Encounter (HOSPITAL_COMMUNITY): Payer: Self-pay | Admitting: Emergency Medicine

## 2013-02-20 ENCOUNTER — Emergency Department (HOSPITAL_COMMUNITY)
Admission: EM | Admit: 2013-02-20 | Discharge: 2013-02-21 | Disposition: A | Discharge status: Home / Self Care | Payer: Medicare Other | Attending: Emergency Medicine | Admitting: Emergency Medicine

## 2013-02-20 ENCOUNTER — Emergency Department (HOSPITAL_COMMUNITY): Payer: Medicare Other

## 2013-02-20 DIAGNOSIS — Z8522 Personal history of malignant neoplasm of nasal cavities, middle ear, and accessory sinuses: Secondary | ICD-10-CM | POA: Insufficient documentation

## 2013-02-20 DIAGNOSIS — Z862 Personal history of diseases of the blood and blood-forming organs and certain disorders involving the immune mechanism: Secondary | ICD-10-CM | POA: Insufficient documentation

## 2013-02-20 DIAGNOSIS — Z8709 Personal history of other diseases of the respiratory system: Secondary | ICD-10-CM | POA: Insufficient documentation

## 2013-02-20 DIAGNOSIS — K219 Gastro-esophageal reflux disease without esophagitis: Secondary | ICD-10-CM | POA: Insufficient documentation

## 2013-02-20 DIAGNOSIS — M25519 Pain in unspecified shoulder: Secondary | ICD-10-CM | POA: Insufficient documentation

## 2013-02-20 DIAGNOSIS — R531 Weakness: Secondary | ICD-10-CM

## 2013-02-20 DIAGNOSIS — Z8639 Personal history of other endocrine, nutritional and metabolic disease: Secondary | ICD-10-CM | POA: Insufficient documentation

## 2013-02-20 DIAGNOSIS — I10 Essential (primary) hypertension: Secondary | ICD-10-CM | POA: Insufficient documentation

## 2013-02-20 DIAGNOSIS — Z87898 Personal history of other specified conditions: Secondary | ICD-10-CM | POA: Insufficient documentation

## 2013-02-20 DIAGNOSIS — R112 Nausea with vomiting, unspecified: Secondary | ICD-10-CM | POA: Insufficient documentation

## 2013-02-20 DIAGNOSIS — Z950 Presence of cardiac pacemaker: Secondary | ICD-10-CM | POA: Insufficient documentation

## 2013-02-20 DIAGNOSIS — Z79899 Other long term (current) drug therapy: Secondary | ICD-10-CM | POA: Insufficient documentation

## 2013-02-20 DIAGNOSIS — Z860100 Personal history of colon polyps, unspecified: Secondary | ICD-10-CM | POA: Insufficient documentation

## 2013-02-20 DIAGNOSIS — Z7901 Long term (current) use of anticoagulants: Secondary | ICD-10-CM | POA: Insufficient documentation

## 2013-02-20 DIAGNOSIS — K5900 Constipation, unspecified: Secondary | ICD-10-CM | POA: Insufficient documentation

## 2013-02-20 DIAGNOSIS — R5381 Other malaise: Secondary | ICD-10-CM | POA: Insufficient documentation

## 2013-02-20 DIAGNOSIS — D72829 Elevated white blood cell count, unspecified: Secondary | ICD-10-CM | POA: Insufficient documentation

## 2013-02-20 DIAGNOSIS — R3915 Urgency of urination: Secondary | ICD-10-CM | POA: Insufficient documentation

## 2013-02-20 DIAGNOSIS — R630 Anorexia: Secondary | ICD-10-CM | POA: Insufficient documentation

## 2013-02-20 LAB — CBC WITH DIFFERENTIAL/PLATELET
Basophils Absolute: 0 10*3/uL (ref 0.0–0.1)
Basophils Relative: 0 % (ref 0–1)
Eosinophils Absolute: 0.3 10*3/uL (ref 0.0–0.7)
Eosinophils Relative: 2 % (ref 0–5)
HCT: 40 % (ref 36.0–46.0)
Hemoglobin: 13.5 g/dL (ref 12.0–15.0)
Lymphocytes Relative: 6 % — ABNORMAL LOW (ref 12–46)
Lymphs Abs: 1.3 10*3/uL (ref 0.7–4.0)
MCH: 32 pg (ref 26.0–34.0)
MCHC: 33.8 g/dL (ref 30.0–36.0)
MCV: 94.8 fL (ref 78.0–100.0)
Monocytes Absolute: 1 10*3/uL (ref 0.1–1.0)
Monocytes Relative: 5 % (ref 3–12)
Neutro Abs: 18 10*3/uL — ABNORMAL HIGH (ref 1.7–7.7)
Neutrophils Relative %: 87 % — ABNORMAL HIGH (ref 43–77)
Platelets: 169 10*3/uL (ref 150–400)
RBC: 4.22 MIL/uL (ref 3.87–5.11)
RDW: 14 % (ref 11.5–15.5)
WBC: 20.6 10*3/uL — ABNORMAL HIGH (ref 4.0–10.5)

## 2013-02-20 LAB — HEPATIC FUNCTION PANEL
ALT: 10 U/L (ref 0–35)
AST: 19 U/L (ref 0–37)
Albumin: 3.6 g/dL (ref 3.5–5.2)
Alkaline Phosphatase: 79 U/L (ref 39–117)
Bilirubin, Direct: 0.1 mg/dL (ref 0.0–0.3)
Indirect Bilirubin: 0.4 mg/dL (ref 0.3–0.9)
Total Bilirubin: 0.5 mg/dL (ref 0.3–1.2)
Total Protein: 6.7 g/dL (ref 6.0–8.3)

## 2013-02-20 LAB — BASIC METABOLIC PANEL
BUN: 23 mg/dL (ref 6–23)
CO2: 27 mEq/L (ref 19–32)
Calcium: 10.1 mg/dL (ref 8.4–10.5)
Chloride: 98 mEq/L (ref 96–112)
Creatinine, Ser: 0.71 mg/dL (ref 0.50–1.10)
GFR calc Af Amer: 87 mL/min — ABNORMAL LOW (ref >90)
GFR calc non Af Amer: 75 mL/min — ABNORMAL LOW (ref >90)
Glucose, Bld: 119 mg/dL — ABNORMAL HIGH (ref 70–99)
Potassium: 4.1 mEq/L (ref 3.5–5.1)
Sodium: 135 mEq/L (ref 135–145)

## 2013-02-20 LAB — PROTIME-INR
INR: 2.66 — ABNORMAL HIGH (ref 0.00–1.49)
Prothrombin Time: 27.4 seconds — ABNORMAL HIGH (ref 11.6–15.2)

## 2013-02-20 LAB — TROPONIN I: Troponin I: <0.3 ng/mL (ref <0.30)

## 2013-02-20 NOTE — Telephone Encounter (Signed)
PLEASE ADVISE.

## 2013-02-20 NOTE — Telephone Encounter (Signed)
Would try tylenol or ibuprofen.

## 2013-02-20 NOTE — ED Notes (Signed)
Patient reports had pacemaker placed to right chest 02/16/13, old pacemaker to left chest removed. Complaining of generalized weakness since. Also reports right upper arm pain since, denies pain at this time. States took tylenol approximately 1 hour ago for pain.

## 2013-02-20 NOTE — Telephone Encounter (Signed)
Spoke to pt daughter to advise results/instructions. Pt daughter understood and noted pt has taken these same medications however only taking one tablet, pt will increase to two tablets per noted only helping slightly with one tablet

## 2013-02-20 NOTE — ED Provider Notes (Signed)
CSN: [id number]     Arrival date & time 02/20/13  2108 History   This chart was scribed for Dione Booze, MD by Arlan Organ, ED Scribe. This patient was seen in room APA04/APA04 and the patient's care was started 11:25 PM.    Chief Complaint  Patient presents with  . Weakness  . Arm Pain   The history is provided by the patient. No language interpreter was used.   HPI Comments: [name] is a 79 y.o. female who presents to the Emergency Department complaining of gradually worsening, constant weakness which started 4 days ago. She recently had a pacemaker placement on 02/16/13, and was not given any pain medication post op. Pt states she has had this weakness ever since the procedure. Pt she also has right arm pain which she has at baseline. She reports 1 episode of nausea and emesis, and has been constipated since the surgery.Pt reports not having much of an appetite for the last few days. Pt states she has been experiencing some urinary urgency. Per pts family, she has been having trouble walking due to increased weakness, along with a dried mouth. Pts family feel she may be dehydrated. Pt denies chills, dysuria, fever, CP.      Past Medical History  Diagnosis Date  . GERD (gastroesophageal reflux disease)        . Hyperlipidemia   . Hypertension   . Osteoarthritis of lumbar spine   . PONV (postoperative nausea and vomiting) 2009    after total hip  . Deviated nasal septum     pt had septoplasty  . Nasal turbinate hypertrophy     bilateral  . Cancer     nasal cancer  . Cancer     cancer in the nose and in lower jaw  . Atrial fibrillation   . Dizziness   . Itching   . Diffuse large B cell lymphoma     Dr. Mariel Sleet -Nasal cavity, right sided lymph adenopathy  . OAB (overactive bladder)   . 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 Total hip arthroplasty  2009    Right, [facility] (general)  .  Bunionectomy  20+yrs ago    bilateral, [facility]  . Hammer toe surgery      20+ yrs ago  . Cataract extraction w/ intraocular lens  implant, bilateral  2010    [facility]  . Nasal septoplasty w/ turbinoplasty  12/18/2010    Procedure: NASAL SEPTOPLASTY WITH TURBINATE REDUCTION;  Surgeon: Darletta Moll;  Location: [facility];  Service: ENT;  Laterality: Bilateral;  . Colon surgery  10/29/2008    Ingram-right hemicolectomy for tubulovillous adenoma (3cm) with HG glandular dysplasia  . Portacath placement  02/10/2011    Procedure: INSERTION PORT-A-CATH;  Surgeon: Marlane Hatcher;  Location: [facility];  Service: General;  Laterality: Left;  left subclavian  . Skin cancer removal  6/13  . Colonoscopy  09/28/2002    RMR: Normal colonic mucosa.  Redundant and elongated but otherwise normal appearing colon/normal rectum  . Colonoscopy  09/11/2008    RMR: Normal rectum/Diminutive sigmoid polyp status post snare removal/  Flat sessile semilunar lesion at and in the ileocecal valve status post hot snare piecemeal debulking as described above  . Colonoscopy  01/06/10    Rourk-normal rectum, normal residual colon status post right hemicolectomy. Next colonoscopy in 01/2015 health permiting  . Esophagogastroduodenoscopy (egd) with esophageal dilation  05/23/2012    ZOX:[id number] ring-dilated as described above. Hiatal hernia   Family History  Problem Relation Age of Onset  . Pseudochol deficiency Neg Hx   . Malignant hyperthermia Neg Hx   . Hypotension Neg Hx   . Anesthesia problems Neg Hx   . Cancer Mother   . Stroke Father    History  Substance Use Topics  . Smoking status: Never Smoker   . Smokeless tobacco: Never Used  . Alcohol Use: No   OB History   Grav Para Term Preterm Abortions TAB SAB Ect Mult Living                 Review of Systems  Constitutional: Positive for appetite change (decreased appeite). Negative for fever and chills.  Cardiovascular: Negative for chest pain.  Gastrointestinal: Positive  for nausea, vomiting and constipation.  Genitourinary: Negative for dysuria.  Musculoskeletal: Positive for arthralgias (right arm pain).  Neurological: Positive for weakness.  All other systems reviewed and are negative.    Allergies  Statins  Home Medications   Current Outpatient Rx  Name  Route  Sig  Dispense  Refill  . amLODipine (NORVASC) 5 MG tablet   Oral   Take 1 tablet (5 mg total) by mouth daily.   180 tablet   1   . Calcium-Vitamin D (CALTRATE 600 PLUS-VIT D PO)   Oral   Take 1 tablet by mouth daily.         . clobetasol cream (TEMOVATE) 0.05 %   Topical   Apply 1 application topically 2 (two) times daily.          Marland Kitchen dexlansoprazole (DEXILANT) 60 MG capsule   Oral   Take 60 mg by mouth daily.         . Multiple Vitamins-Minerals (CENTRUM SILVER ULTRA WOMENS) TABS   Oral   Take 1 tablet by mouth daily.           Marland Kitchen oxybutynin (DITROPAN) 5 MG tablet   Oral   Take 5 mg by mouth 2 (two) times daily.         Marland Kitchen warfarin (COUMADIN) 4 MG tablet   Oral   Take 2-4 mg by mouth daily. Patient takes 2mg  on Mon.,Wed.,Fri.,and 4mg  all other days.          BP 135/60  Pulse 64  Temp(Src) 98.9 F (37.2 C) (Oral)  Resp 21  Ht 5\' 6"  (1.676 m)  Wt 140 lb (63.504 kg)  BMI 22.61 kg/m2  SpO2 94% Physical Exam  Nursing note and vitals reviewed. Constitutional: She is oriented to person, place, and time. She appears well-developed and well-nourished.  HENT:  Head: Normocephalic and atraumatic.  Eyes: Conjunctivae and EOM are normal. Pupils are equal, round, and reactive to light.  Neck: Normal range of motion. Neck supple.  Cardiovascular: Normal rate, regular rhythm and normal heart sounds.   Pulmonary/Chest: Effort normal and breath sounds normal.  Chest dressing in place over right subclavian. No tenderness or swelling, but dressing was not removed     Abdominal: Soft. Bowel sounds are normal.  Musculoskeletal: Normal range of motion.  Mild pain  on ROM of right shoulder  Neurological: She is alert and oriented to person, place, and time.  Skin: Skin is warm and dry.  Psychiatric: She has a normal mood and affect.    ED Course  Procedures (including critical care time)  DIAGNOSTIC STUDIES: Oxygen Saturation is 94% on RA, Normal by my interpretation.  COORDINATION OF CARE: 12:06 AM- Will order CXR, UA, INR, blood work,  And EKG. Discussed treatment plan with pt at bedside and pt agreed to plan.     Labs Review Results for orders placed during the hospital encounter of 02/20/13  CBC WITH DIFFERENTIAL      Result Value Range   WBC 20.6 (*) 4.0 - 10.5 K/uL   RBC 4.22  3.87 - 5.11 MIL/uL   Hemoglobin 13.5  12.0 - 15.0 g/dL   HCT 81.1  91.4 - 78.2 %   MCV 94.8  78.0 - 100.0 fL   MCH 32.0  26.0 - 34.0 pg   MCHC 33.8  30.0 - 36.0 g/dL   RDW 95.6  21.3 - 08.6 %   Platelets 169  150 - 400 K/uL   Neutrophils Relative % 87 (*) 43 - 77 %   Neutro Abs 18.0 (*) 1.7 - 7.7 K/uL   Lymphocytes Relative 6 (*) 12 - 46 %   Lymphs Abs 1.3  0.7 - 4.0 K/uL   Monocytes Relative 5  3 - 12 %   Monocytes Absolute 1.0  0.1 - 1.0 K/uL   Eosinophils Relative 2  0 - 5 %   Eosinophils Absolute 0.3  0.0 - 0.7 K/uL   Basophils Relative 0  0 - 1 %   Basophils Absolute 0.0  0.0 - 0.1 K/uL  BASIC METABOLIC PANEL      Result Value Range   Sodium 135  135 - 145 mEq/L   Potassium 4.1  3.5 - 5.1 mEq/L   Chloride 98  96 - 112 mEq/L   CO2 27  19 - 32 mEq/L   Glucose, Bld 119 (*) 70 - 99 mg/dL   BUN 23  6 - 23 mg/dL   Creatinine, Ser 5.78  0.50 - 1.10 mg/dL   Calcium 46.9  8.4 - 62.9 mg/dL   GFR calc non Af Amer 75 (*) >90 mL/min   GFR calc Af Amer 87 (*) >90 mL/min  TROPONIN I      Result Value Range   Troponin I <0.30  <0.30 ng/mL  URINALYSIS, ROUTINE W REFLEX MICROSCOPIC      Result Value Range   Color, Urine YELLOW  YELLOW   APPearance CLEAR  CLEAR   Specific Gravity, Urine 1.015  1.005 - 1.030   pH 7.5  5.0 - 8.0   Glucose, UA NEGATIVE   NEGATIVE mg/dL   Hgb urine dipstick NEGATIVE  NEGATIVE   Bilirubin Urine NEGATIVE  NEGATIVE   Ketones, ur NEGATIVE  NEGATIVE mg/dL   Protein, ur NEGATIVE  NEGATIVE mg/dL   Urobilinogen, UA 0.2  0.0 - 1.0 mg/dL   Nitrite NEGATIVE  NEGATIVE   Leukocytes, UA NEGATIVE  NEGATIVE  PROTIME-INR      Result Value Range   Prothrombin Time 27.4 (*) 11.6 - 15.2 seconds   INR 2.66 (*) 0.00 - 1.49  HEPATIC FUNCTION PANEL      Result Value Range   Total Protein 6.7  6.0 - 8.3 g/dL   Albumin 3.6  3.5 - 5.2 g/dL   AST 19  0 - 37 U/L   ALT 10  0 - 35 U/L   Alkaline Phosphatase 79  39 - 117 U/L   Total Bilirubin 0.5  0.3 - 1.2 mg/dL   Bilirubin, Direct 0.1  0.0 - 0.3 mg/dL   Indirect Bilirubin 0.4  0.3 - 0.9 mg/dL   Imaging Review Dg Chest 2 View  02/20/2013  CLINICAL DATA:  Right chest and right upper extremity pain status post pacemaker insertion.  EXAM: CHEST  2 VIEW  COMPARISON:  02/17/2013  FINDINGS: Right anterior chest wall sequential pacemaker is stable. Is leads project within the right atrium right ventricle. There is no pneumothorax.  Cardiac silhouette is normal in size and configuration. The mediastinum is normal in contour. There are no hilar masses.  There is mild central bronchitic change as well as mild apical lung scarring, stable. The lungs are otherwise clear.  The bony thorax is demineralized but grossly intact.  IMPRESSION: No acute cardiopulmonary disease.  No radiographic evidence of a complication from pacemaker placement.   Electronically Signed   By: Amie Portland   On: 02/20/2013 23:21   Images viewed by me.   Date: 02/21/2013  Rate: 61  Rhythm: normal sinus rhythm  QRS Axis: normal  Intervals: normal  ST/T Wave abnormalities: nonspecific T wave changes  Conduction Disutrbances:none  Narrative Interpretation: Low voltage QRS, nonspecific T wave flattening. When compared with ECG of 02/17/2013, no significant changes are seen.  Old EKG Reviewed: unchanged   MDM   1.  Weakness   2. Leukocytosis    Generalized weakness of uncertain cause. Symptoms are suggestive of possible urinary tract infection. WBC is come back very elevated with a left shift, the patient does not appear toxic. Urinalysis has come back normal but will be sent for culture. Because of degree of leukocytosis, she will be given empiric dose of ceftriaxone and sent home with prescription for cephalexin pending the urine culture results. She does not appear on off to require hospitalization but patient and family were advised to return should her symptoms worsen.  I personally performed the services described in this documentation, which was scribed in my presence. The recorded information has been reviewed and is accurate.     Dione Booze, MD 02/21/13 [id number]

## 2013-02-20 NOTE — Telephone Encounter (Signed)
Patient just had device placed.  Would like RX for pain medicine. Please call patient's daughter/tgs

## 2013-02-21 LAB — URINALYSIS, ROUTINE W REFLEX MICROSCOPIC
Bilirubin Urine: NEGATIVE
Hgb urine dipstick: NEGATIVE
Ketones, ur: NEGATIVE mg/dL
Nitrite: NEGATIVE
Protein, ur: NEGATIVE mg/dL
Urobilinogen, UA: 0.2 mg/dL (ref 0.0–1.0)

## 2013-02-21 MED ORDER — DEXTROSE 5 % IV SOLN
1.0000 g | Freq: Once | INTRAVENOUS | Status: AC
  Administered 2013-02-21: 1 g via INTRAVENOUS
  Filled 2013-02-21: qty 10

## 2013-02-21 MED ORDER — CEPHALEXIN 500 MG PO CAPS: 500.0000 mg | ORAL_CAPSULE | Freq: Three times a day (TID) | ORAL | Status: DC

## 2013-02-21 MED ORDER — SODIUM CHLORIDE 0.9 % IV BOLUS (SEPSIS)
500.0000 mL | Freq: Once | INTRAVENOUS | Status: AC
  Administered 2013-02-21: 500 mL via INTRAVENOUS

## 2013-02-23 LAB — URINE CULTURE: Culture: NO GROWTH

## 2013-02-28 ENCOUNTER — Ambulatory Visit (INDEPENDENT_AMBULATORY_CARE_PROVIDER_SITE_OTHER): Payer: Medicare Other | Admitting: *Deleted

## 2013-02-28 ENCOUNTER — Encounter (INDEPENDENT_AMBULATORY_CARE_PROVIDER_SITE_OTHER): Payer: Medicare Other

## 2013-02-28 ENCOUNTER — Ambulatory Visit: Payer: Self-pay

## 2013-02-28 DIAGNOSIS — Z7901 Long term (current) use of anticoagulants: Secondary | ICD-10-CM

## 2013-02-28 DIAGNOSIS — I4891 Unspecified atrial fibrillation: Secondary | ICD-10-CM

## 2013-02-28 DIAGNOSIS — I498 Other specified cardiac arrhythmias: Secondary | ICD-10-CM

## 2013-02-28 LAB — PACEMAKER DEVICE OBSERVATION
AL AMPLITUDE: 2 mv
AL IMPEDENCE PM: 728 Ohm
AL THRESHOLD: 0.75 V
BAMS-0001: 150 {beats}/min
BATTERY VOLTAGE: 2.8 V
RV LEAD AMPLITUDE: 15.67 mv

## 2013-02-28 LAB — POCT INR: INR: 1.8

## 2013-02-28 NOTE — Progress Notes (Signed)
Wound check-PPM in office. 

## 2013-03-05 ENCOUNTER — Emergency Department (HOSPITAL_COMMUNITY): Payer: Medicare Other

## 2013-03-05 ENCOUNTER — Inpatient Hospital Stay (HOSPITAL_COMMUNITY)
Admission: EM | Admit: 2013-03-05 | Discharge: 2013-03-10 | DRG: 377 | Disposition: A | Discharge status: Skilled Nursing Facility | Payer: Medicare Other | Attending: Internal Medicine | Admitting: Internal Medicine

## 2013-03-05 ENCOUNTER — Encounter (HOSPITAL_COMMUNITY): Payer: Self-pay | Admitting: *Deleted

## 2013-03-05 DIAGNOSIS — E860 Dehydration: Secondary | ICD-10-CM | POA: Diagnosis present

## 2013-03-05 DIAGNOSIS — E876 Hypokalemia: Secondary | ICD-10-CM | POA: Diagnosis present

## 2013-03-05 DIAGNOSIS — Z66 Do not resuscitate: Secondary | ICD-10-CM | POA: Diagnosis present

## 2013-03-05 DIAGNOSIS — E785 Hyperlipidemia, unspecified: Secondary | ICD-10-CM | POA: Diagnosis present

## 2013-03-05 DIAGNOSIS — K222 Esophageal obstruction: Secondary | ICD-10-CM | POA: Diagnosis present

## 2013-03-05 DIAGNOSIS — N318 Other neuromuscular dysfunction of bladder: Secondary | ICD-10-CM | POA: Diagnosis present

## 2013-03-05 DIAGNOSIS — I4891 Unspecified atrial fibrillation: Secondary | ICD-10-CM | POA: Diagnosis present

## 2013-03-05 DIAGNOSIS — Z96649 Presence of unspecified artificial hip joint: Secondary | ICD-10-CM

## 2013-03-05 DIAGNOSIS — Z7901 Long term (current) use of anticoagulants: Secondary | ICD-10-CM

## 2013-03-05 DIAGNOSIS — J189 Pneumonia, unspecified organism: Secondary | ICD-10-CM | POA: Diagnosis present

## 2013-03-05 DIAGNOSIS — D649 Anemia, unspecified: Secondary | ICD-10-CM | POA: Diagnosis present

## 2013-03-05 DIAGNOSIS — M24611 Ankylosis, right shoulder: Secondary | ICD-10-CM

## 2013-03-05 DIAGNOSIS — Z87898 Personal history of other specified conditions: Secondary | ICD-10-CM

## 2013-03-05 DIAGNOSIS — J9600 Acute respiratory failure, unspecified whether with hypoxia or hypercapnia: Secondary | ICD-10-CM | POA: Diagnosis present

## 2013-03-05 DIAGNOSIS — K449 Diaphragmatic hernia without obstruction or gangrene: Secondary | ICD-10-CM | POA: Diagnosis present

## 2013-03-05 DIAGNOSIS — T45515A Adverse effect of anticoagulants, initial encounter: Secondary | ICD-10-CM | POA: Diagnosis present

## 2013-03-05 DIAGNOSIS — R0902 Hypoxemia: Secondary | ICD-10-CM

## 2013-03-05 DIAGNOSIS — K922 Gastrointestinal hemorrhage, unspecified: Principal | ICD-10-CM | POA: Diagnosis present

## 2013-03-05 DIAGNOSIS — Z9181 History of falling: Secondary | ICD-10-CM

## 2013-03-05 DIAGNOSIS — K920 Hematemesis: Secondary | ICD-10-CM

## 2013-03-05 DIAGNOSIS — R791 Abnormal coagulation profile: Secondary | ICD-10-CM | POA: Diagnosis present

## 2013-03-05 DIAGNOSIS — M25511 Pain in right shoulder: Secondary | ICD-10-CM | POA: Diagnosis present

## 2013-03-05 DIAGNOSIS — Z9221 Personal history of antineoplastic chemotherapy: Secondary | ICD-10-CM

## 2013-03-05 DIAGNOSIS — M47817 Spondylosis without myelopathy or radiculopathy, lumbosacral region: Secondary | ICD-10-CM | POA: Diagnosis present

## 2013-03-05 DIAGNOSIS — R1110 Vomiting, unspecified: Secondary | ICD-10-CM

## 2013-03-05 DIAGNOSIS — I10 Essential (primary) hypertension: Secondary | ICD-10-CM | POA: Diagnosis present

## 2013-03-05 DIAGNOSIS — D72829 Elevated white blood cell count, unspecified: Secondary | ICD-10-CM

## 2013-03-05 DIAGNOSIS — Z950 Presence of cardiac pacemaker: Secondary | ICD-10-CM

## 2013-03-05 DIAGNOSIS — C8591 Non-Hodgkin lymphoma, unspecified, lymph nodes of head, face, and neck: Secondary | ICD-10-CM

## 2013-03-05 DIAGNOSIS — K219 Gastro-esophageal reflux disease without esophagitis: Secondary | ICD-10-CM | POA: Diagnosis present

## 2013-03-05 DIAGNOSIS — Z23 Encounter for immunization: Secondary | ICD-10-CM

## 2013-03-05 DIAGNOSIS — M7500 Adhesive capsulitis of unspecified shoulder: Secondary | ICD-10-CM | POA: Diagnosis present

## 2013-03-05 LAB — URINALYSIS, ROUTINE W REFLEX MICROSCOPIC
Bilirubin Urine: NEGATIVE
Glucose, UA: NEGATIVE mg/dL
Hgb urine dipstick: NEGATIVE
Ketones, ur: NEGATIVE mg/dL
Leukocytes, UA: NEGATIVE
pH: 5.5 (ref 5.0–8.0)

## 2013-03-05 LAB — CBC WITH DIFFERENTIAL/PLATELET
Basophils Absolute: 0 10*3/uL (ref 0.0–0.1)
Basophils Relative: 0 % (ref 0–1)
Eosinophils Absolute: 0 10*3/uL (ref 0.0–0.7)
HCT: 41.7 % (ref 36.0–46.0)
Hemoglobin: 14 g/dL (ref 12.0–15.0)
MCH: 31.9 pg (ref 26.0–34.0)
MCHC: 33.6 g/dL (ref 30.0–36.0)
Monocytes Absolute: 0.4 10*3/uL (ref 0.1–1.0)
Monocytes Relative: 2 % — ABNORMAL LOW (ref 3–12)
RDW: 14.2 % (ref 11.5–15.5)

## 2013-03-05 LAB — OCCULT BLOOD GASTRIC / DUODENUM (SPECIMEN CUP)
Occult Blood, Gastric: POSITIVE — AB
pH, Gastric: 4

## 2013-03-05 LAB — TYPE AND SCREEN: Antibody Screen: NEGATIVE

## 2013-03-05 LAB — PROTIME-INR
INR: 1.23 (ref 0.00–1.49)
INR: 1.11 (ref 0.00–1.49)
Prothrombin Time: 14.1 seconds (ref 11.6–15.2)

## 2013-03-05 LAB — CBC
MCH: 33.2 pg (ref 26.0–34.0)
MCHC: 35.1 g/dL (ref 30.0–36.0)
Platelets: 215 10*3/uL (ref 150–400)

## 2013-03-05 LAB — HEPATIC FUNCTION PANEL
ALT: 13 U/L (ref 0–35)
AST: 20 U/L (ref 0–37)
Alkaline Phosphatase: 75 U/L (ref 39–117)
Bilirubin, Direct: 0.2 mg/dL (ref 0.0–0.3)
Indirect Bilirubin: 0.7 mg/dL (ref 0.3–0.9)

## 2013-03-05 LAB — BASIC METABOLIC PANEL
BUN: 24 mg/dL — ABNORMAL HIGH (ref 6–23)
Calcium: 10.8 mg/dL — ABNORMAL HIGH (ref 8.4–10.5)
Creatinine, Ser: 0.83 mg/dL (ref 0.50–1.10)
GFR calc Af Amer: 71 mL/min — ABNORMAL LOW (ref >90)
GFR calc non Af Amer: 61 mL/min — ABNORMAL LOW (ref >90)

## 2013-03-05 LAB — TROPONIN I: Troponin I: <0.3 ng/mL (ref <0.30)

## 2013-03-05 LAB — URINE MICROSCOPIC-ADD ON

## 2013-03-05 LAB — MRSA PCR SCREENING: MRSA by PCR: NEGATIVE

## 2013-03-05 MED ORDER — PROTHROMBIN COMPLEX CONC HUMAN 500 UNITS IV KIT
1676.0000 [IU] | PACK | Status: AC
  Administered 2013-03-05: 1676 [IU] via INTRAVENOUS
  Filled 2013-03-05: qty 67

## 2013-03-05 MED ORDER — LEVALBUTEROL HCL 0.63 MG/3ML IN NEBU: 0.6300 mg | INHALATION_SOLUTION | RESPIRATORY_TRACT | Status: DC | PRN

## 2013-03-05 MED ORDER — DILTIAZEM HCL 100 MG IV SOLR
5.0000 mg/h | INTRAVENOUS | Status: DC
  Administered 2013-03-05: 5 mg/h via INTRAVENOUS
  Administered 2013-03-06: 10 mg/h via INTRAVENOUS
  Administered 2013-03-06 – 2013-03-08 (×3): 5 mg/h via INTRAVENOUS
  Filled 2013-03-05 (×6): qty 100

## 2013-03-05 MED ORDER — ONDANSETRON HCL 4 MG PO TABS: 4.0000 mg | ORAL_TABLET | Freq: Four times a day (QID) | ORAL | Status: DC | PRN

## 2013-03-05 MED ORDER — VITAMIN K1 10 MG/ML IJ SOLN
INTRAMUSCULAR | Status: AC
  Administered 2013-03-05: 10 mg via INTRAVASCULAR
  Filled 2013-03-05: qty 1

## 2013-03-05 MED ORDER — SODIUM CHLORIDE 0.9 % IV SOLN
INTRAVENOUS | Status: DC
  Administered 2013-03-09: 15:00:00 via INTRAVENOUS

## 2013-03-05 MED ORDER — DEXTROSE 5 % IV SOLN
1.0000 g | INTRAVENOUS | Status: DC
  Administered 2013-03-05 – 2013-03-07 (×2): 1 g via INTRAVENOUS
  Filled 2013-03-05 (×4): qty 10

## 2013-03-05 MED ORDER — ACETAMINOPHEN 650 MG RE SUPP: 650.0000 mg | Freq: Four times a day (QID) | RECTAL | Status: DC | PRN

## 2013-03-05 MED ORDER — SODIUM CHLORIDE 0.9 % IV SOLN
80.0000 mg | Freq: Once | INTRAVENOUS | Status: AC
  Administered 2013-03-05: 80 mg via INTRAVENOUS
  Filled 2013-03-05: qty 80

## 2013-03-05 MED ORDER — LEVOFLOXACIN IN D5W 750 MG/150ML IV SOLN
750.0000 mg | Freq: Once | INTRAVENOUS | Status: AC
  Administered 2013-03-05: 750 mg via INTRAVENOUS
  Filled 2013-03-05: qty 150

## 2013-03-05 MED ORDER — SODIUM CHLORIDE 0.9 % IV SOLN
INTRAVENOUS | Status: DC
  Administered 2013-03-05: 100 mL/h via INTRAVENOUS

## 2013-03-05 MED ORDER — SODIUM CHLORIDE 0.9 % IV SOLN
8.0000 mg/h | INTRAVENOUS | Status: DC
  Administered 2013-03-05 – 2013-03-06 (×2): 8 mg/h via INTRAVENOUS
  Filled 2013-03-05 (×8): qty 80

## 2013-03-05 MED ORDER — PANTOPRAZOLE SODIUM 40 MG IV SOLR
INTRAVENOUS | Status: AC
  Filled 2013-03-05: qty 80

## 2013-03-05 MED ORDER — ONDANSETRON HCL 4 MG/2ML IJ SOLN
4.0000 mg | Freq: Four times a day (QID) | INTRAMUSCULAR | Status: DC | PRN
  Administered 2013-03-05: 4 mg via INTRAVENOUS
  Filled 2013-03-05 (×2): qty 2

## 2013-03-05 MED ORDER — DEXTROSE 5 % IV SOLN
500.0000 mg | INTRAVENOUS | Status: DC
  Administered 2013-03-05 – 2013-03-07 (×3): 500 mg via INTRAVENOUS
  Filled 2013-03-05 (×5): qty 500

## 2013-03-05 MED ORDER — VITAMIN K1 10 MG/ML IJ SOLN
10.0000 mg | INTRAVENOUS | Status: AC
  Filled 2013-03-05: qty 1

## 2013-03-05 MED ORDER — LEVALBUTEROL HCL 0.63 MG/3ML IN NEBU
0.6300 mg | INHALATION_SOLUTION | Freq: Four times a day (QID) | RESPIRATORY_TRACT | Status: DC
  Administered 2013-03-05 – 2013-03-06 (×4): 0.63 mg via RESPIRATORY_TRACT
  Filled 2013-03-05 (×6): qty 3

## 2013-03-05 MED ORDER — ACETAMINOPHEN 325 MG PO TABS
650.0000 mg | ORAL_TABLET | Freq: Four times a day (QID) | ORAL | Status: DC | PRN
  Administered 2013-03-07 – 2013-03-09 (×2): 650 mg via ORAL
  Filled 2013-03-05 (×2): qty 2

## 2013-03-05 NOTE — ED Notes (Addendum)
O2 sats 88-89 on room air.  Placed [place] O2 at 3L with sats rising to 94%.  Abdomen slightly firm to palpation , but w/out c/o pain or bloating.  States she has had occasional slight nausea since fall.

## 2013-03-05 NOTE — Consult Note (Addendum)
Unassigned patient Heritage manager for [name] Reason for Consult: ?Hematemesis. Referring Physician: [facility]-Dr. Ricke Hey.  [name] is an 79 y.o. female.  HPI: 79 year old whiote female, routinely followed by Dr. Jena Gauss, transferred to [facility] [as there was no GI call coverage on the weekend] presents with a 2 day history of melena with CGE. She claims she has been "throwing up black stuff for 2 day along with black stools". She denies having any abdominal pain. She has had a fairly good appetite prior to this hand her weight has been stable.   Past Medical History  Diagnosis Date  . GERD (gastroesophageal reflux disease)        . Hyperlipidemia   . Hypertension   . Osteoarthritis of lumbar spine   . PONV (postoperative nausea and vomiting) 2009    after total hip  . Deviated nasal septum     pt had septoplasty  . Nasal turbinate hypertrophy     bilateral  . Cancer     nasal cancer  . Cancer     cancer in the nose and in lower jaw  . Atrial fibrillation   . Dizziness   . Itching   . Diffuse large B cell lymphoma     Dr. Mariel Sleet -Nasal cavity, right sided lymph adenopathy  . OAB (overactive bladder)   . 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 Total hip arthroplasty  2009    Right, [facility] (general)  . Bunionectomy  20+yrs ago    bilateral, [facility]  . Hammer toe surgery      20+ yrs ago  . Cataract extraction w/ intraocular lens  implant, bilateral  2010    [facility]  . Nasal septoplasty w/ turbinoplasty  12/18/2010    Procedure: NASAL SEPTOPLASTY WITH TURBINATE REDUCTION;  Surgeon: Darletta Moll;  Location: [facility];  Service: ENT;  Laterality: Bilateral;  . Colon surgery  10/29/2008    Ingram-right hemicolectomy for tubulovillous adenoma (3cm) with HG glandular dysplasia  . Portacath placement  02/10/2011    Procedure: INSERTION PORT-A-CATH;  Surgeon: Marlane Hatcher;  Location: [facility];  Service: General;   Laterality: Left;  left subclavian  . Skin cancer removal  6/13  . Colonoscopy  09/28/2002    RMR: Normal colonic mucosa.  Redundant and elongated but otherwise normal appearing colon/normal rectum  . Colonoscopy  09/11/2008    RMR: Normal rectum/Diminutive sigmoid polyp status post snare removal/  Flat sessile semilunar lesion at and in the ileocecal valve status post hot snare piecemeal debulking as described above  . Colonoscopy  01/06/10    Rourk-normal rectum, normal residual colon status post right hemicolectomy. Next colonoscopy in 01/2015 health permiting  . Esophagogastroduodenoscopy (egd) with esophageal dilation  05/23/2012    ZOX:[id number] ring-dilated as described above. Hiatal hernia   Family History  Problem Relation Age of Onset  . Pseudochol deficiency Neg Hx   . Malignant hyperthermia Neg Hx   . Hypotension Neg Hx   . Anesthesia problems Neg Hx   . Cancer Mother   . Stroke Father    Social History:  reports that she has never smoked. She has never used smokeless tobacco. She reports that she does not drink alcohol or use illicit drugs.  Allergies:  Allergies  Allergen Reactions  . Statins Other (See Comments)    Causes leg cramps and muscle weakness   Medications: I have reviewed  the patient's current medications.  Results for orders placed during the hospital encounter of 03/05/13 (from the past 48 hour(s))  CBC WITH DIFFERENTIAL     Status: Abnormal   Collection Time    03/05/13  8:56 AM      Result Value Range   WBC 24.5 (*) 4.0 - 10.5 K/uL   RBC 4.39  3.87 - 5.11 MIL/uL   Hemoglobin 14.0  12.0 - 15.0 g/dL   HCT 40.9  81.1 - 91.4 %   MCV 95.0  78.0 - 100.0 fL   MCH 31.9  26.0 - 34.0 pg   MCHC 33.6  30.0 - 36.0 g/dL   RDW 78.2  95.6 - 21.3 %   Platelets 231  150 - 400 K/uL   Neutrophils Relative % 95 (*) 43 - 77 %   Neutro Abs 23.3 (*) 1.7 - 7.7 K/uL   Lymphocytes Relative 3 (*) 12 - 46 %   Lymphs Abs 0.7  0.7 - 4.0 K/uL   Monocytes Relative 2 (*) 3 -  12 %   Monocytes Absolute 0.4  0.1 - 1.0 K/uL   Eosinophils Relative 0  0 - 5 %   Eosinophils Absolute 0.0  0.0 - 0.7 K/uL   Basophils Relative 0  0 - 1 %   Basophils Absolute 0.0  0.0 - 0.1 K/uL  BASIC METABOLIC PANEL     Status: Abnormal   Collection Time    03/05/13  8:56 AM      Result Value Range   Sodium 137  135 - 145 mEq/L   Potassium 4.0  3.5 - 5.1 mEq/L   Chloride 96  96 - 112 mEq/L   CO2 26  19 - 32 mEq/L   Glucose, Bld 150 (*) 70 - 99 mg/dL   BUN 24 (*) 6 - 23 mg/dL   Creatinine, Ser 0.86  0.50 - 1.10 mg/dL   Calcium 57.8 (*) 8.4 - 10.5 mg/dL   GFR calc non Af Amer 61 (*) >90 mL/min   GFR calc Af Amer 71 (*) >90 mL/min   Comment: (NOTE)     The eGFR has been calculated using the CKD EPI equation.     This calculation has not been validated in all clinical situations.     eGFR's persistently <90 mL/min signify possible Chronic Kidney     Disease.  TROPONIN I     Status: None   Collection Time    03/05/13  8:56 AM      Result Value Range   Troponin I <0.30  <0.30 ng/mL   Comment:            Due to the release kinetics of cTnI,     a negative result within the first hours     of the onset of symptoms does not rule out     myocardial infarction with certainty.     If myocardial infarction is still suspected,     repeat the test at appropriate intervals.  PROTIME-INR     Status: Abnormal   Collection Time    03/05/13  8:56 AM      Result Value Range   Prothrombin Time 23.3 (*) 11.6 - 15.2 seconds   INR 2.15 (*) 0.00 - 1.49  APTT     Status: Abnormal   Collection Time    03/05/13  9:26 AM      Result Value Range   aPTT 45 (*) 24 - 37 seconds   Comment:  IF BASELINE aPTT IS ELEVATED,     SUGGEST PATIENT RISK ASSESSMENT     BE USED TO DETERMINE APPROPRIATE     ANTICOAGULANT THERAPY.  PRO B NATRIURETIC PEPTIDE     Status: None   Collection Time    03/05/13  9:26 AM      Result Value Range   Pro B Natriuretic peptide (BNP) 163.7  0 - 450 pg/mL   HEPATIC FUNCTION PANEL     Status: None   Collection Time    03/05/13  9:30 AM      Result Value Range   Total Protein 6.6  6.0 - 8.3 g/dL   Albumin 4.0  3.5 - 5.2 g/dL   AST 20  0 - 37 U/L   ALT 13  0 - 35 U/L   Alkaline Phosphatase 75  39 - 117 U/L   Total Bilirubin 0.9  0.3 - 1.2 mg/dL   Bilirubin, Direct 0.2  0.0 - 0.3 mg/dL   Indirect Bilirubin 0.7  0.3 - 0.9 mg/dL  URINALYSIS, ROUTINE W REFLEX MICROSCOPIC     Status: Abnormal   Collection Time    03/05/13 10:37 AM      Result Value Range   Color, Urine YELLOW  YELLOW   APPearance CLEAR  CLEAR   Specific Gravity, Urine >1.030 (*) 1.005 - 1.030   pH 5.5  5.0 - 8.0   Glucose, UA NEGATIVE  NEGATIVE mg/dL   Hgb urine dipstick NEGATIVE  NEGATIVE   Bilirubin Urine NEGATIVE  NEGATIVE   Ketones, ur NEGATIVE  NEGATIVE mg/dL   Protein, ur TRACE (*) NEGATIVE mg/dL   Urobilinogen, UA 0.2  0.0 - 1.0 mg/dL   Nitrite NEGATIVE  NEGATIVE   Leukocytes, UA NEGATIVE  NEGATIVE  URINE MICROSCOPIC-ADD ON     Status: None   Collection Time    03/05/13 10:37 AM      Result Value Range   Squamous Epithelial / LPF RARE  RARE   WBC, UA 0-2  <3 WBC/hpf   RBC / HPF 0-2  <3 RBC/hpf   Bacteria, UA RARE  RARE   Urine-Other MUCOUS PRESENT    OCCULT BLOOD GASTRIC / DUODENUM (SPECIMEN CUP)     Status: Abnormal   Collection Time    03/05/13  1:16 PM      Result Value Range   pH, Gastric 4     Occult Blood, Gastric POSITIVE (*) NEGATIVE  PROTIME-INR     Status: None   Collection Time    03/05/13  3:10 PM      Result Value Range   Prothrombin Time 15.2  11.6 - 15.2 seconds   INR 1.23  0.00 - 1.49  TROPONIN I     Status: None   Collection Time    03/05/13  3:10 PM      Result Value Range   Troponin I <0.30  <0.30 ng/mL   Comment:            Due to the release kinetics of cTnI,     a negative result within the first hours     of the onset of symptoms does not rule out     myocardial infarction with certainty.     If myocardial infarction is  still suspected,     repeat the test at appropriate intervals.  CULTURE, BLOOD (ROUTINE X 2)     Status: None   Collection Time    03/05/13  3:44 PM  Result Value Range   Specimen Description       Value: BLOOD BLOOD RIGHT HAND BOTTLES DRAWN AEROBIC AND ANAEROBIC   Special Requests 10CC     Culture PENDING     Report Status PENDING    CULTURE, BLOOD (ROUTINE X 2)     Status: None   Collection Time    03/05/13  3:44 PM      Result Value Range   Specimen Description       Value: BLOOD BLOOD LEFT HAND BOTTLES DRAWN AEROBIC AND ANAEROBIC   Special Requests 10CC     Culture PENDING     Report Status PENDING    MRSA PCR SCREENING     Status: None   Collection Time    03/05/13  5:56 PM      Result Value Range   MRSA by PCR NEGATIVE  NEGATIVE   Comment:            The GeneXpert MRSA Assay (FDA     approved for NASAL specimens     only), is one component of a     comprehensive MRSA colonization     surveillance program. It is not     intended to diagnose MRSA     infection nor to guide or     monitor treatment for     MRSA infections.  CBC     Status: None   Collection Time    03/05/13  9:00 PM      Result Value Range   WBC PENDING  4.0 - 10.5 K/uL   RBC 3.92  3.87 - 5.11 MIL/uL   Hemoglobin 13.0  12.0 - 15.0 g/dL   HCT 13.2  44.0 - 10.2 %   MCV 94.4  78.0 - 100.0 fL   MCH 33.2  26.0 - 34.0 pg   MCHC 35.1  30.0 - 36.0 g/dL   RDW 72.5  36.6 - 44.0 %   Platelets 215  150 - 400 K/uL  PROTIME-INR     Status: None   Collection Time    03/05/13  9:00 PM      Result Value Range   Prothrombin Time 14.1  11.6 - 15.2 seconds   INR 1.11  0.00 - 1.49    Dg Chest 1 View  03/05/2013   CLINICAL DATA:  Larey Seat 2 days ago, anterior right shoulder and right humeral pain, initial encounter  EXAM: CHEST - 1 VIEW  COMPARISON:  02/20/2013  FINDINGS: Right subclavian sequential transvenous pacemaker leads project at right atrium and right ventricle.  Enlargement of cardiac silhouette.   Atherosclerotic calcification aorta.  Mediastinal contours and pulmonary vascularity normal.  Emphysematous and bronchitic changes compatible with COPD.  Question mild patchy infiltrate in lower left lung, predominately lower lobe.  Remaining lungs clear.  No pleural effusion or pneumothorax.  Bones demineralized.  IMPRESSION: Enlargement of cardiac silhouette.  Question COPD changes with mild patchy left basilar infiltrate predominately left lower lobe.   Electronically Signed   By: Ulyses Southward M.D.   On: 03/05/2013 10:04   Dg Shoulder Right  03/05/2013   CLINICAL DATA:  Right shoulder in humeral pain post fall 2 days ago, initial encounter  EXAM: RIGHT SHOULDER - 2+ VIEW  COMPARISON:  None  FINDINGS: Osseous demineralization.  AC joint alignment normal with cranial spur formation.  Narrowed acromiohumeral interval, which may predispose the patient to rotator cuff pathology.  No acute fracture, dislocation, or bone destruction.  Visualized right ribs intact.  Pacemaker generator right chest wall.  IMPRESSION: Osseous demineralization with mild AC joint degenerative changes.  No acute abnormalities.   Electronically Signed   By: Ulyses Southward M.D.   On: 03/05/2013 10:06   Dg Humerus Right  03/05/2013   CLINICAL DATA:  Pain right humerus and shoulder post fall 2 days ago, initial encounter  EXAM: RIGHT HUMERUS - 2+ VIEW  COMPARISON:  None  FINDINGS: Osseous demineralization.  Shoulder and elbow joint alignments grossly normal.  No acute fracture, dislocation, or bone destruction.  IMPRESSION: No acute osseous abnormalities.   Electronically Signed   By: Ulyses Southward M.D.   On: 03/05/2013 10:07   Review of Systems  Constitutional: Positive for malaise/fatigue. Negative for fever, chills and weight loss.  HENT: Negative.   Eyes: Negative.  Negative for blurred vision.  Respiratory: Negative.   Cardiovascular: Negative.   Gastrointestinal: Positive for heartburn, nausea, vomiting, diarrhea and melena.  Negative for abdominal pain and constipation.  Genitourinary: Negative.   Musculoskeletal: Positive for joint pain.  Skin: Negative.   Neurological: Positive for weakness.   Blood pressure 111/45, pulse 72, temperature 97.7 F (36.5 C), temperature source Oral, resp. rate 21, height 5\' 6"  (1.676 m), weight 63.504 kg (140 lb), SpO2 94.00%. Physical Exam  Constitutional: She appears well-developed and well-nourished.  HENT:  Head: Normocephalic and atraumatic.  Cardiovascular:  S 1 ans S2 are irregular  Respiratory: Breath sounds normal. No respiratory distress.  GI: Soft. Normal appearance. There is no hepatosplenomegaly. There is no tenderness. There is no rebound and no CVA tenderness.  Skin: Skin is warm, dry and intact.  Psychiatric: She has a normal mood and affect. Her speech is normal and behavior is normal. Judgment and thought content normal. Cognition and memory are normal.   Assessment/Plan: *1)  Coffee ground emesis for 2 days with melenic stools [on Coumadin-currently on hold]: Will plan an EGD tomorrow-?PUD vs Diuelafoy's vs AVM. Continue serial CBC's.  2) GERD-on PPI's.Marland Kitchen  3) Atrial fibrillation with RVR on Cardizem drip; was on Coumadin PTA. 4) HTN/Hyperlipidemia.  5) History of B cell lymphoma of the nasal cavity.  6) S/P Partial colectomy for a large TVA.Marland Kitchen  7) Leukocytosis on the morning CBC-repeat WBC on CBC this evening is pending.Marland Kitchen  Catherina Pates 03/05/2013, 10:08 PM

## 2013-03-05 NOTE — ED Provider Notes (Addendum)
CSN: [id number]     Arrival date & time 03/05/13  4098 History  This chart was scribed for Ward Givens, MD by Blanchard Kelch, ED Scribe. The patient was seen in room APA10/APA10. Patient's care was started at 8:48 AM.    Chief Complaint  Patient presents with  . Shortness of Breath    Patient is a 79 y.o. female presenting with shortness of breath. The history is provided by the patient. No language interpreter was used.  Shortness of Breath Associated symptoms: diaphoresis and vomiting   Associated symptoms: no abdominal pain and no chest pain     HPI Comments: [name] is a 79 y.o. female who presents to the Emergency Department due to a fall that occurred two days ago. She lost her balance getting clothes out of a dryer and fell backwards on her right side, where she had a pacemaker implanted on 9/11. She denies hitting her head or any episodes of syncope. She denies any injuries. She reports baseline pain in her right arm that worsened after the fall. Her family reports that she was vomiting immediately after the fall. They also report a decreased range of motion as well as worsening weakness in the right arm. She had an episode of constant emesis this morning that produced black contents. She complains of nausea prior to the episode of emesis as well as lack of appetite the past couple of days. She complains of intermittent shortness of breath with walking, leg swelling, and diaphoresis. She denies abdominal pain currently or during the episode of emesis, melena, or chest pain. Her INR was last checked on 9/23 and it was 1.8, but is currently back on her regular schedule of coumadin. She has a pertinent medical history of GERD. She has a past surgical history of esophageal dilation that was done a few months ago by [name] and it also showed a mild ulcer. She does not smoke. She has a caregiver that stays with her several nights a week.   Her PCP is Dr. Sherril Croon.  She does not have an  Orthopedist. Cardiology [facility] [name]  Past Medical History  Diagnosis Date  . GERD (gastroesophageal reflux disease)        . Hyperlipidemia   . Hypertension   . Osteoarthritis of lumbar spine   . PONV (postoperative nausea and vomiting) 2009    after total hip  . Deviated nasal septum     pt had septoplasty  . Nasal turbinate hypertrophy     bilateral  . Cancer     nasal cancer  . Cancer     cancer in the nose and in lower jaw  . Atrial fibrillation   . Dizziness   . Itching   . Diffuse large B cell lymphoma     Dr. Mariel Sleet -Nasal cavity, right sided lymph adenopathy  . OAB (overactive bladder)   . 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 Total hip arthroplasty  2009    Right, [facility] (general)  . Bunionectomy  20+yrs ago    bilateral, [facility]  . Hammer toe surgery      20+ yrs ago  . Cataract extraction w/ intraocular lens  implant, bilateral  2010    [facility]  . Nasal septoplasty w/ turbinoplasty  12/18/2010    Procedure: NASAL SEPTOPLASTY WITH TURBINATE REDUCTION;  Surgeon: Darletta Moll;  Location: [facility];  Service:  ENT;  Laterality: Bilateral;  . Colon surgery  10/29/2008    Ingram-right hemicolectomy for tubulovillous adenoma (3cm) with HG glandular dysplasia  . Portacath placement  02/10/2011    Procedure: INSERTION PORT-A-CATH;  Surgeon: Marlane Hatcher;  Location: [facility];  Service: General;  Laterality: Left;  left subclavian  . Skin cancer removal  6/13  . Colonoscopy  09/28/2002    RMR: Normal colonic mucosa.  Redundant and elongated but otherwise normal appearing colon/normal rectum  . Colonoscopy  09/11/2008    RMR: Normal rectum/Diminutive sigmoid polyp status post snare removal/  Flat sessile semilunar lesion at and in the ileocecal valve status post hot snare piecemeal debulking as described above  . Colonoscopy  01/06/10    Rourk-normal rectum, normal residual colon status post right  hemicolectomy. Next colonoscopy in 01/2015 health permiting  . Esophagogastroduodenoscopy (egd) with esophageal dilation  05/23/2012    RUE:[id number] ring-dilated as described above. Hiatal hernia   Family History  Problem Relation Age of Onset  . Pseudochol deficiency Neg Hx   . Malignant hyperthermia Neg Hx   . Hypotension Neg Hx   . Anesthesia problems Neg Hx   . Cancer Mother   . Stroke Father    History  Substance Use Topics  . Smoking status: Never Smoker   . Smokeless tobacco: Never Used  . Alcohol Use: No   Lives at home Lives alone Has a caregiver some nights  OB History   Grav Para Term Preterm Abortions TAB SAB Ect Mult Living                 Review of Systems  Constitutional: Positive for diaphoresis and appetite change.  Respiratory: Positive for shortness of breath.   Cardiovascular: Positive for leg swelling. Negative for chest pain.  Gastrointestinal: Positive for nausea and vomiting. Negative for abdominal pain and blood in stool.  Musculoskeletal: Positive for myalgias.  Neurological: Positive for weakness. Negative for syncope.  All other systems reviewed and are negative.    Allergies  Statins  Home Medications   Current Outpatient Rx  Name  Route  Sig  Dispense  Refill  . amLODipine (NORVASC) 5 MG tablet   Oral   Take 1 tablet (5 mg total) by mouth daily.   180 tablet   1   . Calcium-Vitamin D (CALTRATE 600 PLUS-VIT D PO)   Oral   Take 1 tablet by mouth daily.         . cephALEXin (KEFLEX) 500 MG capsule   Oral   Take 500 mg by mouth 3 (three) times daily.         . clobetasol cream (TEMOVATE) 0.05 %   Topical   Apply 1 application topically 2 (two) times daily.          Marland Kitchen dexlansoprazole (DEXILANT) 60 MG capsule   Oral   Take 60 mg by mouth daily.         . Multiple Vitamins-Minerals (CENTRUM SILVER ULTRA WOMENS) TABS   Oral   Take 1 tablet by mouth daily.           Marland Kitchen warfarin (COUMADIN) 4 MG tablet   Oral    Take 2-4 mg by mouth daily. Patient takes 2mg  on Mon.,Wed.,Fri.,and 4mg  all other days.          Triage Vitals: BP 143/63  Pulse 88  Temp(Src) 97.8 F (36.6 C) (Oral)  Resp 21  Ht 5\' 6"  (1.676 m)  Wt 140 lb (63.504 kg)  BMI  22.61 kg/m2  SpO2 95%  Vital signs normal    Physical Exam  Vitals reviewed. Constitutional: She is oriented to person, place, and time. She appears well-developed and well-nourished.  Non-toxic appearance. She does not appear ill. No distress.  HENT:  Head: Normocephalic and atraumatic.  Right Ear: External ear normal.  Left Ear: External ear normal.  Nose: Nose normal. No mucosal edema or rhinorrhea.  Mouth/Throat: Oropharynx is clear and moist and mucous membranes are normal. No dental abscesses or edematous.  Eyes: Conjunctivae and EOM are normal. Pupils are equal, round, and reactive to light.  Neck: Normal range of motion and full passive range of motion without pain. Neck supple.  Cardiovascular: Normal rate, regular rhythm and normal heart sounds.  Exam reveals no gallop and no friction rub.   No murmur heard. Pulmonary/Chest: Effort normal and breath sounds normal. No respiratory distress. She has no wheezes. She has no rhonchi. She has no rales. She exhibits no tenderness and no crepitus.  Abdominal: Soft. Normal appearance and bowel sounds are normal. She exhibits no distension. There is no tenderness. There is no rebound and no guarding.  Genitourinary:  Chaperone present for rectal exam, notable for no external hemorrhoids, no stool in vault and yellow stool present on glove.  Musculoskeletal: She exhibits no edema.  Less than 5 degree range of motion in right shoulder with adduction and forward flexion.  Neurological: She is alert and oriented to person, place, and time. She has normal strength. No cranial nerve deficit.  Skin: Skin is warm, dry and intact. No rash noted. No erythema. No pallor.  Pacemaker insertion site in right chest healing  well.   Psychiatric: She has a normal mood and affect. Her speech is normal and behavior is normal. Her mood appears not anxious.    ED Course  Procedures (including critical care time)  Medications  levofloxacin (LEVAQUIN) IVPB 750 mg (750 mg Intravenous New Bag/Given 03/05/13 1231)  prothrombin complex conc human (KCENTRA) IVPB 1,600 Units (not administered)  phytonadione (VITAMIN K) 10 mg in dextrose 5 % 50 mL IVPB (not administered)     DIAGNOSTIC STUDIES: Oxygen Saturation is 95% on room air, adequate by my interpretation.    COORDINATION OF CARE:  9:24 AM -Clinical suspicion of frozen shoulder. Will order EKG, chest, shoulder and right humerus x-ray, CBC, BMP, Troponin, and Protime-INR. Patient verbalizes understanding and agrees with treatment plan.  12:02 PM- recheck with patient. Discussed lab results with patient and family which show dehydration but no infection, no signs of CHF, but possible signs of pneumonia in left lower lung. Patient's family states she was given Keflex on her last ED visit on 9/15 for possible pneumonia and just finished the dosage yesterday without relief of symptoms. Patient's family would like her to be admitted. I will consult with Hospitalist about admission. Pt did have hypoxia with pulse ox 89% on RA after arriving to the ED.   12:23 PM- Consult with Dr. Rennis Chris to determine treatment plan. Admit to Team 1 to telemetry.   1:08 PM- Called in because patient vomited up black contents. She reports feeling nauseous prior to episode. Will test contents for blood. Pt has coffee ground material on her front of her gown. BP good, but HR is 130's. PT seems anxious  13:12 Dr Kerry Hough notified, he wants to change her bed to step down  13:40 Pt started on Vit K and kcentra for her therapeutic INR on coumadin and GI bleeding (+ gastroccult).  Medications  levofloxacin (LEVAQUIN) IVPB 750 mg (750 mg Intravenous New Bag/Given 03/05/13 1231)  prothrombin complex  conc human (KCENTRA) IVPB 1,600 Units (not administered)  phytonadione (VITAMIN K) 10 mg in dextrose 5 % 50 mL IVPB (not administered)    Results for orders placed during the hospital encounter of 03/05/13  CBC WITH DIFFERENTIAL      Result Value Range   WBC 24.5 (*) 4.0 - 10.5 K/uL   RBC 4.39  3.87 - 5.11 MIL/uL   Hemoglobin 14.0  12.0 - 15.0 g/dL   HCT 11.9  14.7 - 82.9 %   MCV 95.0  78.0 - 100.0 fL   MCH 31.9  26.0 - 34.0 pg   MCHC 33.6  30.0 - 36.0 g/dL   RDW 56.2  13.0 - 86.5 %   Platelets 231  150 - 400 K/uL   Neutrophils Relative % 95 (*) 43 - 77 %   Neutro Abs 23.3 (*) 1.7 - 7.7 K/uL   Lymphocytes Relative 3 (*) 12 - 46 %   Lymphs Abs 0.7  0.7 - 4.0 K/uL   Monocytes Relative 2 (*) 3 - 12 %   Monocytes Absolute 0.4  0.1 - 1.0 K/uL   Eosinophils Relative 0  0 - 5 %   Eosinophils Absolute 0.0  0.0 - 0.7 K/uL   Basophils Relative 0  0 - 1 %   Basophils Absolute 0.0  0.0 - 0.1 K/uL  BASIC METABOLIC PANEL      Result Value Range   Sodium 137  135 - 145 mEq/L   Potassium 4.0  3.5 - 5.1 mEq/L   Chloride 96  96 - 112 mEq/L   CO2 26  19 - 32 mEq/L   Glucose, Bld 150 (*) 70 - 99 mg/dL   BUN 24 (*) 6 - 23 mg/dL   Creatinine, Ser 7.84  0.50 - 1.10 mg/dL   Calcium 69.6 (*) 8.4 - 10.5 mg/dL   GFR calc non Af Amer 61 (*) >90 mL/min   GFR calc Af Amer 71 (*) >90 mL/min  TROPONIN I      Result Value Range   Troponin I <0.30  <0.30 ng/mL  PROTIME-INR      Result Value Range   Prothrombin Time 23.3 (*) 11.6 - 15.2 seconds   INR 2.15 (*) 0.00 - 1.49  APTT      Result Value Range   aPTT 45 (*) 24 - 37 seconds  PRO B NATRIURETIC PEPTIDE      Result Value Range   Pro B Natriuretic peptide (BNP) 163.7  0 - 450 pg/mL  URINALYSIS, ROUTINE W REFLEX MICROSCOPIC      Result Value Range   Color, Urine YELLOW  YELLOW   APPearance CLEAR  CLEAR   Specific Gravity, Urine >1.030 (*) 1.005 - 1.030   pH 5.5  5.0 - 8.0   Glucose, UA NEGATIVE  NEGATIVE mg/dL   Hgb urine dipstick NEGATIVE   NEGATIVE   Bilirubin Urine NEGATIVE  NEGATIVE   Ketones, ur NEGATIVE  NEGATIVE mg/dL   Protein, ur TRACE (*) NEGATIVE mg/dL   Urobilinogen, UA 0.2  0.0 - 1.0 mg/dL   Nitrite NEGATIVE  NEGATIVE   Leukocytes, UA NEGATIVE  NEGATIVE  HEPATIC FUNCTION PANEL      Result Value Range   Total Protein 6.6  6.0 - 8.3 g/dL   Albumin 4.0  3.5 - 5.2 g/dL   AST 20  0 - 37 U/L   ALT 13  0 - 35 U/L   Alkaline Phosphatase 75  39 - 117 U/L   Total Bilirubin 0.9  0.3 - 1.2 mg/dL   Bilirubin, Direct 0.2  0.0 - 0.3 mg/dL   Indirect Bilirubin 0.7  0.3 - 0.9 mg/dL  URINE MICROSCOPIC-ADD ON      Result Value Range   Squamous Epithelial / LPF RARE  RARE   WBC, UA 0-2  <3 WBC/hpf   RBC / HPF 0-2  <3 RBC/hpf   Bacteria, UA RARE  RARE   Urine-Other MUCOUS PRESENT    OCCULT BLOOD GASTRIC / DUODENUM (SPECIMEN CUP)      Result Value Range   pH, Gastric 4     Occult Blood, Gastric POSITIVE (*) NEGATIVE   Laboratory interpretation all normal except concentrated UA c/w dehydration, leukocytosis, therapeutic INR, + Gastroccult   Dg Chest 1 View  03/05/2013   CLINICAL DATA:  Larey Seat 2 days ago, anterior right shoulder and right humeral pain, initial encounter  EXAM: CHEST - 1 VIEW  COMPARISON:  02/20/2013  FINDINGS: Right subclavian sequential transvenous pacemaker leads project at right atrium and right ventricle.  Enlargement of cardiac silhouette.  Atherosclerotic calcification aorta.  Mediastinal contours and pulmonary vascularity normal.  Emphysematous and bronchitic changes compatible with COPD.  Question mild patchy infiltrate in lower left lung, predominately lower lobe.  Remaining lungs clear.  No pleural effusion or pneumothorax.  Bones demineralized.  IMPRESSION: Enlargement of cardiac silhouette.  Question COPD changes with mild patchy left basilar infiltrate predominately left lower lobe.   Electronically Signed   By: Ulyses Southward M.D.   On: 03/05/2013 10:04   Dg Shoulder Right  03/05/2013   CLINICAL DATA:   Right shoulder in humeral pain post fall 2 days ago, initial encounter  EXAM: RIGHT SHOULDER - 2+ VIEW  COMPARISON:  None  FINDINGS: Osseous demineralization.  AC joint alignment normal with cranial spur formation.  Narrowed acromiohumeral interval, which may predispose the patient to rotator cuff pathology.  No acute fracture, dislocation, or bone destruction.  Visualized right ribs intact.  Pacemaker generator right chest wall.  IMPRESSION: Osseous demineralization with mild AC joint degenerative changes.  No acute abnormalities.   Electronically Signed   By: Ulyses Southward M.D.   On: 03/05/2013 10:06   Dg Humerus Right  03/05/2013   CLINICAL DATA:  Pain right humerus and shoulder post fall 2 days ago, initial encounter  EXAM: RIGHT HUMERUS - 2+ VIEW  COMPARISON:  None  FINDINGS: Osseous demineralization.  Shoulder and elbow joint alignments grossly normal.  No acute fracture, dislocation, or bone destruction.  IMPRESSION: No acute osseous abnormalities.   Electronically Signed   By: Ulyses Southward M.D.   On: 03/05/2013 10:07     MDM   1. CAP (community acquired pneumonia)   2. Leukocytosis   3. Adhesions of shoulder joint, right   4. Dehydration   5. Vomiting   6. Hematemesis   7. Hypoxia    Plan admission  CRITICAL CARE Performed by: Devoria Albe L Total critical care time: 32 min Critical care time was exclusive of separately billable procedures and treating other patients. Critical care was necessary to treat or prevent imminent or life-threatening deterioration. Critical care was time spent personally by me on the following activities: development of treatment plan with patient and/or surrogate as well as nursing, discussions with consultants, evaluation of patient's response to treatment, examination of patient, obtaining history from patient or surrogate, ordering and performing treatments and interventions, ordering and  review of laboratory studies, ordering and review of radiographic  studies, pulse oximetry and re-evaluation of patient's condition.   Date: 03/05/2013  Rate: 83  Rhythm: normal sinus rhythm and premature atrial contractions (PAC)  QRS Axis: normal  Intervals: normal  ST/T Wave abnormalities: nonspecific ST/T changes  Conduction Disutrbances:none  Narrative Interpretation:   Old EKG Reviewed: unchanged from 02/20/2013     I personally performed the services described in this documentation, which was scribed in my presence. The recorded information has been reviewed and considered.  Devoria Albe, MD, FACEP   Ward Givens, MD 03/05/13 1345  Ward Givens, MD 03/05/13 1347

## 2013-03-05 NOTE — ED Notes (Signed)
Report given to [facility] with [facility]

## 2013-03-05 NOTE — Progress Notes (Addendum)
Ms. Faith Patricelli, 79 year old female history of atrial fibrillation currently on Coumadin (INR 2.1) .  In ER for shortness of breath and hematemesis. Patient found to have 2 large episodes of hematemesis. Hemoglobin currently 14. Vital signs stable. Pacemaker was placed 2 weeks ago. Patient has not moved her shoulder since then possibly frozen shoulder. Questionable pneumonia as well. Patient placed on Protonix drip and Cardizem drip by [facility] physician. Admitted to SDU.

## 2013-03-05 NOTE — ED Notes (Signed)
Pt is being upgraded to stepdown

## 2013-03-05 NOTE — ED Notes (Signed)
Pt had vomited coffee ground emesis, pt cleaned and full linen change, EDP made aware and sample sent to lab, pt denies nausea at this time, reported that pt did same x 1 earlier this morning

## 2013-03-05 NOTE — H&P (Signed)
[facility] History and Physical  [name] AVW:[id number] DOB: Nov 03, 1922 DOA: 03/05/2013  Referring physician: Dr. Lars Mage, ER physician PCP: [name], MD  Specialists: Cardiologist: Dr. Beulah Gandy EP: Dr. Ladona Ridgel  Chief Complaint: vomiting blood  HPI: Sierra Good is a 79 y.o. female with history of atrial fibrillation who recently had a pacemaker placed approximately 2 weeks ago for symptomatic bradycardia. Patient and family reports that she has felt short of breath for quite some time now (weeks to months). Her shortness of breath has not gotten any worse than normal. She's not had any chest pain. She did feel feverish last night and had associated diaphoresis. She's had a nonproductive cough overnight as well. Approximately 2 days ago, patient had episode of emesis which was brown in color. She's had increasing nausea since then. Her by mouth intake has been poor. She did not have any emesis yesterday, but this morning she began having coffee-ground emesis. She's had 3 large volume emesis thus far. The patient was at home and fell over while reaching into her dryer. She may have fallen onto her right side although she is not sure. The patient has had pain in her right shoulder for quite some time now which she attributes to arthritis. After her pacemaker, she was not moving her right shoulder due to pacemaker precautions. After 1 week, she tried to move her shoulder but was unable to do so. This pain in her shoulder appears to have gotten worse after her fall. Imaging in the ER has been unremarkable. During her stay in the ER, she has 2 witnessed episodes of large-volume hematemesis. Her heart rate, which was normal on admission, has changed to atrial fibrillation with a heart rate in the 120s to 130s. Due to lack of GI coverage over the weekend, the patient will be transferred to [facility], for further treatments.  Review of Systems: pertinent positives as per HPI, otherwise  negative  Past Medical History  Diagnosis Date  . GERD (gastroesophageal reflux disease)        . Hyperlipidemia   . Hypertension   . Osteoarthritis of lumbar spine   . PONV (postoperative nausea and vomiting) 2009    after total hip  . Deviated nasal septum     pt had septoplasty  . Nasal turbinate hypertrophy     bilateral  . Cancer     nasal cancer  . Cancer     cancer in the nose and in lower jaw  . Atrial fibrillation   . Dizziness   . Itching   . Diffuse large B cell lymphoma     Dr. Mariel Sleet -Nasal cavity, right sided lymph adenopathy  . OAB (overactive bladder)   . 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 Total hip arthroplasty  2009    Right, [facility] (general)  . Bunionectomy  20+yrs ago    bilateral, [facility]  . Hammer toe surgery      20+ yrs ago  . Cataract extraction w/ intraocular lens  implant, bilateral  2010    [facility]  . Nasal septoplasty w/ turbinoplasty  12/18/2010    Procedure: NASAL SEPTOPLASTY WITH TURBINATE REDUCTION;  Surgeon: Darletta Moll;  Location: [facility];  Service: ENT;  Laterality: Bilateral;  . Colon surgery  10/29/2008    Ingram-right hemicolectomy for tubulovillous adenoma (3cm) with HG glandular dysplasia  . Portacath placement  02/10/2011    Procedure:  INSERTION PORT-A-CATH;  Surgeon: Marlane Hatcher;  Location: [facility];  Service: General;  Laterality: Left;  left subclavian  . Skin cancer removal  6/13  . Colonoscopy  09/28/2002    RMR: Normal colonic mucosa.  Redundant and elongated but otherwise normal appearing colon/normal rectum  . Colonoscopy  09/11/2008    RMR: Normal rectum/Diminutive sigmoid polyp status post snare removal/  Flat sessile semilunar lesion at and in the ileocecal valve status post hot snare piecemeal debulking as described above  . Colonoscopy  01/06/10    Rourk-normal rectum, normal residual colon status post right hemicolectomy. Next colonoscopy in  01/2015 health permiting  . Esophagogastroduodenoscopy (egd) with esophageal dilation  05/23/2012    ZOX:[id number] ring-dilated as described above. Hiatal hernia   Social History:  reports that she has never smoked. She has never used smokeless tobacco. She reports that she does not drink alcohol or use illicit drugs.   Allergies  Allergen Reactions  . Statins Other (See Comments)    Causes leg cramps and muscle weakness    Family History  Problem Relation Age of Onset  . Pseudochol deficiency Neg Hx   . Malignant hyperthermia Neg Hx   . Hypotension Neg Hx   . Anesthesia problems Neg Hx   . Cancer Mother   . Stroke Father     Prior to Admission medications   Medication Sig Start Date End Date Taking? Authorizing Provider  amLODipine (NORVASC) 5 MG tablet Take 1 tablet (5 mg total) by mouth daily. 12/16/12  Yes Laqueta Linden, MD  Calcium-Vitamin D (CALTRATE 600 PLUS-VIT D PO) Take 1 tablet by mouth daily.   Yes Historical Provider, MD  clobetasol cream (TEMOVATE) 0.05 % Apply 1 application topically 2 (two) times daily.  02/01/12  Yes Historical Provider, MD  dexlansoprazole (DEXILANT) 60 MG capsule Take 60 mg by mouth daily.   Yes Historical Provider, MD  Multiple Vitamins-Minerals (CENTRUM SILVER ULTRA WOMENS) TABS Take 1 tablet by mouth daily.     Yes Historical Provider, MD  warfarin (COUMADIN) 4 MG tablet Take 2-4 mg by mouth daily. Patient takes 2mg  on Mon.,Wed.,Fri.,and 4mg  all other days.   Yes Historical Provider, MD  cephALEXin (KEFLEX) 500 MG capsule Take 500 mg by mouth 3 (three) times daily. 02/21/13   Dione Booze, MD   Physical Exam: Filed Vitals:   03/05/13 1420  BP:   Pulse:   Temp:   Resp: 15     General:  Ill appearing, in no distress  Eyes: PERRLA  ENT: mucous membranes are dry  Neck: supple  Cardiovascular: s1, s2, irregular, tachy  Respiratory: cta b  Abdomen: soft, nt, mildly distended, bs+  Skin: no rashes, incision from pacemaker does  not have any erythema, tenderness, discharge or other signs of infection.  Musculoskeletal: no peripheral edema, tenderness to palpation over the right shoulder with marked decreased range of motion, no erythema or edema noted.  Psychiatric: normal affect, cooperative with exam  Neurologic: grossly intact, nonfocal  Labs on Admission:  Basic Metabolic Panel:  Recent Labs Lab 03/05/13 0856  NA 137  K 4.0  CL 96  CO2 26  GLUCOSE 150*  BUN 24*  CREATININE 0.83  CALCIUM 10.8*   Liver Function Tests:  Recent Labs Lab 03/05/13 0930  AST 20  ALT 13  ALKPHOS 75  BILITOT 0.9  PROT 6.6  ALBUMIN 4.0   No results found for this basename: LIPASE, AMYLASE,  in the last 168 hours No results found for  this basename: AMMONIA,  in the last 168 hours CBC:  Recent Labs Lab 03/05/13 0856  WBC 24.5*  NEUTROABS 23.3*  HGB 14.0  HCT 41.7  MCV 95.0  PLT 231   Cardiac Enzymes:  Recent Labs Lab 03/05/13 0856  TROPONINI <0.30    BNP (last 3 results)  Recent Labs  03/05/13 0926  PROBNP 163.7   CBG: No results found for this basename: GLUCAP,  in the last 168 hours  Radiological Exams on Admission: Dg Chest 1 View  03/05/2013   CLINICAL DATA:  Larey Seat 2 days ago, anterior right shoulder and right humeral pain, initial encounter  EXAM: CHEST - 1 VIEW  COMPARISON:  02/20/2013  FINDINGS: Right subclavian sequential transvenous pacemaker leads project at right atrium and right ventricle.  Enlargement of cardiac silhouette.  Atherosclerotic calcification aorta.  Mediastinal contours and pulmonary vascularity normal.  Emphysematous and bronchitic changes compatible with COPD.  Question mild patchy infiltrate in lower left lung, predominately lower lobe.  Remaining lungs clear.  No pleural effusion or pneumothorax.  Bones demineralized.  IMPRESSION: Enlargement of cardiac silhouette.  Question COPD changes with mild patchy left basilar infiltrate predominately left lower lobe.    Electronically Signed   By: Ulyses Southward M.D.   On: 03/05/2013 10:04   Dg Shoulder Right  03/05/2013   CLINICAL DATA:  Right shoulder in humeral pain post fall 2 days ago, initial encounter  EXAM: RIGHT SHOULDER - 2+ VIEW  COMPARISON:  None  FINDINGS: Osseous demineralization.  AC joint alignment normal with cranial spur formation.  Narrowed acromiohumeral interval, which may predispose the patient to rotator cuff pathology.  No acute fracture, dislocation, or bone destruction.  Visualized right ribs intact.  Pacemaker generator right chest wall.  IMPRESSION: Osseous demineralization with mild AC joint degenerative changes.  No acute abnormalities.   Electronically Signed   By: Ulyses Southward M.D.   On: 03/05/2013 10:06   Dg Humerus Right  03/05/2013   CLINICAL DATA:  Pain right humerus and shoulder post fall 2 days ago, initial encounter  EXAM: RIGHT HUMERUS - 2+ VIEW  COMPARISON:  None  FINDINGS: Osseous demineralization.  Shoulder and elbow joint alignments grossly normal.  No acute fracture, dislocation, or bone destruction.  IMPRESSION: No acute osseous abnormalities.   Electronically Signed   By: Ulyses Southward M.D.   On: 03/05/2013 10:07    EKG: Independently reviewed. Sinus rhythm, repeat EKG pending  Assessment/Plan Principal Problem:   GI bleed Active Problems:   HYPERTENSION   Long term (current) use of anticoagulants   Pacemaker   Right shoulder pain   Atrial fibrillation with RVR   Community acquired pneumonia   Acute respiratory failure   Dehydration   Leukocytosis   1. GI bleed, complicated by anticoagulation on Coumadin. Likely upper GI bleed. Patient has had multiple episodes of large-volume hematemesis. Her hemoglobin will need to be monitored closely, every 6 hours. She'll be typed and screened. She has received vitamin K in case and try in the emergency room, ordered by EDP. She's been started on a Protonix infusion. Due to lack of GI coverage over the weekend, she will be  transferred to [facility] for further evaluation. GI will need to be consulted on patient's arrival. We'll keep her n.p.o. for now and place NG tube. 2. Acute respiratory failure. Possibly due to aspiration due to ongoing emesis. She's been started on antibiotics. Wean down oxygen as tolerated. 3. Pneumonia. Community-acquired versus aspiration. Continue current antibiotics. 4.  Atrial fibrillation with a ventricular response. Initially the patient presented with sinus rhythm. She has since converted to rapid atrial fibrillation. Blood pressure appears to be stable at this time. She'll be started on a Cardizem infusion to help her control her heart rate. Hold Coumadin with ongoing bleeding. 5. Dehydration. Likely related to decreased by mouth intake with ongoing nausea vomiting. We will start on IV fluids. 6. Leukocytosis. Likely related to pneumonia versus stress response to GI bleed. We'll continue to monitor. Check blood cultures. 7. Right shoulder pain. Pacemaker does not appear to be infected at this time. Could be related to frozen shoulder from decreased mobility post pacemaker. May need orthopedic evaluation once acute issues have resolved.  I discussed the case with Dr. Catha Gosselin who has accepted the patient in transfer.  Code Status: DNR, confirmed with daughter Family Communication: discussed with patient and daughter Disposition Plan: to be determined, admit to [facility] step down  Time spent: critical care:  Tyvion Edmondson [facility] Pager [phone]  If 7PM-7AM, please contact night-coverage [email or web address] Password [facility] 03/05/2013, 2:23 PM

## 2013-03-05 NOTE — ED Notes (Signed)
With another episode of coffee ground emesis

## 2013-03-05 NOTE — ED Notes (Addendum)
Pt c/o sob that became worse a few days ago, pain to right upper arm area from a fall that happened on Friday evening due to weakness, pt and family report that pt has been weak since having a pacemaker placed recently. Pt c/o pain to right upper arm area, cms intact distal, admits to nausea, vomiting with dark emesis that started this am, fever during the night.

## 2013-03-05 NOTE — Progress Notes (Signed)
[facility] PROGRESS NOTE  [name] NWG:[id number] DOB: January 15, 1923 DOA: 03/05/2013 PCP: [name], MD  Assessment/Plan: Principal Problem:   GI bleed Active Problems:   HYPERTENSION   Long term (current) use of anticoagulants   Pacemaker   Right shoulder pain   Atrial fibrillation with RVR   Community acquired pneumonia   Acute respiratory failure   Dehydration   Leukocytosis    1. GI bleed: Patient presented with recurrent episodes of hematemesis, since this AM, in the setting of Coumadin-induced coagulopathy. HB was 14.0 on initial evaluation, and she was hemodynamically stable. INR was 2.15, but improved to 1.23, after IV Vit K, administered in [facility] ED. Patient is typed and screened, and has been started on ivi Protonix infusion. NPO. Dr Charna Elizabeth has been consulted for GI input. Anticoagulation has been discontinued.  2. Pneumonia/Acute respiratory failure:  Oxygen saturation was 89% on initial evaluation, and CXR revealed COPD changes with mild patchy left basilar infiltrate predominately left lower lobe. This likely due to aspiration, from hematemesis, but patient did have a nonproductive cough and felt feverish overnight, so CAP is not excluded. Started on iv Levaquin. I concur with this antibiotic choice.  3. Paroxysmal Atrial fibrillation: Patient has known atrial fibrillation, and tachy-brady syndrome, s/p PPM on 02/16/13. HR was initially reasonable, and in SR, but in the ED, she became tachycardic, was found to have atrial fibrillation with ventricular response rate of 137. Likely, this was precipitated by her current acute problems. She has responded to ivi Cardizem infusion, and HR is currently controlled at 89/min. Patient has no chest pain or SOB, and no clinical evidence of CHF decompensation at this time. Will cycle cardiac enzymes. 4. Query Dehydration: Patient has elevated BUN/Creatinine ratio. This is likely due to GI blood loss, in addition to mild  dehydration, from vomiting.  Likely related to decreased by mouth intake with ongoing nausea vomiting. Gentle ivi fluids. 5. Leukocytosis: Likely related to pneumonia versus stress response to GI bleed. Following CBC. Blood cultures have been sent off. 6. Right shoulder pain: Pacemaker does not appear to be infected at this time, and X-ray is devoid of acute findings. Could be related to frozen shoulder from decreased mobility, post pacemaker. May need orthopedic evaluation once acute issues have resolved. Meanwhile, for prn analgesics.  7. History of B-Cell Lymphoma: Previously under care of [name]. Completed chemo-therapy about 2 years ago. Appears to be in remission.   Code Status: DNR Family Communication:  Disposition Plan: To be determined.    Brief narrative: 79 y.o. female with history of Dyslipidemia, OA/DJD s/p right THA, S/P right hemicolectomy 2012, for tubulovillous adenoma, Diffuse large B-Cell lymphoma, HTN, atrial fibrillation who recently had a pacemaker placed approximately 2 weeks ago for symptomatic bradycardia. Patient and family reports that she has felt short of breath for quite some time now (weeks to months). Her shortness of breath has not gotten any worse than normal. She's not had any chest pain. She did feel feverish last night and had associated diaphoresis. She's had a nonproductive cough overnight as well. Approximately 2 days ago, patient had episode of emesis which was brown in color. She's had increasing nausea since then. Her by mouth intake has been poor. She did not have any emesis yesterday, but this morning she began having coffee-ground emesis. She's had 3 large volume emesis thus far. The patient was at home and fell over while reaching into her dryer. She may have fallen onto her right side although  she is not sure. The patient has had pain in her right shoulder for quite some time now which she attributes to arthritis. After her pacemaker, she was not  moving her right shoulder due to pacemaker precautions. After 1 week, she tried to move her shoulder but was unable to do so. This pain in her shoulder appears to have gotten worse after her fall. Imaging in the ER has been unremarkable. During her stay in the ER, she has 2 witnessed episodes of large-volume hematemesis. Her heart rate, which was normal on admission, has changed to atrial fibrillation with a heart rate in the 120s to 130s. Due to lack of GI coverage over the weekend, the patient was transferred to [facility], for further evaluation and management.   Consultants:  GI  Procedures:  CXR.  X-Ray right shoulder.   Antibiotics:  Levaquin.   HPI/Subjective: No chest pain, abdominal pain or SOB.   Objective: Vital signs in last 24 hours: Temp:  [97.7 F (36.5 C)-97.8 F (36.6 C)] 97.7 F (36.5 C) (09/28 1800) Pulse Rate:  [37-137] 101 (09/28 1620) Resp:  [14-21] 18 (09/28 1620) BP: (100-149)/(59-105) 122/67 mmHg (09/28 1620) SpO2:  [74 %-98 %] 91 % (09/28 1748) Weight:  [63.504 kg (140 lb)] 63.504 kg (140 lb) (09/28 0853) Weight change:     Intake/Output from previous day:       Physical Exam: General: Comfortable, alert, communicative, fully oriented, not short of breath at rest.  HEENT:  Mild clinical pallor, no jaundice, no conjunctival injection or discharge. NECK:  Supple, JVP not seen, no carotid bruits, no palpable lymphadenopathy, no palpable goiter. CHEST:  Left basal crackles, no wheeze. HEART:  Sounds 1 and 2 heard, normal, irregular, no murmurs. ABDOMEN:  Full, soft, non-tender, no palpable organomegaly, no palpable masses, normal bowel sounds. GENITALIA:  Not exmined. LOWER EXTREMITIES:  No pitting edema, palpable peripheral pulses. MUSCULOSKELETAL SYSTEM:  Generalized osteoarthritic changes, otherwise, normal. CENTRAL NERVOUS SYSTEM:  No focal neurologic deficit on gross examination.  Lab Results:  Recent Labs  03/05/13 0856  WBC 24.5*  HGB  14.0  HCT 41.7  PLT 231    Recent Labs  03/05/13 0856  NA 137  K 4.0  CL 96  CO2 26  GLUCOSE 150*  BUN 24*  CREATININE 0.83  CALCIUM 10.8*   Recent Results (from the past 240 hour(s))  CULTURE, BLOOD (ROUTINE X 2)     Status: None   Collection Time    03/05/13  3:44 PM      Result Value Range Status   Specimen Description     Final   Value: BLOOD BLOOD RIGHT HAND BOTTLES DRAWN AEROBIC AND ANAEROBIC   Special Requests 10CC   Final   Culture PENDING   Incomplete   Report Status PENDING   Incomplete  CULTURE, BLOOD (ROUTINE X 2)     Status: None   Collection Time    03/05/13  3:44 PM      Result Value Range Status   Specimen Description     Final   Value: BLOOD BLOOD LEFT HAND BOTTLES DRAWN AEROBIC AND ANAEROBIC   Special Requests 10CC   Final   Culture PENDING   Incomplete   Report Status PENDING   Incomplete     Studies/Results: Dg Chest 1 View  03/05/2013   CLINICAL DATA:  Larey Seat 2 days ago, anterior right shoulder and right humeral pain, initial encounter  EXAM: CHEST - 1 VIEW  COMPARISON:  02/20/2013  FINDINGS: Right  subclavian sequential transvenous pacemaker leads project at right atrium and right ventricle.  Enlargement of cardiac silhouette.  Atherosclerotic calcification aorta.  Mediastinal contours and pulmonary vascularity normal.  Emphysematous and bronchitic changes compatible with COPD.  Question mild patchy infiltrate in lower left lung, predominately lower lobe.  Remaining lungs clear.  No pleural effusion or pneumothorax.  Bones demineralized.  IMPRESSION: Enlargement of cardiac silhouette.  Question COPD changes with mild patchy left basilar infiltrate predominately left lower lobe.   Electronically Signed   By: Ulyses Southward M.D.   On: 03/05/2013 10:04   Dg Shoulder Right  03/05/2013   CLINICAL DATA:  Right shoulder in humeral pain post fall 2 days ago, initial encounter  EXAM: RIGHT SHOULDER - 2+ VIEW  COMPARISON:  None  FINDINGS: Osseous demineralization.  AC  joint alignment normal with cranial spur formation.  Narrowed acromiohumeral interval, which may predispose the patient to rotator cuff pathology.  No acute fracture, dislocation, or bone destruction.  Visualized right ribs intact.  Pacemaker generator right chest wall.  IMPRESSION: Osseous demineralization with mild AC joint degenerative changes.  No acute abnormalities.   Electronically Signed   By: Ulyses Southward M.D.   On: 03/05/2013 10:06   Dg Humerus Right  03/05/2013   CLINICAL DATA:  Pain right humerus and shoulder post fall 2 days ago, initial encounter  EXAM: RIGHT HUMERUS - 2+ VIEW  COMPARISON:  None  FINDINGS: Osseous demineralization.  Shoulder and elbow joint alignments grossly normal.  No acute fracture, dislocation, or bone destruction.  IMPRESSION: No acute osseous abnormalities.   Electronically Signed   By: Ulyses Southward M.D.   On: 03/05/2013 10:07    Medications: Scheduled Meds: . azithromycin  500 mg Intravenous Q24H  . cefTRIAXone (ROCEPHIN)  IV  1 g Intravenous Q24H  . levalbuterol  0.63 mg Nebulization Q6H  . pantoprazole      . pantoprazole       Continuous Infusions: . sodium chloride 125 mL/hr (03/05/13 1818)  . diltiazem (CARDIZEM) infusion 10 mg/hr (03/05/13 1531)  . pantoprozole (PROTONIX) infusion 8 mg/hr (03/05/13 1558)   PRN Meds:.acetaminophen, acetaminophen, levalbuterol, ondansetron (ZOFRAN) IV, ondansetron    LOS: 0 days   Krystyne Tewksbury,CHRISTOPHER  [facility] Pager [phone]. If 8PM-8AM, please contact night-coverage at [email or web address], password [facility] 03/05/2013, 6:35 PM  LOS: 0 days

## 2013-03-05 NOTE — ED Notes (Signed)
cardizem bolus of 10 mg given via bag via IV pump

## 2013-03-06 ENCOUNTER — Encounter (HOSPITAL_COMMUNITY): Payer: Self-pay | Admitting: *Deleted

## 2013-03-06 ENCOUNTER — Encounter (HOSPITAL_COMMUNITY)
Admission: EM | Disposition: A | Discharge status: Skilled Nursing Facility | Payer: Self-pay | Source: Home / Self Care | Attending: Internal Medicine

## 2013-03-06 DIAGNOSIS — K920 Hematemesis: Secondary | ICD-10-CM

## 2013-03-06 HISTORY — PX: ESOPHAGOGASTRODUODENOSCOPY: SHX5428

## 2013-03-06 LAB — CBC
Hemoglobin: 13.2 g/dL (ref 12.0–15.0)
Hemoglobin: 12.2 g/dL (ref 12.0–15.0)
MCH: 32.2 pg (ref 26.0–34.0)
MCH: 32.3 pg (ref 26.0–34.0)
MCHC: 33.6 g/dL (ref 30.0–36.0)
MCHC: 33.9 g/dL (ref 30.0–36.0)
MCHC: 35.9 g/dL (ref 30.0–36.0)
MCV: 93.4 fL (ref 78.0–100.0)
MCV: 95.2 fL (ref 78.0–100.0)
MCV: 95.9 fL (ref 78.0–100.0)
Platelets: 190 10*3/uL (ref 150–400)
Platelets: 194 10*3/uL (ref 150–400)
RBC: 3.79 MIL/uL — ABNORMAL LOW (ref 3.87–5.11)
RBC: 4.1 MIL/uL (ref 3.87–5.11)
RDW: 14.4 % (ref 11.5–15.5)
RDW: 14.9 % (ref 11.5–15.5)
RDW: 14.9 % (ref 11.5–15.5)
WBC: 30.8 10*3/uL — ABNORMAL HIGH (ref 4.0–10.5)

## 2013-03-06 LAB — BASIC METABOLIC PANEL
CO2: 25 mEq/L (ref 19–32)
Calcium: 9.8 mg/dL (ref 8.4–10.5)
Chloride: 97 mEq/L (ref 96–112)
Creatinine, Ser: 0.85 mg/dL (ref 0.50–1.10)
Glucose, Bld: 105 mg/dL — ABNORMAL HIGH (ref 70–99)

## 2013-03-06 LAB — PROTIME-INR
INR: 1.12 (ref 0.00–1.49)
Prothrombin Time: 14.2 seconds (ref 11.6–15.2)

## 2013-03-06 LAB — TSH: TSH: 0.759 u[IU]/mL (ref 0.350–4.500)

## 2013-03-06 LAB — TROPONIN I: Troponin I: <0.3 ng/mL (ref <0.30)

## 2013-03-06 SURGERY — EGD (ESOPHAGOGASTRODUODENOSCOPY)
Anesthesia: Moderate Sedation | Laterality: Left

## 2013-03-06 MED ORDER — INFLUENZA VAC SPLIT QUAD 0.5 ML IM SUSP
0.5000 mL | INTRAMUSCULAR | Status: AC
  Administered 2013-03-07: 0.5 mL via INTRAMUSCULAR
  Filled 2013-03-06: qty 0.5

## 2013-03-06 MED ORDER — FENTANYL CITRATE 0.05 MG/ML IJ SOLN
INTRAMUSCULAR | Status: AC
  Filled 2013-03-06: qty 2

## 2013-03-06 MED ORDER — MIDAZOLAM HCL 10 MG/2ML IJ SOLN
INTRAMUSCULAR | Status: DC | PRN
  Administered 2013-03-06 (×2): 1 mg via INTRAVENOUS
  Administered 2013-03-06: 2 mg via INTRAVENOUS

## 2013-03-06 MED ORDER — SODIUM CHLORIDE 0.9 % IV SOLN: INTRAVENOUS | Status: DC

## 2013-03-06 MED ORDER — FENTANYL CITRATE 0.05 MG/ML IJ SOLN
INTRAMUSCULAR | Status: DC | PRN
  Administered 2013-03-06 (×2): 25 ug via INTRAVENOUS

## 2013-03-06 MED ORDER — MIDAZOLAM HCL 5 MG/ML IJ SOLN
INTRAMUSCULAR | Status: AC
  Filled 2013-03-06: qty 2

## 2013-03-06 MED ORDER — BUTAMBEN-TETRACAINE-BENZOCAINE 2-2-14 % EX AERO
INHALATION_SPRAY | CUTANEOUS | Status: DC | PRN
  Administered 2013-03-06: 2 via TOPICAL

## 2013-03-06 MED ORDER — PANTOPRAZOLE SODIUM 40 MG PO TBEC
40.0000 mg | DELAYED_RELEASE_TABLET | Freq: Every day | ORAL | Status: DC
  Administered 2013-03-07 – 2013-03-10 (×4): 40 mg via ORAL
  Filled 2013-03-06 (×4): qty 1

## 2013-03-06 NOTE — Progress Notes (Signed)
Utilization Review Completed.  

## 2013-03-06 NOTE — Progress Notes (Signed)
[facility] Daily Rounding Note 03/06/2013, 8:11 AM  SUBJECTIVE:       Not hungry.  Her appetite is chronically poor.  No nausea, no stools this AM.   OBJECTIVE:         Vital signs in last 24 hours:    Temp:  [97.7 F (36.5 C)-98 F (36.7 C)] 98 F (36.7 C) (09/29 0437) Pulse Rate:  [37-137] 80 (09/29 0437) Resp:  [14-25] 19 (09/29 0437) BP: (100-149)/(41-105) 120/50 mmHg (09/29 0437) SpO2:  [74 %-98 %] 95 % (09/29 0437) Weight:  [63.504 kg (140 lb)-64.6 kg (142 lb 6.7 oz)] 64.6 kg (142 lb 6.7 oz) (09/29 0500)   General: frail, elderly WF.  Overall looks well and comfortable    Heart: Irreg, rate controlled.  Pacer in the upper right chest Chest: clear.  No labored breathing.  Voice is hoarse and weak.  Abdomen: soft, active BS.  NT, ND.  No mass or HSM  Extremities: no CCE Neuro/Psych:  Pleasant, not confused.  Moves all 4s.  Intake/Output from previous day:    Intake/Output this shift:    Lab Results:  Recent Labs  2013-03-30 0856 March 30, 2013 2100 03/06/13 0050  WBC 24.5* 34.7* 30.8*  HGB 14.0 13.0 12.7  HCT 41.7 37.0 35.4*  PLT 231 215 190   BMET  Recent Labs  2013/03/30 0856  NA 137  K 4.0  CL 96  CO2 26  GLUCOSE 150*  BUN 24*  CREATININE 0.83  CALCIUM 10.8*   LFT  Recent Labs  2013-03-30 0930  PROT 6.6  ALBUMIN 4.0  AST 20  ALT 13  ALKPHOS 75  BILITOT 0.9  BILIDIR 0.2  IBILI 0.7   PT/INR  Recent Labs  30-Mar-2013 1510 2013/03/30 2100  LABPROT 15.2 14.1  INR 1.23 1.11    Studies/Results: Dg Chest 1 View March 30, 2013  FINDINGS: Right subclavian sequential transvenous pacemaker leads project at right atrium and right ventricle.  Enlargement of cardiac silhouette.  Atherosclerotic calcification aorta.  Mediastinal contours and pulmonary vascularity normal.  Emphysematous and bronchitic changes compatible with COPD.  Question mild patchy infiltrate in lower left lung, predominately lower lobe.  Remaining lungs clear.  No pleural effusion or  pneumothorax.  Bones demineralized.  IMPRESSION: Enlargement of cardiac silhouette.  Question COPD changes with mild patchy left basilar infiltrate predominately left lower lobe.   Electronically Signed   By: Ulyses Southward M.D.   On: 2013-03-30 10:04   Dg Shoulder Right 2013/03/30   CLINICAL DATA:  Right shoulder in humeral pain post fall 2 days ago, initial encounter  EXAM: RIGHT SHOULDER - 2+ VIEW  COMPARISON:  None  FINDINGS: Osseous demineralization.  AC joint alignment normal with cranial spur formation.  Narrowed acromiohumeral interval, which may predispose the patient to rotator cuff pathology.  No acute fracture, dislocation, or bone destruction.  Visualized right ribs intact.  Pacemaker generator right chest wall.  IMPRESSION: Osseous demineralization with mild AC joint degenerative changes.  No acute abnormalities.   Electronically Signed   By: Ulyses Southward M.D.   On: 2013/03/30 10:06   Dg Humerus Right 2013-03-30   CLINICAL DATA:  Pain right humerus and shoulder post fall 2 days ago, initial encounter  EXAM: RIGHT HUMERUS - 2+ VIEW  COMPARISON:  None  FINDINGS: Osseous demineralization.  Shoulder and elbow joint alignments grossly normal.  No acute fracture, dislocation, or bone destruction.  IMPRESSION: No acute osseous abnormalities.   Electronically Signed   By: Ulyses Southward  M.D.   On: 03/05/2013 10:07    ASSESMENT: *  SG emesis and Melenic stool No longer nauseated.  No NSAIDs,  *  Normocytic anemia.  Baseline is about 14.0 *  Bradycardia. Atrial fib. S/p dual chamber pacemaker 9/10 *  Chronic Coumadin.  INR  Subtherapeutic.   *  Hx "refractory" GERD per Dr Luvenia Starch 05/2012 note.   EGD with dilatation on 05/23/12. schatski ring and HH noted.  *  Chronic dyspnea.  *  Shoulder pain, post pacemaker and more recent fall at home.  No fracture on x rays.  *  Hx TVA with HGD colon polyp 10/2008, required right hemicolectomy. Follow up colonoscopy 01/2010, no  Recurrent or residual polyps .  Next  due 8/16, health permitting.     PLAN: *  EGD today.  *  Pt is on PPI drip, this is "overkill" but will leave in place for now.  Correct dosing of PPI to be decided after EGD.    LOS: 1 day   Jennye Moccasin  03/06/2013, 8:11 AM Pager: [phone]

## 2013-03-06 NOTE — Progress Notes (Addendum)
[facility] Progress Note [place] TEAM 1 - Stepdown/ICU TEAM   Chauncy Lean ZOX:[id number] DOB: 1922-12-03 DOA: 03/05/2013 PCP: [name], MD  Admit HPI / Brief Narrative: 79 y.o. female with history of atrial fibrillation who had a pacemaker placed approximately 2 weeks ago for symptomatic bradycardia. Patient and family reported that she had felt short of breath for weeks to months. Her shortness of breath had not gotten any worse than normal. She'd not had any chest pain. She did feel feverish and had associated diaphoresis. Patient had 3 large volume coffee ground emesis episodes the day of her admit.    During her stay in the ER, she has 2 witnessed episodes of large-volume hematemesis. Her heart rate, which was normal on admission, changed to atrial fibrillation with a heart rate in the 120s to 130s. Due to lack of GI coverage over the weekend, the patient was transferred to [facility] for further treatments.  Assessment/Plan:  Coffee ground emesis / melena GI following - EGD unrevelaing - cont PPI - follow serial CBC - of note INR was not supratherapeutic, and rapidly normalized over first 24hrs of care  ?LLL CAP with acute hypoxic respiratory failure Cont empiric abx tx - follow clinically - perhaps pt suffered a limited episode of aspiration as clinically she does not appear to have PNA sx  Chronic "refractory" GERD Cont PPI - GI following   Parox Afib w/ RVR On chronic coumadin tx (on hold for now) - rate controlled - follow on tele   [facility] Improving - cont gentle hydration until intake improved   R shoulder pain / frozen shoulder Has baseline arthritis - held shoulder in splint with minimal/no movement due to PPM - Xrays w/o fx - begin PT/OT to improve ROM  History of B-Cell Lymphoma Previously under care of [name] - completed chemo-therapy about 2 years ago - appears to be in remission  Code Status: NO CODE Family Communication: spoke w/ 2 daughters at  bedside  Disposition Plan: SDU  Consultants: GI  Procedures: EGD - 9/29 - no clear source of blood loss - nodule was found in the gastric antrum; multiple biopsies  Antibiotics: azithro 9/28 >> Rocephin 9/28 >>  DVT prophylaxis: SCDs  HPI/Subjective: Pt is resting comfortably.  No further emesis.  Denies abdom pain, cp, or sob.   Objective: Blood pressure 116/57, pulse 75, temperature 98.7 F (37.1 C), temperature source Oral, resp. rate 17, height 5\' 6"  (1.676 m), weight 64.6 kg (142 lb 6.7 oz), SpO2 96.00%.  Intake/Output Summary (Last 24 hours) at 03/06/13 1443 Last data filed at 03/06/13 0800  Gross per 24 hour  Intake      0 ml  Output    100 ml  Net   -100 ml   Exam: General: No acute respiratory distress Lungs: Clear to auscultation bilaterally without wheezes or crackles Cardiovascular: Regular rate without murmur gallop or rub normal S1 and S2 Abdomen: Nontender, nondistended, soft, bowel sounds positive, no rebound, no ascites, no appreciable mass Extremities: No significant cyanosis, clubbing, or edema bilateral lower extremities  Data Reviewed: Basic Metabolic Panel:  Recent Labs Lab 03/05/13 0856  NA 137  K 4.0  CL 96  CO2 26  GLUCOSE 150*  BUN 24*  CREATININE 0.83  CALCIUM 10.8*   Liver Function Tests:  Recent Labs Lab 03/05/13 0930  AST 20  ALT 13  ALKPHOS 75  BILITOT 0.9  PROT 6.6  ALBUMIN 4.0   No results found for this basename:  LIPASE, AMYLASE,  in the last 168 hours No results found for this basename: AMMONIA,  in the last 168 hours CBC:  Recent Labs Lab 03/05/13 0856 03/05/13 2100 03/06/13 0050  WBC 24.5* 34.7* 30.8*  NEUTROABS 23.3*  --   --   HGB 14.0 13.0 12.7  HCT 41.7 37.0 35.4*  MCV 95.0 94.4 93.4  PLT 231 215 190   Cardiac Enzymes:  Recent Labs Lab 03/05/13 0856 03/05/13 1510 03/06/13 0238  TROPONINI <0.30 <0.30 <0.30   BNP (last 3 results)  Recent Labs  03/05/13 0926  PROBNP 163.7     Recent  Results (from the past 240 hour(s))  CULTURE, BLOOD (ROUTINE X 2)     Status: None   Collection Time    03/05/13  3:44 PM      Result Value Range Status   Specimen Description     Final   Value: BLOOD BLOOD RIGHT HAND BOTTLES DRAWN AEROBIC AND ANAEROBIC   Special Requests 10CC   Final   Culture NO GROWTH 1 DAY   Final   Report Status PENDING   Incomplete  CULTURE, BLOOD (ROUTINE X 2)     Status: None   Collection Time    03/05/13  3:44 PM      Result Value Range Status   Specimen Description     Final   Value: BLOOD BLOOD LEFT HAND BOTTLES DRAWN AEROBIC AND ANAEROBIC   Special Requests 10CC   Final   Culture NO GROWTH 1 DAY   Final   Report Status PENDING   Incomplete  MRSA PCR SCREENING     Status: None   Collection Time    03/05/13  5:56 PM      Result Value Range Status   MRSA by PCR NEGATIVE  NEGATIVE Final   Comment:            The GeneXpert MRSA Assay (FDA     approved for NASAL specimens     only), is one component of a     comprehensive MRSA colonization     surveillance program. It is not     intended to diagnose MRSA     infection nor to guide or     monitor treatment for     MRSA infections.     Studies:  Recent x-ray studies have been reviewed in detail by the Attending Physician  Scheduled Meds:  Scheduled Meds: . azithromycin  500 mg Intravenous Q24H  . cefTRIAXone (ROCEPHIN)  IV  1 g Intravenous Q24H  . [START ON 03/07/2013] influenza vac split quadrivalent PF  0.5 mL Intramuscular Tomorrow-1000  . levalbuterol  0.63 mg Nebulization Q6H  . [START ON 03/07/2013] pantoprazole  40 mg Oral Q0600    Time spent on care of this patient: 35 mins   [facility] T  [facility] Office  [phone] Pager - Text Page per Loretha Stapler as per below:  On-Call/Text Page:      [email or web address]      password [email or web address]  If 7PM-7AM, please contact night-coverage [email or web address] Password [email or web address] 03/06/2013, 2:43 PM   LOS: 1 day

## 2013-03-06 NOTE — Progress Notes (Signed)
Patient seen, examined, and I agree with the above documentation, including the assessment and plan. HIGHER THAN BASELINE RISK.The nature of the procedure, as well as the risks, benefits, and alternatives were carefully and thoroughly reviewed with the patient. Ample time for discussion and questions allowed. The patient understood, was satisfied, and agreed to proceed.  Warfarin therapy, but INR 1.11 Plts normal EGD today

## 2013-03-06 NOTE — Op Note (Addendum)
[facility] [address] [place], [address]   ENDOSCOPY PROCEDURE REPORT  PATIENT: Sierra Good, Sierra Good  MR#: [id number] BIRTHDATE: 1922/07/10 , 89  yrs. old GENDER: Female ENDOSCOPIST: Dr.  Erick Blinks, MD REFERRED BY:  [facility] PROCEDURE DATE:  03/06/2013 PROCEDURE:  EGD w/ biopsy ASA CLASS:     Class III INDICATIONS:  Hematemesis. MEDICATIONS: These medications were titrated to patient response per physician's verbal order, Fentanyl 50 mcg IV, and Versed 4 mg IV TOPICAL ANESTHETIC: Cetacaine Spray  DESCRIPTION OF PROCEDURE: After the risks benefits and alternatives of the procedure were thoroughly explained, informed consent was obtained.  The Pentax Gastroscope [id number] endoscope was introduced through the mouth and advanced to the second portion of the duodenum. Without limitations.  The instrument was slowly withdrawn as the mucosa was fully examined.     ESOPHAGUS: A non-obstructing Schatzki ring was found at the gastroesophageal junction and was widely open.   A 3 cm hiatal hernia was noted without Cameron's lesion(s).  STOMACH: Small nodule vs. hypertrophied fold was found in the gastric antrum.  Multiple biopsies were performed using cold forceps.  Sample sent for histology.   The stomach otherwise appeared normal.   No evidence of active or recent bleeding.  DUODENUM: The duodenal mucosa showed no abnormalities in the bulb and second portion of the duodenum.  No evidence of active or recent bleeding.  Retroflexed views revealed a hiatal hernia.     The scope was then withdrawn from the patient and the procedure completed.  COMPLICATIONS: There were no complications. ENDOSCOPIC IMPRESSION:  1.   Schatzki ring was found at the gastroesophageal junction 2.   3 cm hiatal hernia 3.   Nodule was found in the gastric antrum; multiple biopsies 4.   The stomach otherwise appeared normal 5.   The duodenal mucosa showed no  abnormalities in the bulb and second portion of the duodenum   RECOMMENDATIONS: 1.  Await pathology results 2.  Continue taking your PPI (antiacid medicine) once daily.  It is best to be taken 20-30 minutes prior to breakfast meal. 3.  Office follow-up with Dr.  Jena Gauss (patient's primary GI doctor) after discharge  eSigned:  Beverley Fiedler, MD 03/06/2013 12:09 PM Revised: 03/06/2013 12:09 PM  CC:The Patient and R.  Roetta Sessions, MD  PATIENT NAME:  Sierra Good, Sierra Good MR#: [id number]

## 2013-03-07 ENCOUNTER — Encounter (HOSPITAL_COMMUNITY): Payer: Self-pay | Admitting: Internal Medicine

## 2013-03-07 DIAGNOSIS — E860 Dehydration: Secondary | ICD-10-CM

## 2013-03-07 DIAGNOSIS — K219 Gastro-esophageal reflux disease without esophagitis: Secondary | ICD-10-CM

## 2013-03-07 LAB — COMPREHENSIVE METABOLIC PANEL
AST: 13 U/L (ref 0–37)
Albumin: 2.6 g/dL — ABNORMAL LOW (ref 3.5–5.2)
Alkaline Phosphatase: 79 U/L (ref 39–117)
Calcium: 9.1 mg/dL (ref 8.4–10.5)
Chloride: 102 mEq/L (ref 96–112)
Creatinine, Ser: 0.81 mg/dL (ref 0.50–1.10)
Glucose, Bld: 106 mg/dL — ABNORMAL HIGH (ref 70–99)
Total Bilirubin: 0.4 mg/dL (ref 0.3–1.2)
Total Protein: 5.8 g/dL — ABNORMAL LOW (ref 6.0–8.3)

## 2013-03-07 LAB — PROTIME-INR: INR: 1.12 (ref 0.00–1.49)

## 2013-03-07 LAB — CBC
MCHC: 34.4 g/dL (ref 30.0–36.0)
MCV: 94.6 fL (ref 78.0–100.0)
Platelets: 184 10*3/uL (ref 150–400)
RBC: 3.5 MIL/uL — ABNORMAL LOW (ref 3.87–5.11)
RDW: 14.9 % (ref 11.5–15.5)
WBC: 16.7 10*3/uL — ABNORMAL HIGH (ref 4.0–10.5)

## 2013-03-07 MED ORDER — LEVALBUTEROL HCL 0.63 MG/3ML IN NEBU
0.6300 mg | INHALATION_SOLUTION | Freq: Three times a day (TID) | RESPIRATORY_TRACT | Status: DC
  Administered 2013-03-07: 0.63 mg via RESPIRATORY_TRACT
  Filled 2013-03-07 (×4): qty 3

## 2013-03-07 MED ORDER — VANCOMYCIN HCL IN DEXTROSE 1-5 GM/200ML-% IV SOLN
1000.0000 mg | Freq: Once | INTRAVENOUS | Status: AC
  Administered 2013-03-07: 1000 mg via INTRAVENOUS
  Filled 2013-03-07: qty 200

## 2013-03-07 MED ORDER — LEVALBUTEROL HCL 0.63 MG/3ML IN NEBU: 0.6300 mg | INHALATION_SOLUTION | Freq: Four times a day (QID) | RESPIRATORY_TRACT | Status: DC | PRN

## 2013-03-07 MED ORDER — VANCOMYCIN HCL 500 MG IV SOLR
500.0000 mg | Freq: Two times a day (BID) | INTRAVENOUS | Status: DC
  Administered 2013-03-08 – 2013-03-10 (×5): 500 mg via INTRAVENOUS
  Filled 2013-03-07 (×7): qty 500

## 2013-03-07 MED ORDER — PIPERACILLIN-TAZOBACTAM 3.375 G IVPB
3.3750 g | Freq: Three times a day (TID) | INTRAVENOUS | Status: DC
  Administered 2013-03-07 – 2013-03-10 (×9): 3.375 g via INTRAVENOUS
  Filled 2013-03-07 (×11): qty 50

## 2013-03-07 MED ORDER — DILTIAZEM HCL ER COATED BEADS 120 MG PO CP24
120.0000 mg | ORAL_CAPSULE | Freq: Every day | ORAL | Status: DC
  Administered 2013-03-07 – 2013-03-10 (×4): 120 mg via ORAL
  Filled 2013-03-07 (×4): qty 1

## 2013-03-07 NOTE — Clinical Social Work Note (Signed)
CSW provided patient's daughter, Okey Regal, with a SNF list. Okey Regal explained that [facility] is their first choice for a SNF, and reiterated that the family would not like to go to [place]. CSW explained again that bed offers depend on insurance coverage and availability, and Okey Regal verbalized understanding of this. Patient has been faxed out to SNFs in [facility], and CSW will update when bed offers are made.  Maryclare Labrador, MSW, [facility] Clinical Social Worker [phone]

## 2013-03-07 NOTE — Progress Notes (Signed)
ANTIBIOTIC CONSULT NOTE - INITIAL  Pharmacy Consult for vancomycin and Zosyn Indication: suspected pneumonia  Allergies  Allergen Reactions  . Statins Other (See Comments)    Causes leg cramps and muscle weakness    Patient Measurements: Height: 5\' 6"  (167.6 cm) Weight: 142 lb 6.7 oz (64.6 kg) IBW/kg (Calculated) : 59.3  Vital Signs: Temp: 98.9 F (37.2 C) (09/30 1100) Temp src: Oral (09/30 1100) BP: 116/60 mmHg (09/30 1100) Pulse Rate: 76 (09/30 1100) Intake/Output from previous day: 09/29 0701 - 09/30 0700 In: 2636.3 [P.O.:720; I.V.:1316.3; IV Piggyback:600] Out: 300 [Urine:300] Intake/Output from this shift: Total I/O In: 480 [P.O.:480] Out: 150 [Urine:150]  Labs:  Recent Labs  03/05/13 0856  03/06/13 1537 03/06/13 1538 03/06/13 1836 03/07/13 0415  WBC 24.5*  < >  --  22.9* 20.8* 16.7*  HGB 14.0  < >  --  13.2 12.2 11.4*  PLT 231  < >  --  194 172 184  CREATININE 0.83  --  0.85  --   --  0.81  < > = values in this interval not displayed. Estimated Creatinine Clearance: 44.9 ml/min (by C-G formula based on Cr of 0.81). No results found for this basename: VANCOTROUGH, Leodis Binet, VANCORANDOM, GENTTROUGH, GENTPEAK, GENTRANDOM, TOBRATROUGH, TOBRAPEAK, TOBRARND, AMIKACINPEAK, AMIKACINTROU, AMIKACIN,  in the last 72 hours   Microbiology: Recent Results (from the past 720 hour(s))  SURGICAL PCR SCREEN     Status: Abnormal   Collection Time    02/16/13 12:13 PM      Result Value Range Status   MRSA, PCR NEGATIVE  NEGATIVE Final   Staphylococcus aureus POSITIVE (*) NEGATIVE Final   Comment:            The Xpert SA Assay (FDA     approved for NASAL specimens     in patients over 40 years of age),     is one component of     a comprehensive surveillance     program.  Test performance has     been validated by [employer] for patients greater     than or equal to 40 year old.     It is not intended     to diagnose infection nor to     guide or monitor  treatment.  URINE CULTURE     Status: None   Collection Time    02/20/13 11:50 PM      Result Value Range Status   Specimen Description URINE, CATHETERIZED   Final   Special Requests NONE   Final   Culture  Setup Time     Final   Value: 02/21/2013 01:10     Performed at [facility] Count     Final   Value: NO GROWTH     Performed at [facility]   Culture     Final   Value: NO GROWTH     Performed at [facility]   Report Status 02/23/2013 FINAL   Final  CULTURE, BLOOD (ROUTINE X 2)     Status: None   Collection Time    03/05/13  3:44 PM      Result Value Range Status   Specimen Description BLOOD RIGHT HAND   Final   Special Requests BOTTLES DRAWN AEROBIC AND ANAEROBIC 10CC   Final   Culture NO GROWTH 2 DAYS   Final   Report Status PENDING   Incomplete  CULTURE, BLOOD (ROUTINE X 2)  Status: None   Collection Time    03/05/13  3:44 PM      Result Value Range Status   Specimen Description BLOOD LEFT HAND   Final   Special Requests BOTTLES DRAWN AEROBIC AND ANAEROBIC 10CC   Final   Culture NO GROWTH 2 DAYS   Final   Report Status PENDING   Incomplete  MRSA PCR SCREENING     Status: None   Collection Time    03/05/13  5:56 PM      Result Value Range Status   MRSA by PCR NEGATIVE  NEGATIVE Final   Comment:            The GeneXpert MRSA Assay (FDA     approved for NASAL specimens     only), is one component of a     comprehensive MRSA colonization     surveillance program. It is not     intended to diagnose MRSA     infection nor to guide or     monitor treatment for     MRSA infections.    Medical History: Past Medical History  Diagnosis Date  . GERD (gastroesophageal reflux disease)        . Hyperlipidemia   . Hypertension   . Osteoarthritis of lumbar spine   . PONV (postoperative nausea and vomiting) 2009    after total hip  . Deviated nasal septum     pt had septoplasty  . Nasal turbinate hypertrophy     bilateral  .  Cancer     nasal cancer  . Cancer     cancer in the nose and in lower jaw  . Atrial fibrillation   . Dizziness   . Itching   . Diffuse large B cell lymphoma     Dr. Mariel Sleet -Nasal cavity, right sided lymph adenopathy  . OAB (overactive bladder)   . 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 amLODipine (NORVASC) 5 MG tablet Take 1 tablet (5 mg total) by mouth daily.  180 tablet  1  . Calcium-Vitamin D (CALTRATE 600 PLUS-VIT D PO) Take 1 tablet by mouth daily.      . clobetasol cream (TEMOVATE) 0.05 % Apply 1 application topically 2 (two) times daily.       Marland Kitchen dexlansoprazole (DEXILANT) 60 MG capsule Take 60 mg by mouth daily.      . Multiple Vitamins-Minerals (CENTRUM SILVER ULTRA WOMENS) TABS Take 1 tablet by mouth daily.        Marland Kitchen warfarin (COUMADIN) 4 MG tablet Take 2-4 mg by mouth daily. Patient takes 2mg  on Mon.,Wed.,Fri.,and 4mg  all other days.      . cephALEXin (KEFLEX) 500 MG capsule Take 500 mg by mouth 3 (three) times daily.       Assessment: 79 yo female who presented to the [facility] ED with SOB and large volume coffee ground emesis on chronic Coumadin for Afib. Patient was given Theodoro Parma to reverse Coumadin and INR is at baseline now.  She had been on azithromycin and ceftriaxone since 9/28 for CAP but pharmacy consulted to begin vancomycin and Zosyn for suspected HCAP. Patient is afebrile today but Tmax 100.1, WBC are elevated, and cultures are negative thus far.  Antibiotics: Azithromycin 9/28>> Ceftriaxone 9/28>>9/30  Cultures: 9/28 BCx2 - ngtd 9/28 MRSA PCR negative  Goal of Therapy:  Vancomycin trough level 15-20 mcg/ml  Plan:  -Vancomycin  1000 mg IV now then 500 mg IV q12h -Zosyn 3.375 g IV q8h to be infused over 4 hours -Monitor renal function, clinical course, and culture data -Vancomycin trough as indicated  [facility], [address].D., BCPS Clinical  Pharmacist Pager: [phone] 03/07/2013 1:47 PM

## 2013-03-07 NOTE — Evaluation (Signed)
Occupational Therapy Evaluation Patient Details Name: Sierra Good MRN: [id number] DOB: 1922/08/31 Today's Date: 03/07/2013 Time: 8657-8469 OT Time Calculation (min): 31 min  OT Assessment / Plan / Recommendation History of present illness Sierra Good is a 79 y.o. female with history of atrial fibrillation who recently had a pacemaker placed approximately 2 weeks ago for symptomatic bradycardia. Patient and family reports that she has felt short of breath for quite some time now (weeks to months). Her shortness of breath has not gotten any worse than normal. She's not had any chest pain. She did feel feverish last night and had associated diaphoresis. She's had a nonproductive cough overnight as well. Approximately 2 days ago, patient had episode of emesis which was brown in color. She's had increasing nausea since then. Her by mouth intake has been poor. She did not have any emesis yesterday, but this morning she began having coffee-ground emesis. She's had 3 large volume emesis thus far. The patient was at home and fell over while reaching into her dryer. She may have fallen onto her right side although she is not sure. The patient has had pain in her right shoulder for quite some time now which she attributes to arthritis. After her pacemaker, she was not moving her right shoulder due to pacemaker precautions. After 1 week, she tried to move her shoulder but was unable to do so. This pain in her shoulder appears to have gotten worse after her fall. Imaging in the ER has been unremarkable. During her stay in the ER, she has 2 witnessed episodes of large-volume hematemesis. Her heart rate, which was normal on admission, has changed to atrial fibrillation with a heart rate in the 120s to 130s. Due to lack of GI coverage over the weekend, the patient will be transferred to [facility], for further treatments.   Clinical Impression   This 79 yo female admitted with above presents to acute OT with  problems below. Pt is greatly limited by pain in RUE, overall mobility, and sitting balance. Will benefit from acute OT with follow up at SNF.    OT Assessment  Patient needs continued OT Services    Follow Up Recommendations  SNF    Barriers to Discharge Decreased caregiver support    Equipment Recommendations  None recommended by OT       Frequency  Min 2X/week    Precautions / Restrictions Precautions Precautions: Fall;ICD/Pacemaker Precaution Comments: pt now with right shoulder issues, prior arthritis, then pace maker, then fall Restrictions Weight Bearing Restrictions: No   Pertinent Vitals/Pain FACES (6/10) RUE with movement; repositioned    ADL  Transfers/Ambulation Related to ADLs: max A sit EOB to squat pivot to recliner going to pt's right ADL Comments: Pt currently total A for all BADLs in any position due to RUE deficits and decreased EOB balance, and general lethargy. Did support her RUE while she attempted to comb her hair with her RUE--needed for support to hold her arm up and then still needed max A to complete task--even when she tried to use her LUE. Daughter reports that the pt did try to feed herself some lunch with her RUE. Encouraged daughter to encourage pt to try and feed herself at supper with perhaps having to put pt's plate in her lap while seated in the chair. Pt to sit up in recliner for 30 minutes with feet down and then daughter will raise her feet up.    OT Diagnosis: Generalized weakness;Acute pain  OT Problem  List: Decreased strength;Decreased range of motion;Decreased activity tolerance;Impaired balance (sitting and/or standing);Decreased knowledge of use of DME or AE;Pain;Impaired UE functional use OT Treatment Interventions: Self-care/ADL training;Balance training;Therapeutic exercise;Therapeutic activities;DME and/or AE instruction;Patient/family education   OT Goals(Current goals can be found in the care plan section) Acute Rehab OT  Goals Patient Stated Goal: home OT Goal Formulation: With patient/family Time For Goal Achievement: 03/21/13 Potential to Achieve Goals: Good  Visit Information  Last OT Received On: 03/07/13 Assistance Needed: +2 (for ambulation) History of Present Illness: Sierra Good is a 79 y.o. female with history of atrial fibrillation who recently had a pacemaker placed approximately 2 weeks ago for symptomatic bradycardia. Patient and family reports that she has felt short of breath for quite some time now (weeks to months). Her shortness of breath has not gotten any worse than normal. She's not had any chest pain. She did feel feverish last night and had associated diaphoresis. She's had a nonproductive cough overnight as well. Approximately 2 days ago, patient had episode of emesis which was brown in color. She's had increasing nausea since then. Her by mouth intake has been poor. She did not have any emesis yesterday, but this morning she began having coffee-ground emesis. She's had 3 large volume emesis thus far. The patient was at home and fell over while reaching into her dryer. She may have fallen onto her right side although she is not sure. The patient has had pain in her right shoulder for quite some time now which she attributes to arthritis. After her pacemaker, she was not moving her right shoulder due to pacemaker precautions. After 1 week, she tried to move her shoulder but was unable to do so. This pain in her shoulder appears to have gotten worse after her fall. Imaging in the ER has been unremarkable. During her stay in the ER, she has 2 witnessed episodes of large-volume hematemesis. Her heart rate, which was normal on admission, has changed to atrial fibrillation with a heart rate in the 120s to 130s. Due to lack of GI coverage over the weekend, the patient will be transferred to [facility], for further treatments.       Prior Functioning     Home Living Family/patient expects to be  discharged to:: Skilled nursing facility Living Arrangements: Alone Additional Comments: pt strongly desires to return home however family aware pt can not return home alone Prior Function Level of Independence: Needs assistance Gait / Transfers Assistance Needed: used walker since pacemaker placement, was indep prior to 2 weeks ago ADL's / Homemaking Assistance Needed: assist for all dressing/bathing since pacemaker placement Comments: per family pt with rapid decline since pacemaker placement Communication Communication: No difficulties Dominant Hand: Left         Vision/Perception Vision - History Patient Visual Report: No change from baseline   Cognition  Cognition Arousal/Alertness: Lethargic Behavior During Therapy: WFL for tasks assessed/performed Overall Cognitive Status: Within Functional Limits for tasks assessed    Extremity/Trunk Assessment Upper Extremity Assessment Upper Extremity Assessment: RUE deficits/detail RUE Deficits / Details: Pt with decreased use of RUE at shoulder due to OA, pacemaker 2 weeks ago, and now fall. Has no AROM and PROM to about 20 degrees with pain for shoulder flexion; PROM 50 degrees shoulder abduction with pain, and AAROM full internal/external rotation without pain. RUE Coordination: decreased gross motor     Mobility Bed Mobility Bed Mobility: Supine to Sit;Sitting - Scoot to Edge of Bed Supine to Sit: 2: Max assist;HOB elevated  Sitting - Scoot to [place] of Bed: 2: Max assist Details for Bed Mobility Assistance: maxA for trunk elevation, pt able to move LEs toward EOB Transfers Transfers: Sit to Stand;Stand to Sit (partial) Sit to Stand: 2: Max assist;Without upper extremity assist;From bed Stand to Sit: 1: +1 Total assist;Without upper extremity assist;To chair/3-in-1 Details for Transfer Assistance: pt with increased knee/hip flexion, shuffle steps, maxA to achieve anterior weight shift. unable to achieve full upright posture         Balance Balance Balance Assessed: Yes Static Sitting Balance Static Sitting - Balance Support: Bilateral upper extremity supported;Feet supported Static Sitting - Level of Assistance: 3: Mod assist Static Sitting - Comment/# of Minutes: Pt with tendency to lean posteriorly. VCs to lean forward, however when hand removed from her back she starts leaning posteriorly with only minimally initiating trying to correct   End of Session OT - End of Session Equipment Utilized During Treatment: Gait belt Activity Tolerance: Patient limited by fatigue;Patient limited by lethargy Patient left: in chair;with call bell/phone within reach;with family/visitor present Nurse Communication: Mobility status (NT)       Evette Georges [phone] 03/07/2013, 4:15 PM

## 2013-03-07 NOTE — Progress Notes (Signed)
     Sierra Good Rounding Note 03/07/2013, 9:34 AM  SUBJECTIVE:       Feels weak, unable to move her right shoulder.  No nausea, no dark stools.  Not very hungry  OBJECTIVE:         Vital signs in last 24 hours:    Temp:  [99 F (37.2 C)-100.1 F (37.8 C)] 99 F (37.2 C) (09/30 0806) Pulse Rate:  [64-89] 66 (09/30 0806) Resp:  [15-34] 26 (09/30 0806) BP: (80-127)/(42-73) 126/60 mmHg (09/30 0806) SpO2:  [93 %-98 %] 97 % (09/30 0911) Weight:  [64.6 kg (142 lb 6.7 oz)] 64.6 kg (142 lb 6.7 oz) (09/30 0412) Last BM Date: 04-04-13 General: looks tired and frail   Heart: RRR Chest: clear bil.  + cough.  No resting dyspnea Abdomen: soft, NT, ND.  BS hypoactive  Extremities: no CCE Neuro/Psych:  Pleasant, depressed, cooperative.  No confusion.   Intake/Output from previous day: 09/29 0701 - 09/30 0700 In: 2636.3 [P.O.:720; I.V.:1316.3; IV Piggyback:600] Out: 300 [Urine:300]  Intake/Output this shift: Total I/O In: 480 [P.O.:480] Out: 150 [Urine:150]  Lab Results:  Recent Labs  03/06/13 1538 03/06/13 1836 03/07/13 0415  WBC 22.9* 20.8* 16.7*  HGB 13.2 12.2 11.4*  HCT 39.3 36.0 33.1*  PLT 194 172 184   BMET  Recent Labs  Apr 04, 2013 0856 03/06/13 1537 03/07/13 0415  NA 137 135 137  K 4.0 3.5 4.1  CL 96 97 102  CO2 26 25 25   GLUCOSE 150* 105* 106*  BUN 24* 18 13  CREATININE 0.83 0.85 0.81  CALCIUM 10.8* 9.8 9.1   LFT  Recent Labs  2013/04/04 0930 03/07/13 0415  PROT 6.6 5.8*  ALBUMIN 4.0 2.6*  AST 20 13  ALT 13 7  ALKPHOS 75 79  BILITOT 0.9 0.4  BILIDIR 0.2  --   IBILI 0.7  --    PT/INR  Recent Labs  03/06/13 1537 03/07/13 0415  LABPROT 14.2 14.2  INR 1.12 1.12   Hepatitis Panel No results found for this basename: HEPBSAG, HCVAB, HEPAIGM, HEPBIGM,  in the last 72 hours  Studies/Results: Dg Chest 1 View 04/04/13   .  IMPRESSION: Enlargement of cardiac silhouette.  Question COPD changes with mild patchy left basilar infiltrate  predominately left lower lobe.   Electronically Signed   By: Ulyses Southward M.D.   On: 04/04/13 10:04     ASSESMENT: * CG emesis and Melenic stool  EGD 9/30:  Non-obstructing Schatski ring, gastric antral nodule, 3 cm HH.   Hx "refractory" GERD per Dr Luvenia Starch 05/2012 note.  EGD with dilatation on 05/23/12. schatski ring and HH noted.  * Normocytic anemia. Baseline is about 14.0  *  ? PNA:  On empiric Rocephin, not clear this is confirmed dx.  * Bradycardia. Atrial fib. S/p dual chamber pacemaker 9/10  * Chronic Coumadin. INR Subtherapeutic.  * Chronic dyspnea.  * Shoulder pain, post pacemaker and more recent fall at home. No fracture on x rays.  * Hx TVA with HGD colon polyp 10/2008, required right hemicolectomy. Follow up colonoscopy 01/2010, no Recurrent or residual polyps . Next due 8/16, health permitting.      PLAN: *  Await gastric pathology. Once Good PPI:  Dexilant. *  May restart Coumadin  *  Follow up with [name], not Dr Rhea Belton or Dr Loreta Ave  *  Will sign off     LOS: 2 days   Sierra Good  03/07/2013, 9:34 AM Pager: [phone]

## 2013-03-07 NOTE — Evaluation (Addendum)
Physical Therapy Evaluation Patient Details Name: Sierra Good MRN: [id number] DOB: 1922/11/02 Today's Date: 03/07/2013 Time: 9562-1308 PT Time Calculation (min): 35 min  PT Assessment / Plan / Recommendation History of Present Illness  Sierra Good is a 79 y.o. female with history of atrial fibrillation who recently had a pacemaker placed approximately 2 weeks ago for symptomatic bradycardia. Patient and family reports that she has felt short of breath for quite some time now (weeks to months). Her shortness of breath has not gotten any worse than normal. She's not had any chest pain. She did feel feverish last night and had associated diaphoresis. She's had a nonproductive cough overnight as well. Approximately 2 days ago, patient had episode of emesis which was brown in color. She's had increasing nausea since then. Her by mouth intake has been poor. She did not have any emesis yesterday, but this morning she began having coffee-ground emesis. She's had 3 large volume emesis thus far. The patient was at home and fell over while reaching into her dryer. She may have fallen onto her right side although she is not sure. The patient has had pain in her right shoulder for quite some time now which she attributes to arthritis. After her pacemaker, she was not moving her right shoulder due to pacemaker precautions. After 1 week, she tried to move her shoulder but was unable to do so. This pain in her shoulder appears to have gotten worse after her fall. Imaging in the ER has been unremarkable. During her stay in the ER, she has 2 witnessed episodes of large-volume hematemesis. Her heart rate, which was normal on admission, has changed to atrial fibrillation with a heart rate in the 120s to 130s. Due to lack of GI coverage over the weekend, the patient will be transferred to [facility], for further treatments.  Clinical Impression  Pt extremely deconditioned requiring maxA for all transfers, ambulation and  ADLs. Pt also now with R frozen shld due to R UE being immobile due to pain s/p R pacemaker placement. Patient con't to desire to return home however family well aware pt unsafe to return home alone. Family reports they are unable to physically provide 24/7 assist and unable to financially hire 24/7 caregiving. Spoke with family extensively regarding their questions concerning medical and functional progression, as well as discharge planning. Family desires pt to go to [facility] in [place]. Spoke with case management.    PT Assessment  Patient needs continued PT services    Follow Up Recommendations  SNF;Supervision/Assistance - 24 hour    Does the patient have the potential to tolerate intense rehabilitation      Barriers to Discharge Decreased caregiver support family unable to provide 24/7 assist    Equipment Recommendations   (TBD)    Recommendations for Other Services     Frequency Min 3X/week    Precautions / Restrictions Precautions Precautions: Fall;ICD/Pacemaker (R pacemaker placement 2 wks ago) Precaution Comments: pt now with R frozen shoulder, pt with fall over weekend at home Restrictions Weight Bearing Restrictions: No   Pertinent Vitals/Pain Denies pain      Mobility  Bed Mobility Bed Mobility: Supine to Sit;Sitting - Scoot to Edge of Bed Supine to Sit: 2: Max assist;HOB elevated Sitting - Scoot to [place] of Bed: 3: Mod assist Details for Bed Mobility Assistance: maxA for trunk elevation, pt able to move LEs toward EOB Transfers Transfers: Sit to Stand;Stand to [employer] Transfers Sit to Stand: 2: Max assist;From bed;With  upper extremity assist Stand to Sit: 3: Mod assist;With upper extremity assist;To chair/3-in-1 Stand Pivot Transfers: 2: Max assist Details for Transfer Assistance: pt with increased knee/hip flexion, shuffle steps, maxA to achieve anterior weight shift. unable to achieve full upright posture Ambulation/Gait Ambulation/Gait  Assistance: 1: +2 Total assist Ambulation/Gait: Patient Percentage: 70% Ambulation Distance (Feet): 5 Feet Assistive device: Rolling walker Ambulation/Gait Assistance Details: extremely labored, R antalgic limp, unsteady, easily fatigue, 2nd person for chair follow Gait Pattern: Step-to pattern;Decreased stride length;Shuffle;Antalgic;Narrow base of support;Trunk flexed Gait velocity: slow General Gait Details: pt extremely unsteady    Exercises     PT Diagnosis: Difficulty walking;Generalized weakness  PT Problem List: Decreased strength;Decreased range of motion;Decreased activity tolerance;Decreased balance;Decreased mobility PT Treatment Interventions: DME instruction;Gait training;Functional mobility training;Therapeutic activities;Therapeutic exercise;Stair training     PT Goals(Current goals can be found in the care plan section) Acute Rehab PT Goals Patient Stated Goal: home PT Goal Formulation: With patient/family Time For Goal Achievement: 03/21/13 Potential to Achieve Goals: Good  Visit Information  Last PT Received On: 03/07/13 Assistance Needed: +2 (for ambulation) History of Present Illness: Sierra Good is a 79 y.o. female with history of atrial fibrillation who recently had a pacemaker placed approximately 2 weeks ago for symptomatic bradycardia. Patient and family reports that she has felt short of breath for quite some time now (weeks to months). Her shortness of breath has not gotten any worse than normal. She's not had any chest pain. She did feel feverish last night and had associated diaphoresis. She's had a nonproductive cough overnight as well. Approximately 2 days ago, patient had episode of emesis which was brown in color. She's had increasing nausea since then. Her by mouth intake has been poor. She did not have any emesis yesterday, but this morning she began having coffee-ground emesis. She's had 3 large volume emesis thus far. The patient was at home and  fell over while reaching into her dryer. She may have fallen onto her right side although she is not sure. The patient has had pain in her right shoulder for quite some time now which she attributes to arthritis. After her pacemaker, she was not moving her right shoulder due to pacemaker precautions. After 1 week, she tried to move her shoulder but was unable to do so. This pain in her shoulder appears to have gotten worse after her fall. Imaging in the ER has been unremarkable. During her stay in the ER, she has 2 witnessed episodes of large-volume hematemesis. Her heart rate, which was normal on admission, has changed to atrial fibrillation with a heart rate in the 120s to 130s. Due to lack of GI coverage over the weekend, the patient will be transferred to [facility], for further treatments.       Prior Functioning  Home Living Family/patient expects to be discharged to:: Skilled nursing facility Living Arrangements: Alone Additional Comments: pt strongly desires to return home however family aware pt can not return home alone Prior Function Level of Independence: Needs assistance Gait / Transfers Assistance Needed: used walker since pacemaker placement, was indep prior to 2 weeks ago ADL's / Homemaking Assistance Needed: assist for all dressing/bathing since pacemaker placement Comments: per family pt with rapid decline since pacemaker placement Communication Communication: No difficulties Dominant Hand: Left    Cognition  Cognition Arousal/Alertness: Awake/alert Behavior During Therapy: WFL for tasks assessed/performed Overall Cognitive Status: Within Functional Limits for tasks assessed    Extremity/Trunk Assessment Upper Extremity Assessment Upper Extremity Assessment: RUE  deficits/detail RUE Deficits / Details: noted R frozen shld from being immobile from pain from pacemaker placement 2 weeks ago Lower Extremity Assessment Lower Extremity Assessment: Generalized weakness Cervical /  Trunk Assessment Cervical / Trunk Assessment: Normal   Balance    End of Session PT - End of Session Equipment Utilized During Treatment: Gait belt;Oxygen Activity Tolerance: Patient limited by fatigue Patient left: in chair;with call bell/phone within reach;with family/visitor present Nurse Communication: Mobility status  GP     Marcene Brawn 03/07/2013, 1:27 PM  Lewis Shock, PT, DPT Pager #: [phone] Office #: [phone]

## 2013-03-07 NOTE — Clinical Social Work Psychosocial (Signed)
Clinical Social Work Department BRIEF PSYCHOSOCIAL ASSESSMENT 03/07/2013  Patient:  Sierra Good, Sierra Good     Account Number:  [id number]     Admit date:  03/05/2013  Clinical Social Worker:  Varney Biles  Date/Time:  03/07/2013 11:53 AM  Referred by:  Physician  Date Referred:  03/07/2013 Referred for  Psychosocial assessment   Other Referral:   Patient will likely need SNF/ALF placement; PT assessment pending.   Interview type:  Patient Other interview type:   CSW met with patient and patient's daughter, Jearld Lesch, and her son-in-law, Azucena Kuba Corrie Dandy K's husband).    PSYCHOSOCIAL DATA Living Status:  ALONE Admitted from facility:   Level of care:  Independent Living Primary support name:  Jearld Lesch Primary support relationship to patient:  CHILD, ADULT Degree of support available:   Good--patient's daughter and son-in-law provide support to the patient. Patient also had a home health adie prior to her hospital admission. Family helps with running errands, taking patient to doctor visits, etc.    CURRENT CONCERNS Current Concerns  Post-Acute Placement   Other Concerns:    SOCIAL WORK ASSESSMENT / PLAN CSW met with patient and patient's daughter and son-in-law, who were at bedside. Patient was sitting up in a chair when CSW walked into the room. Patient says she is feeling weak, and looked to her daughter to answer CSW questions. Patient's daughter, Jearld Lesch, explained that patient had a stent put in 2 weeks ago, and that her mother is overwhelmed by her recent health issues. Per Jearld Lesch, patient does not move one arm well, and this troubles patient. CSW explained that PT will evaluate the patient, and she will likely need a SNF or ALF placement upon discharge. Daughter understanding of this, and expressed that the family would prefer her mother does not go to [facility] had a bad experience there after hip surgery in the past. Jearld Lesch wishes to have the patient placed in [facility], if possible. CSW explained that placement depends on insurance and bed offers, but CSW will do her best to accomodate family's wishes. Patient and family understanding of this.   Assessment/plan status:  Psychosocial Support/Ongoing Assessment of Needs Other assessment/ plan:   Information/referral to community resources:   SNF or ALF; PT assessment pending.    PATIENT'S/FAMILY'S RESPONSE TO PLAN OF CARE: Patient and family receptive to CSW introduction and CSW role. Jearld Lesch provided phone numbers for family members: Jearld Lesch [phone]; Jearld Lesch [phone] Regal (patient's other daughter) [phone]; Okey Regal [phone].       Maryclare Labrador, MSW, [facility] Clinical Social Worker [phone]

## 2013-03-07 NOTE — Progress Notes (Signed)
Patient seen, examined, and I agree with the above documentation, including the assessment and plan. No significant UGI pathology identified to explain recent hematemesis Bx taken from antral nodule for completeness, likely benign Ok to resume warfarin If further hematemesis consider ENT consult to ensure no more proximal source of blood loss given her hx of nasopharyngeal lymphoma Pt reports she has followup with her ENT MD in Oct Call with questions

## 2013-03-07 NOTE — Progress Notes (Addendum)
[facility] Progress Note [place] TEAM 1 - Stepdown/ICU TEAM   Chauncy Lean ZOX:[id number] DOB: [birth year] DOA: 03/05/2013 PCP: [name], MD  Admit HPI / Brief Narrative: 79 y.o. female with history of atrial fibrillation who had a pacemaker placed approximately 2 weeks ago for symptomatic bradycardia. Patient and family reported that she had felt short of breath for weeks to months. Her shortness of breath had not gotten any worse than normal. She'd not had any chest pain. She did feel feverish and had associated diaphoresis. Patient had 3 large volume coffee ground emesis episodes the day of her admit.    During her stay in the ER, she has 2 witnessed episodes of large-volume hematemesis. Her heart rate, which was normal on admission, changed to atrial fibrillation with a heart rate in the 120s to 130s. Due to lack of GI coverage over the weekend, the patient was transferred to [facility] for further treatments.  Assessment/Plan:  Coffee ground emesis / melena GI following - EGD unrevelaing - cont PPI - follow serial CBC - of note INR was not supratherapeutic, and rapidly normalized over first 24hrs of care - gi recommends to resume coumadin and f/u with [name] if needed  LLL CAP with acute hypoxic respiratory failure - severe cough on exam and coarse crackles in LLL today -d/c Rocephin- start Vanc and Cefepime for HCAP and cont coverage with Zithro as she was home for a while as well.  - no requiring O2 - can likely go home with doxy and augmentin  Chronic "refractory" GERD Cont PPI -   Parox Afib w/ RVR On chronic coumadin tx (on hold)- can resume - rate controlled on 5mg /hr of cardizem - start oral long acting cardizem - 120 mg - follow on tele   DH Improving - cont gentle hydration - PO intake poor  R shoulder pain / frozen shoulder Has baseline arthritis - held shoulder in splint with minimal/no movement due to PPM - Xrays w/o fx - begin PT/OT to improve  ROM  History of B-Cell Lymphoma Previously under care of [name] - completed chemo-therapy about 2 years ago - appears to be in remission  Code Status: NO CODE Family Communication: none today- Dr Dolphus Jenny spoke with 2 daughters on 9/29 Disposition Plan: SDU until we are sure she is stable off of Cardizem infusion- might be able to tx 10/1- SNF bed search Consultants: GI  Procedures: EGD - 9/29 - no clear source of blood loss - nodule was found in the gastric antrum; multiple biopsies  Antibiotics: azithro 9/28 >> Rocephin 9/28 >>9/230 Vanc 9/30 Cefepime 9/30  DVT prophylaxis: SCDs  HPI/Subjective: Pt is resting comfortably.  States she has a poor appetite but will try to eat.   Objective: Blood pressure 116/60, pulse 76, temperature 98.9 F (37.2 C), temperature source Oral, resp. rate 27, height 5\' 6"  (1.676 m), weight 64.6 kg (142 lb 6.7 oz), SpO2 96.00%.  Intake/Output Summary (Last 24 hours) at 03/07/13 1330 Last data filed at 03/07/13 0800  Gross per 24 hour  Intake 3116.25 ml  Output    350 ml  Net 2766.25 ml   Exam: General: AAO x 3 - comfortable Lungs: coarse crackle in LLL- pt having severe cough when asked to take deep breaths for exam Cardiovascular: Irregular rate without murmur gallop or rub normal S1 and S2 Abdomen: Nontender, nondistended, soft, bowel sounds positive, no rebound, no ascites, no appreciable mass Extremities: No significant cyanosis, clubbing, or edema bilateral lower  extremities  Data Reviewed: Basic Metabolic Panel:  Recent Labs Lab 03/05/13 0856 03/06/13 1537 03/07/13 0415  NA 137 135 137  K 4.0 3.5 4.1  CL 96 97 102  CO2 26 25 25   GLUCOSE 150* 105* 106*  BUN 24* 18 13  CREATININE 0.83 0.85 0.81  CALCIUM 10.8* 9.8 9.1   Liver Function Tests:  Recent Labs Lab 03/05/13 0930 03/07/13 0415  AST 20 13  ALT 13 7  ALKPHOS 75 79  BILITOT 0.9 0.4  PROT 6.6 5.8*  ALBUMIN 4.0 2.6*   No results found for this basename:  LIPASE, AMYLASE,  in the last 168 hours No results found for this basename: AMMONIA,  in the last 168 hours CBC:  Recent Labs Lab 03/05/13 0856 03/05/13 2100 03/06/13 0050 03/06/13 1538 03/06/13 1836 03/07/13 0415  WBC 24.5* 34.7* 30.8* 22.9* 20.8* 16.7*  NEUTROABS 23.3*  --   --   --   --   --   HGB 14.0 13.0 12.7 13.2 12.2 11.4*  HCT 41.7 37.0 35.4* 39.3 36.0 33.1*  MCV 95.0 94.4 93.4 95.9 95.2 94.6  PLT 231 215 190 194 172 184   Cardiac Enzymes:  Recent Labs Lab 03/05/13 0856 03/05/13 1510 03/06/13 0238  TROPONINI <0.30 <0.30 <0.30   BNP (last 3 results)  Recent Labs  03/05/13 0926  PROBNP 163.7     Recent Results (from the past 240 hour(s))  CULTURE, BLOOD (ROUTINE X 2)     Status: None   Collection Time    03/05/13  3:44 PM      Result Value Range Status   Specimen Description BLOOD RIGHT HAND   Final   Special Requests BOTTLES DRAWN AEROBIC AND ANAEROBIC 10CC   Final   Culture NO GROWTH 2 DAYS   Final   Report Status PENDING   Incomplete  CULTURE, BLOOD (ROUTINE X 2)     Status: None   Collection Time    03/05/13  3:44 PM      Result Value Range Status   Specimen Description BLOOD LEFT HAND   Final   Special Requests BOTTLES DRAWN AEROBIC AND ANAEROBIC 10CC   Final   Culture NO GROWTH 2 DAYS   Final   Report Status PENDING   Incomplete  MRSA PCR SCREENING     Status: None   Collection Time    03/05/13  5:56 PM      Result Value Range Status   MRSA by PCR NEGATIVE  NEGATIVE Final   Comment:            The GeneXpert MRSA Assay (FDA     approved for NASAL specimens     only), is one component of a     comprehensive MRSA colonization     surveillance program. It is not     intended to diagnose MRSA     infection nor to guide or     monitor treatment for     MRSA infections.     Studies:  Recent x-ray studies have been reviewed in detail by the Attending Physician  Scheduled Meds:  Scheduled Meds: . azithromycin  500 mg Intravenous Q24H   . diltiazem  120 mg Oral Daily  . levalbuterol  0.63 mg Nebulization TID  . pantoprazole  40 mg Oral Q0600    Time spent on care of this patient: 35 mins   Calvert Cantor, MD  [facility] Office  [phone] Pager - Text Page per Loretha Stapler as per below:  On-Call/Text Page:      [email or web address]      password [email or web address]  If 7PM-7AM, please contact night-coverage [email or web address] Password [email or web address] 03/07/2013, 1:30 PM   LOS: 2 days

## 2013-03-07 NOTE — Care Management Note (Signed)
    Page 1 of 1   03/07/2013     12:26:45 PM   CARE MANAGEMENT NOTE 03/07/2013  Patient:  Sierra Good, Sierra Good   Account Number:  [id number]  Date Initiated:  03/06/2013  Documentation initiated by:  Zanyiah Posten  Subjective/Objective Assessment:   adm with dx of GI bleed, AFib w/RVR; lives alone, has aide 3 x wk     In-house referral  Clinical Social Worker      DC Associate Professor  CM consult      Per UR Regulation:  Reviewed for med. necessity/level of care/duration of stay  Comments:  PCP  Dr Doreen Beam  Contact: [address] dtr #[phone](J), [phone])  03/07/13 2956 Verdis Prime RN MSN BSN CCM PT recommends ST-SNF for rehab, pt and dtr request [facility]. CSW aware.

## 2013-03-08 LAB — BASIC METABOLIC PANEL
Calcium: 8.7 mg/dL (ref 8.4–10.5)
Creatinine, Ser: 0.69 mg/dL (ref 0.50–1.10)
GFR calc Af Amer: 87 mL/min — ABNORMAL LOW (ref >90)
GFR calc non Af Amer: 75 mL/min — ABNORMAL LOW (ref >90)
Glucose, Bld: 90 mg/dL (ref 70–99)

## 2013-03-08 LAB — PROTIME-INR: INR: 1.13 (ref 0.00–1.49)

## 2013-03-08 LAB — CBC
HCT: 31.7 % — ABNORMAL LOW (ref 36.0–46.0)
MCH: 32.8 pg (ref 26.0–34.0)
MCV: 93 fL (ref 78.0–100.0)
Platelets: 171 10*3/uL (ref 150–400)
RDW: 14.4 % (ref 11.5–15.5)

## 2013-03-08 MED ORDER — WARFARIN SODIUM 4 MG PO TABS
4.0000 mg | ORAL_TABLET | Freq: Once | ORAL | Status: AC
  Administered 2013-03-08: 4 mg via ORAL
  Filled 2013-03-08: qty 1

## 2013-03-08 MED ORDER — POTASSIUM CHLORIDE CRYS ER 20 MEQ PO TBCR
40.0000 meq | EXTENDED_RELEASE_TABLET | Freq: Once | ORAL | Status: AC
  Administered 2013-03-08: 40 meq via ORAL
  Filled 2013-03-08 (×2): qty 2

## 2013-03-08 MED ORDER — WARFARIN - PHARMACIST DOSING INPATIENT
Freq: Every day | Status: DC
  Administered 2013-03-08: 19:00:00

## 2013-03-08 NOTE — Progress Notes (Signed)
[facility] Progress Note [place] TEAM 1 - Stepdown/ICU TEAM   Chauncy Lean ZOX:[id number] DOB: August 23, 1922 DOA: 03/05/2013 PCP: [name], MD  Admit HPI / Brief Narrative: 79 y.o. female with history of atrial fibrillation who had a pacemaker placed approximately 2 weeks ago for symptomatic bradycardia. Patient and family reported that she had felt short of breath for weeks to months. Her shortness of breath had not gotten any worse than normal. She'd not had any chest pain. She did feel feverish and had associated diaphoresis. Patient had 3 large volume coffee ground emesis episodes the day of her admit.    During her stay in the ER, she has 2 witnessed episodes of large-volume hematemesis. Her heart rate, which was normal on admission, changed to atrial fibrillation with a heart rate in the 120s to 130s. Due to lack of GI coverage over the weekend, the patient was transferred to [facility] for further treatments.  Assessment/Plan:  Coffee ground emesis / melena GI has evaluated - EGD unrevealing - cont PPI - serial CBC reveals stable Hgb - of note INR was not supratherapeutic, and rapidly normalized over first 24hrs of care - GI cleared to resume coumadin and f/u with [name] if needed - no evidence of recurrent bleeding   ?LLL CAP with acute hypoxic respiratory failure Cont empiric abx tx - follow clinically - perhaps pt suffered a limited episode of aspiration - exam 9/30 was reportedly more c/w a true PNA - cont empiric abx tx  - WBC improving   Chronic "refractory" GERD Cont PPI   Parox Afib w/ RVR On chronic coumadin tx - rate controlled - remains on cardizem gtt and long acting oral at this time - stop gtt today and monitor HR - if remains controlled will be ready for d/c in next 24hrs   DH Clinically resolved w/ volume resuscitation  R shoulder pain / frozen shoulder Has baseline arthritis - held shoulder in splint with minimal/no movement due to PPM - Xrays w/o fx -  PT/OT continues to improve ROM  Mild hypokalemia Replace and recheck in AM  History of B-Cell Lymphoma Previously under care of [name] - completed chemo-therapy about 2 years ago - appears to be in remission - if hematemesis recurs, will need ENT eval to r/o more proximal source for blood loss  Code Status: NO CODE Family Communication: no family present at time of exam today  Disposition Plan: SDU while on cardizem gtt - stop gtt today - follow HR response - possible ready for SNF 10/2  Consultants: GI  Procedures: EGD - 9/29 - no clear source of blood loss - nodule was found in the gastric antrum; multiple biopsies  Antibiotics: azithro 9/28 >>9/30 Rocephin 9/28 >>9/29 Zosyn 9/30>> Vanc 9/30>>  DVT prophylaxis: SCDs  HPI/Subjective: Pt is resting comfortably.  No further emesis since admit.  Denies abdom pain, cp, or sob. Admits that she feels very weak and agrees to need for a rehab SNF stay.  Objective: Blood pressure 108/50, pulse 64, temperature 97.7 F (36.5 C), temperature source Oral, resp. rate 25, height 5\' 6"  (1.676 m), weight 64.6 kg (142 lb 6.7 oz), SpO2 97.00%.  Intake/Output Summary (Last 24 hours) at 03/08/13 1308 Last data filed at 03/08/13 1000  Gross per 24 hour  Intake 1770.25 ml  Output    300 ml  Net 1470.25 ml   Exam: General: No acute respiratory distress Lungs: Clear to auscultation bilaterally without wheezes or crackles Cardiovascular: Regular rate  without murmur gallop or rub normal S1 and S2 Abdomen: Nontender, nondistended, soft, bowel sounds positive, no rebound, no ascites, no appreciable mass Extremities: No significant cyanosis, clubbing, edema bilateral lower extremities  Data Reviewed: Basic Metabolic Panel:  Recent Labs Lab 03/05/13 0856 03/06/13 1537 03/07/13 0415 03/08/13 0545  NA 137 135 137 138  K 4.0 3.5 4.1 3.2*  CL 96 97 102 105  CO2 26 25 25 21   GLUCOSE 150* 105* 106* 90  BUN 24* 18 13 12   CREATININE  0.83 0.85 0.81 0.69  CALCIUM 10.8* 9.8 9.1 8.7   Liver Function Tests:  Recent Labs Lab 03/05/13 0930 03/07/13 0415  AST 20 13  ALT 13 7  ALKPHOS 75 79  BILITOT 0.9 0.4  PROT 6.6 5.8*  ALBUMIN 4.0 2.6*   CBC:  Recent Labs Lab 03/05/13 0856  03/06/13 0050 03/06/13 1538 03/06/13 1836 03/07/13 0415 03/08/13 0545  WBC 24.5*  < > 30.8* 22.9* 20.8* 16.7* 12.9*  NEUTROABS 23.3*  --   --   --   --   --   --   HGB 14.0  < > 12.7 13.2 12.2 11.4* 11.2*  HCT 41.7  < > 35.4* 39.3 36.0 33.1* 31.7*  MCV 95.0  < > 93.4 95.9 95.2 94.6 93.0  PLT 231  < > 190 194 172 184 171  < > = values in this interval not displayed. Cardiac Enzymes:  Recent Labs Lab 03/05/13 0856 03/05/13 1510 03/06/13 0238  TROPONINI <0.30 <0.30 <0.30   BNP (last 3 results)  Recent Labs  03/05/13 0926  PROBNP 163.7     Recent Results (from the past 240 hour(s))  CULTURE, BLOOD (ROUTINE X 2)     Status: None   Collection Time    03/05/13  3:44 PM      Result Value Range Status   Specimen Description BLOOD RIGHT HAND   Final   Special Requests BOTTLES DRAWN AEROBIC AND ANAEROBIC 10CC   Final   Culture NO GROWTH 3 DAYS   Final   Report Status PENDING   Incomplete  CULTURE, BLOOD (ROUTINE X 2)     Status: None   Collection Time    03/05/13  3:44 PM      Result Value Range Status   Specimen Description BLOOD LEFT HAND   Final   Special Requests BOTTLES DRAWN AEROBIC AND ANAEROBIC 10CC   Final   Culture NO GROWTH 3 DAYS   Final   Report Status PENDING   Incomplete  MRSA PCR SCREENING     Status: None   Collection Time    03/05/13  5:56 PM      Result Value Range Status   MRSA by PCR NEGATIVE  NEGATIVE Final   Comment:            The GeneXpert MRSA Assay (FDA     approved for NASAL specimens     only), is one component of a     comprehensive MRSA colonization     surveillance program. It is not     intended to diagnose MRSA     infection nor to guide or     monitor treatment for     MRSA  infections.     Studies:  Recent x-ray studies have been reviewed in detail by the Attending Physician  Scheduled Meds:  Scheduled Meds: . azithromycin  500 mg Intravenous Q24H  . diltiazem  120 mg Oral Daily  . pantoprazole  40 mg Oral  [id number]  . piperacillin-tazobactam (ZOSYN)  IV  3.375 g Intravenous Q8H  . vancomycin  500 mg Intravenous Q12H    Time spent on care of this patient: 35 mins   [facility]  [facility] Office  [phone] Pager - Text Page per Loretha Stapler as per below:  On-Call/Text Page:      [email or web address]      password [email or web address]  If 7PM-7AM, please contact night-coverage [email or web address] Password [email or web address] 03/08/2013, 1:08 PM   LOS: 3 days

## 2013-03-08 NOTE — Clinical Social Work Note (Addendum)
2:00pm, 03/08/13: CSW received bed offers from [place], [facility], and [facility]. CSW presented these offers to patient; patient selected [facility]. CSW called [facility] and left admissions rep a voicemail confirming that patient will take this bed when she is medically ready for discharge. CSW invited SNF rep to call her if she has any questions. Patient asked CSW to call her daughter, Jearld Lesch. CSW left Jearld Lesch a voicemail, updating her on SNF discharge plans and inviting Jearld Lesch to call if she has any questions.   CSW called [facility] ([place] ([place]), and [facility] [place]). [facility] and [facility] responded "considering" concerning a bed offer for patient, and [facility] and [facility] had not responded. [facility] and [facility] assumed that patient was receiving chemo; CSW informed facilities that patient is not receiving chemo. Facilities will review patient's chart again, and make a decision. [facility] and [facility] are reviewing patient's chart and will notify CSW when they make a decision. CSW will follow and update patient & family as soon as possible.   Maryclare Labrador, MSW, [facility] Clinical Social Worker [phone]

## 2013-03-08 NOTE — Plan of Care (Cosign Needed)
K+ 3.2- K dur 40 meq ordered.  Junious Silk, ANP

## 2013-03-08 NOTE — Progress Notes (Signed)
ANTICOAGULATION CONSULT NOTE - Initial Consult  Pharmacy Consult for Coumadin Indication: atrial fibrillation  Allergies  Allergen Reactions  . Statins Other (See Comments)    Causes leg cramps and muscle weakness   Labs:  Recent Labs  03/06/13 0238 03/06/13 1537  03/06/13 1836 03/07/13 0415 03/08/13 0545  HGB  --   --   < > 12.2 11.4* 11.2*  HCT  --   --   < > 36.0 33.1* 31.7*  PLT  --   --   < > 172 184 171  LABPROT  --  14.2  --   --  14.2 14.3  INR  --  1.12  --   --  1.12 1.13  CREATININE  --  0.85  --   --  0.81 0.69  TROPONINI <0.30  --   --   --   --   --   < > = values in this interval not displayed.  Estimated Creatinine Clearance: 45.5 ml/min (by C-G formula based on Cr of 0.69).   Medical History: Past Medical History  Diagnosis Date  . GERD (gastroesophageal reflux disease)        . Hyperlipidemia   . Hypertension   . Osteoarthritis of lumbar spine   . PONV (postoperative nausea and vomiting) 2009    after total hip  . Deviated nasal septum     pt had septoplasty  . Nasal turbinate hypertrophy     bilateral  . Cancer     nasal cancer  . Cancer     cancer in the nose and in lower jaw  . Atrial fibrillation   . Dizziness   . Itching   . Diffuse large B cell lymphoma     Dr. Mariel Sleet -Nasal cavity, right sided lymph adenopathy  . OAB (overactive bladder)   . 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  Dose PTA = 2 mg MWF, 4 mg other days  Goal of Therapy:  INR 2-3 Monitor platelets by anticoagulation protocol: Yes   Plan:  1) Coumadin 4 mg po x 1 dose today at 1800 pm 2) Daily INR  Thank you. Okey Regal, PharmD [phone]  03/08/2013,3:50 PM

## 2013-03-08 NOTE — Clinical Social Work Note (Signed)
Clinical Social Work Department CLINICAL SOCIAL WORK PLACEMENT NOTE 03/08/2013  Patient:  Sierra Good, Sierra Good  Account Number:  [id number] Admit date:  03/05/2013  Clinical Social Worker:  Maryclare Labrador, Theresia Majors  Date/time:  03/07/2013 03:00 PM  Clinical Social Work is seeking post-discharge placement for this patient at the following level of care:   SKILLED NURSING   (*CSW will update this form in Epic as items are completed)   03/07/2013  Patient/family provided with [facility] Department of Clinical Social Work's list of facilities offering this level of care within the geographic area requested by the patient (or if unable, by the patient's family).  03/07/2013  Patient/family informed of their freedom to choose among providers that offer the needed level of care, that participate in Medicare, Medicaid or managed care program needed by the patient, have an available bed and are willing to accept the patient.  03/07/2013  Patient/family informed of [facility]' ownership interest in [facility], as well as of the fact that they are under no obligation to receive care at this facility.  PASARR submitted to EDS on 03/07/2013 PASARR number received from EDS on 03/07/2013  FL2 transmitted to all facilities in geographic area requested by pt/family on  03/07/2013 FL2 transmitted to all facilities within larger geographic area on   Patient informed that his/her managed care company has contracts with or will negotiate with  certain facilities, including the following:     Patient/family informed of bed offers received:   Patient chooses bed at  Physician recommends and patient chooses bed at    Patient to be transferred to  on   Patient to be transferred to facility by   The following physician request were entered in Epic:   Additional Comments:   Maryclare Labrador, MSW, [facility] Clinical Social Worker [phone]

## 2013-03-09 ENCOUNTER — Encounter: Payer: Self-pay | Admitting: Internal Medicine

## 2013-03-09 DIAGNOSIS — C8581 Other specified types of non-Hodgkin lymphoma, lymph nodes of head, face, and neck: Secondary | ICD-10-CM

## 2013-03-09 LAB — CBC
MCH: 32.7 pg (ref 26.0–34.0)
MCV: 93 fL (ref 78.0–100.0)
Platelets: 212 10*3/uL (ref 150–400)
RBC: 3.42 MIL/uL — ABNORMAL LOW (ref 3.87–5.11)
RDW: 14.4 % (ref 11.5–15.5)
WBC: 11.8 10*3/uL — ABNORMAL HIGH (ref 4.0–10.5)

## 2013-03-09 LAB — BASIC METABOLIC PANEL
BUN: 12 mg/dL (ref 6–23)
CO2: 21 mEq/L (ref 19–32)
Calcium: 8.6 mg/dL (ref 8.4–10.5)
Creatinine, Ser: 0.7 mg/dL (ref 0.50–1.10)
GFR calc non Af Amer: 75 mL/min — ABNORMAL LOW (ref >90)
Sodium: 135 mEq/L (ref 135–145)

## 2013-03-09 LAB — PROTIME-INR: Prothrombin Time: 14.9 seconds (ref 11.6–15.2)

## 2013-03-09 MED ORDER — BOOST PLUS PO LIQD
237.0000 mL | Freq: Three times a day (TID) | ORAL | Status: DC
  Administered 2013-03-09 – 2013-03-10 (×3): 237 mL via ORAL
  Filled 2013-03-09 (×7): qty 237

## 2013-03-09 MED ORDER — WARFARIN SODIUM 4 MG PO TABS
4.0000 mg | ORAL_TABLET | Freq: Once | ORAL | Status: AC
  Administered 2013-03-09: 4 mg via ORAL
  Filled 2013-03-09: qty 1

## 2013-03-09 NOTE — Discharge Summary (Addendum)
Physician Discharge Summary  Sierra Good NWG:[id number] DOB: 12-25-1922 DOA: 03/05/2013  PCP: [name], MD  Admit date: 03/05/2013 Discharge date: 03/09/2013  Time spent: >45 minutes  NOTE- medications will need to be updated  Discharge Diagnoses:  Principal Problem:   GI bleed Active Problems:   HYPERTENSION   Long term (current) use of anticoagulants   Pacemaker   Right shoulder pain   Atrial fibrillation with RVR   Community acquired pneumonia   Acute respiratory failure   Dehydration   Leukocytosis   Discharge Condition: stable  Diet recommendation: low sodium  Filed Weights   03/06/13 0500 03/07/13 0412 03/09/13 0500  Weight: 64.6 kg (142 lb 6.7 oz) 64.6 kg (142 lb 6.7 oz) 65.6 kg (144 lb 10 oz)    History of present illness:  79 y.o. female with history of atrial fibrillation who had a pacemaker placed approximately 2 weeks ago for symptomatic bradycardia and was discharged home.  Patient and family reported that she had felt short of breath for weeks to months. Her shortness of breath had not gotten any worse than normal. She'd not had any chest pain. She did feel feverish and had associated diaphoresis. She was admitted to the [name] for hematemesis which started at home that morning.  Patient had 3 large volume coffee ground emesis episodes the day of her admit. She also fell at home and felt she may have injured her right shoulder. Prior to the fall, she has having trouble moving the shoulder as well (was in a sling) Her heart rate, which was normal on admission, changed to atrial fibrillation with a heart rate in the 120s to 130s. Due to lack of GI coverage over the weekend, the patient was transferred to [name] for further treatments.   Hospital Course:  Coffee ground emesis / melena  GI has evaluated - EGD unrevealing - cont PPI - serial CBC reveals stable Hgb - of note INR was not supratherapeutic, and rapidly normalized over first 24hrs of  care - GI cleared to resume coumadin and f/u with [name] if needed - no evidence of recurrent bleeding   LLL HCAP with acute hypoxic respiratory failure  Cont empiric abx tx - follow clinically - perhaps pt suffered a limited episode of aspiration -  -d/c'd Rocephin which was ordered on admission - started Vanc and Cefepime for HCAP  - no requiring O2  - can likely go to SNF with doxy and augmentin   Chronic "refractory" GERD  Cont PPI   Parox Afib w/ RVR  On chronic coumadin tx - rate controlled on PO Cardizem now   [facility]  Clinically resolved w/ volume resuscitation  - still receiving 50 cc/hr- will need to encourage increased PO intake- added [name]-   R shoulder pain / frozen shoulder  Has baseline arthritis - held shoulder in splint with minimal/no movement due to PPM - Xrays w/o fx - PT/OT last evaluated on 9/30 by OT and PT   Mild hypokalemia  Replaced   History of B-Cell Lymphoma  Previously under care of [name] - completed chemo-therapy about 2 years ago - appears to be in remission - if hematemesis recurs, will need ENT eval to r/o more proximal source for blood loss      Procedures: EGD 9/29-ENDOSCOPIC IMPRESSION:  1. Schatzki ring was found at the gastroesophageal junction  2. 3 cm hiatal hernia  3. Nodule was found in the gastric antrum; multiple biopsies  4. The stomach  otherwise appeared normal  5. The duodenal mucosa showed no abnormalities in the bulb and  second portion of the duodenum    Consultations:  GI  Discharge Exam: Filed Vitals:   03/09/13 1551  BP: 114/49  Pulse: 67  Temp: 98.7 F (37.1 C)  Resp: 24    General: AAO x 3 Cardiovascular: IIRR, no murmurs Respiratory: Crackles in LLL- no O2 requirements-   Discharge Instructions   Future Appointments Provider Department Dept Phone   04/20/2013 9:00 AM [facility] Covering Provider [name] [phone]   05/15/2013 11:30 AM [name], MD [name] [phone]       Medication List    ASK your doctor about these medications       amLODipine 5 MG tablet  Commonly known as:  NORVASC  Take 1 tablet (5 mg total) by mouth daily.     CALTRATE 600 PLUS-VIT D PO  Take 1 tablet by mouth daily.     CENTRUM SILVER ULTRA WOMENS Tabs  Take 1 tablet by mouth daily.     cephALEXin 500 MG capsule  Commonly known as:  KEFLEX  Take 500 mg by mouth 3 (three) times daily.     clobetasol cream 0.05 %  Commonly known as:  TEMOVATE  Apply 1 application topically 2 (two) times daily.     DEXILANT 60 MG capsule  Generic drug:  dexlansoprazole  Take 60 mg by mouth daily.     warfarin 4 MG tablet  Commonly known as:  COUMADIN  Take 2-4 mg by mouth daily. Patient takes 2mg  on Mon.,Wed.,Fri.,and 4mg  all other days.       Allergies  Allergen Reactions  . Statins Other (See Comments)    Causes leg cramps and muscle weakness       Follow-up Information   Follow up with [name], MD. (call office for appointment to follow up stomach issues. )    Specialty:  Gastroenterology   Contact information:   [address] [place] [address] [phone]        The results of significant diagnostics from this hospitalization (including imaging, microbiology, ancillary and laboratory) are listed below for reference.    Significant Diagnostic Studies: Dg Chest 1 View  03/05/2013   CLINICAL DATA:  Larey Seat 2 days ago, anterior right shoulder and right humeral pain, initial encounter  EXAM: CHEST - 1 VIEW  COMPARISON:  02/20/2013  FINDINGS: Right subclavian sequential transvenous pacemaker leads project at right atrium and right ventricle.  Enlargement of cardiac silhouette.  Atherosclerotic calcification aorta.  Mediastinal contours and pulmonary vascularity normal.  Emphysematous and bronchitic changes compatible with COPD.  Question mild patchy infiltrate in lower left lung, predominately lower lobe.   Remaining lungs clear.  No pleural effusion or pneumothorax.  Bones demineralized.  IMPRESSION: Enlargement of cardiac silhouette.  Question COPD changes with mild patchy left basilar infiltrate predominately left lower lobe.   Electronically Signed   By: Ulyses Southward M.D.   On: 03/05/2013 10:04   Dg Chest 2 View  02/20/2013   CLINICAL DATA:  Right chest and right upper extremity pain status post pacemaker insertion.  EXAM: CHEST  2 VIEW  COMPARISON:  02/17/2013  FINDINGS: Right anterior chest wall sequential pacemaker is stable. Is leads project within the right atrium right ventricle. There is no pneumothorax.  Cardiac silhouette is normal in size and configuration. The mediastinum is normal in contour. There are no hilar masses.  There is mild central bronchitic change as well as mild apical lung scarring, stable. The lungs are otherwise clear.  The bony thorax is demineralized but grossly intact.  IMPRESSION: No acute cardiopulmonary disease.  No radiographic evidence of a complication from pacemaker placement.   Electronically Signed   By: Amie Portland   On: 02/20/2013 23:21   Dg Chest 2 View  02/17/2013   *RADIOLOGY REPORT*  Clinical Data: Sore arm post pacemaker.  CHEST - 2 VIEW  Comparison: 04/24/2011.  Findings: Sequential pacemaker placed from the right.  Leads appear uninterrupted with the tips in the region of the right atrium and right ventricle.  Heart size within normal limits.  No pneumothorax or evidence of significant pleural effusion.  Mild elevation of the right hemidiaphragm without significant change.  Biapical pleural thickening without associated bony destruction.  No segmental infiltrate or pulmonary edema.  No thoracic compression deformity.  IMPRESSION: Sequential pacemaker placed from the right.  Leads appear uninterrupted with the tips in the region of the right atrium and right ventricle.  Heart size within normal limits.  No pneumothorax or evidence of significant pleural  effusion.  Mild elevation of the right hemidiaphragm without significant change.   Original Report Authenticated By: Lacy Duverney, M.D.   Dg Shoulder Right  03/05/2013   CLINICAL DATA:  Right shoulder in humeral pain post fall 2 days ago, initial encounter  EXAM: RIGHT SHOULDER - 2+ VIEW  COMPARISON:  None  FINDINGS: Osseous demineralization.  AC joint alignment normal with cranial spur formation.  Narrowed acromiohumeral interval, which may predispose the patient to rotator cuff pathology.  No acute fracture, dislocation, or bone destruction.  Visualized right ribs intact.  Pacemaker generator right chest wall.  IMPRESSION: Osseous demineralization with mild AC joint degenerative changes.  No acute abnormalities.   Electronically Signed   By: Ulyses Southward M.D.   On: 03/05/2013 10:06   Dg Humerus Right  03/05/2013   CLINICAL DATA:  Pain right humerus and shoulder post fall 2 days ago, initial encounter  EXAM: RIGHT HUMERUS - 2+ VIEW  COMPARISON:  None  FINDINGS: Osseous demineralization.  Shoulder and elbow joint alignments grossly normal.  No acute fracture, dislocation, or bone destruction.  IMPRESSION: No acute osseous abnormalities.   Electronically Signed   By: Ulyses Southward M.D.   On: 03/05/2013 10:07    Microbiology: Recent Results (from the past 240 hour(s))  CULTURE, BLOOD (ROUTINE X 2)     Status: None   Collection Time    03/05/13  3:44 PM      Result Value Range Status   Specimen Description BLOOD RIGHT HAND   Final   Special Requests BOTTLES DRAWN AEROBIC AND ANAEROBIC 10CC   Final   Culture NO GROWTH 4 DAYS   Final   Report Status PENDING   Incomplete  CULTURE, BLOOD (ROUTINE X 2)     Status: None   Collection Time    03/05/13  3:44 PM      Result Value Range Status   Specimen Description BLOOD LEFT HAND   Final   Special Requests BOTTLES DRAWN AEROBIC AND ANAEROBIC 10CC   Final   Culture NO GROWTH 4 DAYS   Final   Report Status PENDING   Incomplete  MRSA PCR SCREENING      Status: None   Collection Time    03/05/13  5:56 PM      Result Value Range Status   MRSA by PCR NEGATIVE  NEGATIVE Final  Comment:            The GeneXpert MRSA Assay (FDA     approved for NASAL specimens     only), is one component of a     comprehensive MRSA colonization     surveillance program. It is not     intended to diagnose MRSA     infection nor to guide or     monitor treatment for     MRSA infections.     Labs: Basic Metabolic Panel:  Recent Labs Lab 03/05/13 0856 03/06/13 1537 03/07/13 0415 03/08/13 0545 03/09/13 0535  NA 137 135 137 138 135  K 4.0 3.5 4.1 3.2* 3.7  CL 96 97 102 105 102  CO2 26 25 25 21 21   GLUCOSE 150* 105* 106* 90 106*  BUN 24* 18 13 12 12   CREATININE 0.83 0.85 0.81 0.69 0.70  CALCIUM 10.8* 9.8 9.1 8.7 8.6   Liver Function Tests:  Recent Labs Lab 03/05/13 0930 03/07/13 0415  AST 20 13  ALT 13 7  ALKPHOS 75 79  BILITOT 0.9 0.4  PROT 6.6 5.8*  ALBUMIN 4.0 2.6*   No results found for this basename: LIPASE, AMYLASE,  in the last 168 hours No results found for this basename: AMMONIA,  in the last 168 hours CBC:  Recent Labs Lab 03/05/13 0856  03/06/13 1538 03/06/13 1836 03/07/13 0415 03/08/13 0545 03/09/13 0535  WBC 24.5*  < > 22.9* 20.8* 16.7* 12.9* 11.8*  NEUTROABS 23.3*  --   --   --   --   --   --   HGB 14.0  < > 13.2 12.2 11.4* 11.2* 11.2*  HCT 41.7  < > 39.3 36.0 33.1* 31.7* 31.8*  MCV 95.0  < > 95.9 95.2 94.6 93.0 93.0  PLT 231  < > 194 172 184 171 212  < > = values in this interval not displayed. Cardiac Enzymes:  Recent Labs Lab 03/05/13 0856 03/05/13 1510 03/06/13 0238  TROPONINI <0.30 <0.30 <0.30   BNP: BNP (last 3 results)  Recent Labs  03/05/13 0926  PROBNP 163.7   CBG: No results found for this basename: GLUCAP,  in the last 168 hours     Signed:  [facility]  [facility] 03/09/2013, 6:04 PM

## 2013-03-09 NOTE — Progress Notes (Signed)
[facility] Progress Note [place] TEAM 1 - Stepdown/ICU TEAM   Chauncy Lean WRU:[id number] DOB: 09-Feb-1923 DOA: 03/05/2013 PCP: [name], MD  Admit HPI / Brief Narrative: 79 y.o. female with history of atrial fibrillation who had a pacemaker placed approximately 2 weeks ago for symptomatic bradycardia. Patient and family reported that she had felt short of breath for weeks to months. Her shortness of breath had not gotten any worse than normal. She'd not had any chest pain. She did feel feverish and had associated diaphoresis. Patient had 3 large volume coffee ground emesis episodes the day of her admit.    During her stay in the ER, she has 2 witnessed episodes of large-volume hematemesis. Her heart rate, which was normal on admission, changed to atrial fibrillation with a heart rate in the 120s to 130s. Due to lack of GI coverage over the weekend, the patient was transferred to [facility] for further treatments.  Assessment/Plan:  Coffee ground emesis / melena GI has evaluated - EGD unrevealing - cont PPI - serial CBC reveals stable Hgb - of note INR was not supratherapeutic, and rapidly normalized over first 24hrs of care - GI cleared to resume coumadin and f/u with [name] if needed - no evidence of recurrent bleeding   ?LLL CAP with acute hypoxic respiratory failure Cont empiric abx tx - follow clinically - perhaps pt suffered a limited episode of aspiration -  -d/c'd Rocephin - started Vanc and Cefepime for HCAP - no requiring O2  - can likely go to SNF with doxy and augmentin  Chronic "refractory" GERD Cont PPI   Parox Afib w/ RVR On chronic coumadin tx - rate controlled on PO Cardizem now  [facility] Clinically resolved w/ volume resuscitation - still receiving 50 cc/hr- will need to encourage increased PO intake- added [name]- will d/c infusion in AM  R shoulder pain / frozen shoulder Has baseline arthritis - held shoulder in splint with minimal/no movement due to  PPM - Xrays w/o fx - PT/OT last evaluated on 9/30 by OT and PT  Mild hypokalemia Replaced  History of B-Cell Lymphoma Previously under care of [name] - completed chemo-therapy about 2 years ago - appears to be in remission - if hematemesis recurs, will need ENT eval to r/o more proximal source for blood loss  Code Status: NO CODE Family Communication: discussed with 2 daugthers today Disposition Plan: SNF on 10/3  Consultants: GI  Procedures: EGD - 9/29 - no clear source of blood loss - nodule was found in the gastric antrum; multiple biopsies  Antibiotics: azithro 9/28 >>9/30 Rocephin 9/28 >>9/29 Zosyn 9/30>> Vanc 9/30>>  DVT prophylaxis: SCDs  HPI/Subjective: Pt is feeling weak- not eating much- daughters note OT has not seen pt to work on arm since 9/30.   Objective: Blood pressure 114/49, pulse 67, temperature 98.7 F (37.1 C), temperature source Oral, resp. rate 24, height 5\' 6"  (1.676 m), weight 65.6 kg (144 lb 10 oz), SpO2 96.00%.  Intake/Output Summary (Last 24 hours) at 03/09/13 1647 Last data filed at 03/09/13 1554  Gross per 24 hour  Intake    690 ml  Output    501 ml  Net    189 ml   Exam: General: No acute respiratory distress Lungs: crackles in LLL Cardiovascular: Irregular rate without murmur gallop or rub normal S1 and S2 Abdomen: Nontender, nondistended, soft, bowel sounds positive, no rebound, no ascites, no appreciable mass Extremities: No significant cyanosis, clubbing, edema bilateral lower extremities  Data Reviewed: Basic Metabolic Panel:  Recent Labs Lab 03/05/13 0856 03/06/13 1537 03/07/13 0415 03/08/13 0545 03/09/13 0535  NA 137 135 137 138 135  K 4.0 3.5 4.1 3.2* 3.7  CL 96 97 102 105 102  CO2 26 25 25 21 21   GLUCOSE 150* 105* 106* 90 106*  BUN 24* 18 13 12 12   CREATININE 0.83 0.85 0.81 0.69 0.70  CALCIUM 10.8* 9.8 9.1 8.7 8.6   Liver Function Tests:  Recent Labs Lab 03/05/13 0930 03/07/13 0415  AST 20 13  ALT  13 7  ALKPHOS 75 79  BILITOT 0.9 0.4  PROT 6.6 5.8*  ALBUMIN 4.0 2.6*   CBC:  Recent Labs Lab 03/05/13 0856  03/06/13 1538 03/06/13 1836 03/07/13 0415 03/08/13 0545 03/09/13 0535  WBC 24.5*  < > 22.9* 20.8* 16.7* 12.9* 11.8*  NEUTROABS 23.3*  --   --   --   --   --   --   HGB 14.0  < > 13.2 12.2 11.4* 11.2* 11.2*  HCT 41.7  < > 39.3 36.0 33.1* 31.7* 31.8*  MCV 95.0  < > 95.9 95.2 94.6 93.0 93.0  PLT 231  < > 194 172 184 171 212  < > = values in this interval not displayed. Cardiac Enzymes:  Recent Labs Lab 03/05/13 0856 03/05/13 1510 03/06/13 0238  TROPONINI <0.30 <0.30 <0.30   BNP (last 3 results)  Recent Labs  03/05/13 0926  PROBNP 163.7     Recent Results (from the past 240 hour(s))  CULTURE, BLOOD (ROUTINE X 2)     Status: None   Collection Time    03/05/13  3:44 PM      Result Value Range Status   Specimen Description BLOOD RIGHT HAND   Final   Special Requests BOTTLES DRAWN AEROBIC AND ANAEROBIC 10CC   Final   Culture NO GROWTH 4 DAYS   Final   Report Status PENDING   Incomplete  CULTURE, BLOOD (ROUTINE X 2)     Status: None   Collection Time    03/05/13  3:44 PM      Result Value Range Status   Specimen Description BLOOD LEFT HAND   Final   Special Requests BOTTLES DRAWN AEROBIC AND ANAEROBIC 10CC   Final   Culture NO GROWTH 4 DAYS   Final   Report Status PENDING   Incomplete  MRSA PCR SCREENING     Status: None   Collection Time    03/05/13  5:56 PM      Result Value Range Status   MRSA by PCR NEGATIVE  NEGATIVE Final   Comment:            The GeneXpert MRSA Assay (FDA     approved for NASAL specimens     only), is one component of a     comprehensive MRSA colonization     surveillance program. It is not     intended to diagnose MRSA     infection nor to guide or     monitor treatment for     MRSA infections.     Studies:  Recent x-ray studies have been reviewed in detail by the Attending Physician  Scheduled Meds:  Scheduled  Meds: . diltiazem  120 mg Oral Daily  . pantoprazole  40 mg Oral Q0600  . piperacillin-tazobactam (ZOSYN)  IV  3.375 g Intravenous Q8H  . vancomycin  500 mg Intravenous Q12H  . warfarin  4 mg Oral ONCE-1800  . Warfarin -  Pharmacist Dosing Inpatient   Does not apply q1800    Time spent on care of this patient: 35 mins   [facility]  [facility] Office  [phone] Pager - Text Page per Loretha Stapler as per below:  On-Call/Text Page:      [email or web address]      password [email or web address]  If 7PM-7AM, please contact night-coverage [email or web address] Password [email or web address] 03/09/2013, 4:47 PM   LOS: 4 days

## 2013-03-09 NOTE — Clinical Social Work Note (Signed)
Pt's daughter Jearld Lesch requested CSW get in touch with [facility] SNF for placement upon discharge. CSW called [facility], who informed CSW that they should have a bed for pt tomorrow afternoon. CSW provided Jearld Lesch with the phone number for Elease Hashimoto, admissions rep at [facility]. Jearld Lesch will contact Elease Hashimoto.   Maryclare Labrador, MSW, [facility] Clinical Social Worker [phone]

## 2013-03-09 NOTE — Progress Notes (Signed)
ANTICOAGULATION CONSULT NOTE  Pharmacy Consult for Coumadin Indication: atrial fibrillation  Allergies  Allergen Reactions  . Statins Other (See Comments)    Causes leg cramps and muscle weakness   Labs:  Recent Labs  03/07/13 0415 03/08/13 0545 03/09/13 0535  HGB 11.4* 11.2* 11.2*  HCT 33.1* 31.7* 31.8*  PLT 184 171 212  LABPROT 14.2 14.3 14.9  INR 1.12 1.13 1.20  CREATININE 0.81 0.69 0.70    Estimated Creatinine Clearance: 45.5 ml/min (by C-G formula based on Cr of 0.7).   Medical History: Past Medical History  Diagnosis Date  . GERD (gastroesophageal reflux disease)        . Hyperlipidemia   . Hypertension   . Osteoarthritis of lumbar spine   . PONV (postoperative nausea and vomiting) 2009    after total hip  . Deviated nasal septum     pt had septoplasty  . Nasal turbinate hypertrophy     bilateral  . Cancer     nasal cancer  . Cancer     cancer in the nose and in lower jaw  . Atrial fibrillation   . Dizziness   . Itching   . Diffuse large B cell lymphoma     Dr. Mariel Sleet -Nasal cavity, right sided lymph adenopathy  . OAB (overactive bladder)   . 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  INR 1.2 with no further s/s of bleeding.  Dose PTA = 2 mg MWF, 4 mg other days  Goal of Therapy:  INR 2-3 Monitor platelets by anticoagulation protocol: Yes   Plan:  1) Repeat Coumadin 4 mg po x 1 dose today at 1800 pm 2) Daily INR  Sheppard Coil PharmD., BCPS Clinical Pharmacist Pager [phone] 03/09/2013 1:45 PM

## 2013-03-10 ENCOUNTER — Encounter: Payer: Self-pay | Admitting: Internal Medicine

## 2013-03-10 LAB — CBC
HCT: 32.7 % — ABNORMAL LOW (ref 36.0–46.0)
MCHC: 34.3 g/dL (ref 30.0–36.0)
Platelets: 227 10*3/uL (ref 150–400)
RBC: 3.52 MIL/uL — ABNORMAL LOW (ref 3.87–5.11)
RDW: 14.4 % (ref 11.5–15.5)

## 2013-03-10 LAB — BASIC METABOLIC PANEL
BUN: 15 mg/dL (ref 6–23)
Calcium: 8.6 mg/dL (ref 8.4–10.5)
Creatinine, Ser: 1.23 mg/dL — ABNORMAL HIGH (ref 0.50–1.10)
GFR calc Af Amer: 44 mL/min — ABNORMAL LOW (ref >90)
GFR calc non Af Amer: 38 mL/min — ABNORMAL LOW (ref >90)
Potassium: 3.4 mEq/L — ABNORMAL LOW (ref 3.5–5.1)

## 2013-03-10 LAB — PROTIME-INR: INR: 1.22 (ref 0.00–1.49)

## 2013-03-10 LAB — CULTURE, BLOOD (ROUTINE X 2): Culture: NO GROWTH

## 2013-03-10 MED ORDER — DILTIAZEM HCL ER COATED BEADS 120 MG PO CP24: 120.0000 mg | ORAL_CAPSULE | Freq: Every day | ORAL | Status: DC

## 2013-03-10 MED ORDER — BOOST PLUS PO LIQD: 237.0000 mL | Freq: Three times a day (TID) | ORAL | Status: DC

## 2013-03-10 MED ORDER — ACETAMINOPHEN 325 MG PO TABS: 650.0000 mg | ORAL_TABLET | Freq: Four times a day (QID) | ORAL | Status: DC | PRN

## 2013-03-10 MED ORDER — AMOXICILLIN-POT CLAVULANATE 500-125 MG PO TABS
1.0000 | ORAL_TABLET | Freq: Two times a day (BID) | ORAL | Status: DC
Start: 2013-03-10 — End: 2013-05-18

## 2013-03-10 MED ORDER — WARFARIN SODIUM 4 MG PO TABS: 4.0000 mg | ORAL_TABLET | Freq: Every day | ORAL | Status: DC

## 2013-03-10 MED ORDER — ONDANSETRON HCL 4 MG PO TABS: 4.0000 mg | ORAL_TABLET | Freq: Four times a day (QID) | ORAL | Status: DC | PRN

## 2013-03-10 MED ORDER — AMOXICILLIN-POT CLAVULANATE 500-125 MG PO TABS
1.0000 | ORAL_TABLET | Freq: Two times a day (BID) | ORAL | Status: DC
  Filled 2013-03-10 (×2): qty 1

## 2013-03-10 NOTE — Progress Notes (Signed)
ANTICOAGULATION CONSULT NOTE - Follow Up Consult  Pharmacy Consult for Coumadin Indication: atrial fibrillation  Allergies  Allergen Reactions  . Statins Other (See Comments)    Causes leg cramps and muscle weakness    Patient Measurements: Height: 5\' 6"  (167.6 cm) Weight: 145 lb 1 oz (65.8 kg) IBW/kg (Calculated) : 59.3  Vital Signs: Temp: 98.2 F (36.8 C) (10/03 0805) Temp src: Oral (10/03 0805) BP: 140/61 mmHg (10/03 0805) Pulse Rate: 73 (10/03 0805)  Labs:  Recent Labs  03/08/13 0545 03/09/13 0535 03/10/13 0600  HGB 11.2* 11.2* 11.2*  HCT 31.7* 31.8* 32.7*  PLT 171 212 227  LABPROT 14.3 14.9 15.1  INR 1.13 1.20 1.22  CREATININE 0.69 0.70 1.23*    Estimated Creatinine Clearance: 29.6 ml/min (by C-G formula based on Cr of 1.23).   Assessment: Coumadin resumed 10/1 for Afib, INR previously reversed with Kcentra for coffee ground emesis and melenic stool. INR is 1.22 and subtherapeutic. No bleeding noted, CBC stable.  Home dose is 4 mg daily except 2 mg MWF.  Goal of Therapy:  INR 2-3 Monitor platelets by anticoagulation protocol: Yes   Plan:  -Coumadin 4 mg PO tonight -INR daily -Monitor for s/sx bleeding -Possible transfer to SNF today  [facility], [place].D., BCPS Clinical Pharmacist Pager: [phone] 03/10/2013 9:11 AM

## 2013-03-10 NOTE — Clinical Social Work Placement (Signed)
Clinical Social Work Department CLINICAL SOCIAL WORK PLACEMENT NOTE 03/10/2013  Patient:  Sierra Good, Sierra Good  Account Number:  [id number] Admit date:  03/05/2013  Clinical Social Worker:  Maryclare Labrador, Theresia Majors  Date/time:  03/07/2013 03:00 PM  Clinical Social Work is seeking post-discharge placement for this patient at the following level of care:   SKILLED NURSING   (*CSW will update this form in Epic as items are completed)   03/07/2013  Patient/family provided with [facility] Department of Clinical Social Work's list of facilities offering this level of care within the geographic area requested by the patient (or if unable, by the patient's family).  03/07/2013  Patient/family informed of their freedom to choose among providers that offer the needed level of care, that participate in Medicare, Medicaid or managed care program needed by the patient, have an available bed and are willing to accept the patient.  03/07/2013  Patient/family informed of [facility]' ownership interest in [facility], as well as of the fact that they are under no obligation to receive care at this facility.  PASARR submitted to EDS on 03/07/2013 PASARR number received from EDS on 03/07/2013  FL2 transmitted to all facilities in geographic area requested by pt/family on  03/07/2013 FL2 transmitted to all facilities within larger geographic area on   Patient informed that his/her managed care company has contracts with or will negotiate with  certain facilities, including the following:     Patient/family informed of bed offers received:  03/08/2013 Patient chooses bed at [facility] SNF Physician recommends and patient chooses bed at    Patient to be transferred to [facility] SNF on  03/10/2013 Patient to be transferred to facility by [facility]  The following physician request were entered in Epic:   Additional Comments:

## 2013-03-10 NOTE — Discharge Summary (Signed)
Physician Discharge Summary  Chauncy Lean WUJ:[id number] DOB: [birth year] DOA: 03/05/2013  PCP: [name], MD  Admit date: 03/05/2013 Discharge date: 03/10/2013  Time spent: >35 minutes  Discharge Diagnoses:  Coffee ground emesis / melena  ?LLL CAP with acute hypoxic respiratory failure - tx as HCAP Chronic "refractory" GERD  Parox Afib w/ RVR  DH  R shoulder pain / frozen shoulder  Mild hypokalemia  History of B-Cell Lymphoma  Mild renal insufficiency  Discharge Condition: stable  Diet recommendation: low sodium  Follow Up Issues: 1. INR will need to be followed very closely - the patient is being discharged on 4 mg warfarin per day due to the fact that her INR is not yet therapeutic - her prior dosing schedule was 4 mg every day with exception to 2 mg on Monday Wednesday Friday - once her INR is therapeutic she will likely need to decrease to her prior dosing schedule 2.  the patient should have a basic metabolic panel in 2-3 days to recheck potassium as well as creatinine - creatinine was slightly increased at time of discharge as noted below (likely due to vancomycin therapy) 3.  the patient should have a CBC in 2-3 days to assure that her hemoglobin remains stable  Filed Weights   03/07/13 0412 03/09/13 0500 03/10/13 0500  Weight: 64.6 kg (142 lb 6.7 oz) 65.6 kg (144 lb 10 oz) 65.8 kg (145 lb 1 oz)    History of present illness:  79 y.o. female with history of atrial fibrillation who had a pacemaker placed approximately 2 weeks ago for symptomatic bradycardia and was discharged home.  Patient and family reported that she had felt short of breath for weeks to months. Her shortness of breath had not gotten any worse than normal. She'd not had any chest pain. She did feel feverish and had associated diaphoresis. She was admitted to the [facility] for hematemesis which started at home that morning.  Patient had 3 large volume coffee ground emesis episodes the day of  her admit. She also fell at home and felt she may have injured her right shoulder. Prior to the fall, she was having trouble moving the shoulder as well (was in a sling post PPM) Her heart rate, which was normal on admission, changed to atrial fibrillation with a heart rate in the 120s to 130s. Due to lack of GI coverage over the weekend, the patient was transferred to [facility] for further treatment.  Hospital Course:   Coffee ground emesis / melena  GI evaluated - EGD was unrevealing - cont PPI - serial CBC revealed stable Hgb - of note INR was not supratherapeutic, and rapidly normalized over first 24hrs of care - GI cleared to resume coumadin and f/u with [name] if needed - no evidence of recurrent bleeding   LLL HCAP with acute hypoxic respiratory failure  Treated with empiric abx tx - perhaps pt suffered a limited episode of aspiration - Vanc and Cefepime for HCAP - did not require O2 - transition to Augmentin to complete abx tx post d/c   Chronic "refractory" GERD  Cont PPI   Parox Afib w/ RVR  On chronic coumadin tx - rate controlled on PO Cardizem - INR 1.22 on day of d/c - to get 4mg  evening of d/c - home dose is 4 mg daily except 2 mg MWF - received vitamin K during admit, therfore expect recovery of therapeutic INR to be delayed - will require close monitoring in SNF  to determine when to change back to prior home dosing    DH  Clinically resolved w/ volume resuscitation   R shoulder pain / frozen shoulder  Has baseline arthritis - held shoulder in splint with minimal/no movement due to PPM - Xrays w/o fx - PT/OT to continue at SNF   Mild hypokalemia  Replacing - follow as outpt   History of B-Cell Lymphoma  Previously under care of [name] - completed chemo-therapy about 2 years ago - appears to be in remission - if hematemesis recurs, will need ENT eval to r/o more proximal source for blood loss   Mild renal insufficiency Throughout majority of hospital stay creatinine  was stable at approximately 0.8 - on the day of discharge creatinine had increased to 1.23 - this is likely due to poor intake plus vancomycin therapy - oral intake should be encouraged - basic metabolic panel should be assessed in 2-3 days to assure the creatinine is stable/improving  Procedures: EGD 9/29-ENDOSCOPIC IMPRESSION:    1. Schatzki ring was found at the gastroesophageal junction    2. 3 cm hiatal hernia    3. Nodule was found in the gastric antrum; multiple biopsies reveal no remarkable findings    4. The stomach otherwise appeared normal    5. The duodenal mucosa showed no abnormalities in the bulb and  second portion of the duodenum  Consultations: GI  Discharge Exam: Filed Vitals:   03/10/13 1138  BP: 125/72  Pulse: 79  Temp: 98.2 F (36.8 C)  Resp: 26   General: No acute respiratory distress Lungs: Clear to auscultation bilaterally without wheezes or crackles Cardiovascular: Regular rate without murmur gallop or rub normal S1 and S2 Abdomen: Nontender, nondistended, soft, bowel sounds positive, no rebound, no ascites, no appreciable mass Extremities: No significant cyanosis, clubbing, or edema bilateral lower extremities  Discharge Instructions      Discharge Orders   Future Appointments Provider Department Dept Phone   04/20/2013 9:00 AM [facility] Covering Provider [facility] [phone]   05/15/2013 11:30 AM [name], MD [facility] [place] [phone]   Future Orders Complete By Expires   Increase activity slowly  As directed        Medication List    STOP taking these medications       amLODipine 5 MG tablet  Commonly known as:  NORVASC     cephALEXin 500 MG capsule  Commonly known as:  KEFLEX      TAKE these medications       acetaminophen 325 MG tablet  Commonly known as:  TYLENOL  Take 2 tablets (650 mg total) by mouth every 6 (six) hours as needed.     amoxicillin-clavulanate 500-125 MG per tablet  Commonly  known as:  AUGMENTIN  Take 1 tablet (500 mg total) by mouth 2 (two) times daily.     CALTRATE 600 PLUS-VIT D PO  Take 1 tablet by mouth daily.     CENTRUM SILVER ULTRA WOMENS Tabs  Take 1 tablet by mouth daily.     clobetasol cream 0.05 %  Commonly known as:  TEMOVATE  Apply 1 application topically 2 (two) times daily.     DEXILANT 60 MG capsule  Generic drug:  dexlansoprazole  Take 60 mg by mouth daily.     diltiazem 120 MG 24 hr capsule  Commonly known as:  CARDIZEM CD  Take 1 capsule (120 mg total) by mouth daily.     lactose free nutrition Liqd  Take 237 mLs by mouth 3 (three) times daily with meals.     ondansetron 4 MG tablet  Commonly known as:  ZOFRAN  Take 1 tablet (4 mg total) by mouth every 6 (six) hours as needed for nausea.     warfarin 4 MG tablet  Commonly known as:  COUMADIN  Take 1 tablet (4 mg total) by mouth daily. Patient takes 2mg  on Mon.,Wed.,Fri.,and 4mg  all other days.       Allergies  Allergen Reactions  . Statins Other (See Comments)    Causes leg cramps and muscle weakness   Follow-up Information   Follow up with [name], MD. (call office for appointment to follow up stomach issues. )    Specialty:  Gastroenterology   Contact information:   [address] [place] [address] [phone]       Follow up with The MD of record at your SNF/Rehab facility. (your medical care will be provided by the MD of record at your chosen rehab facility)       Significant Diagnostic Studies: Dg Shoulder Right  03/05/2013   CLINICAL DATA:  Right shoulder in humeral pain post fall 2 days ago, initial encounter  EXAM: RIGHT SHOULDER - 2+ VIEW  COMPARISON:  None  FINDINGS: Osseous demineralization.  AC joint alignment normal with cranial spur formation.  Narrowed acromiohumeral interval, which may predispose the patient to rotator cuff pathology.  No acute fracture, dislocation, or bone destruction.  Visualized right ribs  intact.  Pacemaker generator right chest wall.  IMPRESSION: Osseous demineralization with mild AC joint degenerative changes.  No acute abnormalities.   Electronically Signed   By: Ulyses Southward M.D.   On: 03/05/2013 10:06   Dg Humerus Right  03/05/2013   CLINICAL DATA:  Pain right humerus and shoulder post fall 2 days ago, initial encounter  EXAM: RIGHT HUMERUS - 2+ VIEW  COMPARISON:  None  FINDINGS: Osseous demineralization.  Shoulder and elbow joint alignments grossly normal.  No acute fracture, dislocation, or bone destruction.  IMPRESSION: No acute osseous abnormalities.   Electronically Signed   By: Ulyses Southward M.D.   On: 03/05/2013 10:07    Microbiology: Recent Results (from the past 240 hour(s))  CULTURE, BLOOD (ROUTINE X 2)     Status: None   Collection Time    03/05/13  3:44 PM      Result Value Range Status   Specimen Description BLOOD RIGHT HAND   Final   Special Requests BOTTLES DRAWN AEROBIC AND ANAEROBIC 10CC   Final   Culture NO GROWTH 4 DAYS   Final   Report Status PENDING   Incomplete  CULTURE, BLOOD (ROUTINE X 2)     Status: None   Collection Time    03/05/13  3:44 PM      Result Value Range Status   Specimen Description BLOOD LEFT HAND   Final   Special Requests BOTTLES DRAWN AEROBIC AND ANAEROBIC 10CC   Final   Culture NO GROWTH 4 DAYS   Final   Report Status PENDING   Incomplete  MRSA PCR SCREENING     Status: None   Collection Time    03/05/13  5:56 PM      Result Value Range Status   MRSA by PCR NEGATIVE  NEGATIVE Final   Comment:            The GeneXpert MRSA Assay (FDA     approved for NASAL specimens  only), is one component of a     comprehensive MRSA colonization     surveillance program. It is not     intended to diagnose MRSA     infection nor to guide or     monitor treatment for     MRSA infections.     Labs: Basic Metabolic Panel:  Recent Labs Lab 03/06/13 1537 03/07/13 0415 03/08/13 0545 03/09/13 0535 03/10/13 0600  NA 135 137 138  135 137  K 3.5 4.1 3.2* 3.7 3.4*  CL 97 102 105 102 105  CO2 25 25 21 21 19   GLUCOSE 105* 106* 90 106* 122*  BUN 18 13 12 12 15   CREATININE 0.85 0.81 0.69 0.70 1.23*  CALCIUM 9.8 9.1 8.7 8.6 8.6   Liver Function Tests:  Recent Labs Lab 03/05/13 0930 03/07/13 0415  AST 20 13  ALT 13 7  ALKPHOS 75 79  BILITOT 0.9 0.4  PROT 6.6 5.8*  ALBUMIN 4.0 2.6*   CBC:  Recent Labs Lab 03/05/13 0856  03/06/13 1836 03/07/13 0415 03/08/13 0545 03/09/13 0535 03/10/13 0600  WBC 24.5*  < > 20.8* 16.7* 12.9* 11.8* 11.3*  NEUTROABS 23.3*  --   --   --   --   --   --   HGB 14.0  < > 12.2 11.4* 11.2* 11.2* 11.2*  HCT 41.7  < > 36.0 33.1* 31.7* 31.8* 32.7*  MCV 95.0  < > 95.2 94.6 93.0 93.0 92.9  PLT 231  < > 172 184 171 212 227  < > = values in this interval not displayed. Cardiac Enzymes:  Recent Labs Lab 03/05/13 0856 03/05/13 1510 03/06/13 0238  TROPONINI <0.30 <0.30 <0.30   BNP: BNP (last 3 results)  Recent Labs  03/05/13 0926  PROBNP 163.7   Signed:  Evaleen Sant T  [facility] 03/10/2013, 1:03 PM

## 2013-03-20 ENCOUNTER — Ambulatory Visit: Payer: Self-pay | Admitting: Pharmacist Clinician (PhC)/ Clinical Pharmacy Specialist

## 2013-03-20 DIAGNOSIS — Z7901 Long term (current) use of anticoagulants: Secondary | ICD-10-CM

## 2013-03-20 DIAGNOSIS — I4891 Unspecified atrial fibrillation: Secondary | ICD-10-CM

## 2013-04-20 ENCOUNTER — Ambulatory Visit (HOSPITAL_COMMUNITY): Payer: Self-pay

## 2013-05-15 ENCOUNTER — Encounter: Payer: Self-pay | Admitting: Internal Medicine

## 2013-05-15 ENCOUNTER — Ambulatory Visit (INDEPENDENT_AMBULATORY_CARE_PROVIDER_SITE_OTHER): Payer: Medicare Other | Admitting: Internal Medicine

## 2013-05-15 VITALS — BP 139/72 | HR 81 | Ht 66.0 in | Wt 142.0 lb

## 2013-05-15 DIAGNOSIS — I498 Other specified cardiac arrhythmias: Secondary | ICD-10-CM

## 2013-05-15 DIAGNOSIS — Z950 Presence of cardiac pacemaker: Secondary | ICD-10-CM

## 2013-05-15 DIAGNOSIS — I4891 Unspecified atrial fibrillation: Secondary | ICD-10-CM

## 2013-05-15 DIAGNOSIS — I10 Essential (primary) hypertension: Secondary | ICD-10-CM

## 2013-05-15 LAB — MDC_IDC_ENUM_SESS_TYPE_INCLINIC
Battery Voltage: 2.8 V
Brady Statistic RA Percent Paced: 27.5 %
Lead Channel Impedance Value: 567 Ohm
Lead Channel Pacing Threshold Amplitude: 0.5 V
Lead Channel Pacing Threshold Pulse Width: 0.4 ms
Lead Channel Setting Pacing Amplitude: 3.5 V
Lead Channel Setting Pacing Amplitude: 3.5 V
Lead Channel Setting Pacing Pulse Width: 0.4 ms
Lead Channel Setting Sensing Sensitivity: 5.6 mV

## 2013-05-15 NOTE — Assessment & Plan Note (Signed)
She appears to be maintaining NSR. She will continue her current meds. 

## 2013-05-15 NOTE — Assessment & Plan Note (Signed)
Her blood pressure is well controlled. Continue current meds. 

## 2013-05-15 NOTE — Patient Instructions (Addendum)
Your physician recommends that you schedule a follow-up appointment in: in September with Dr Ladona Ridgel and care link transmission on 08-16-13 You will receive a reminder letter two months in advance reminding you to call and schedule your appointment. If you don't receive this letter, please contact our office.

## 2013-05-15 NOTE — Assessment & Plan Note (Signed)
Her [employer] DDD PM is working normally. Will recheck in several months. 

## 2013-05-15 NOTE — Progress Notes (Signed)
HPI Sierra Good returns today for followup. She is a pleasant 79 yo woman with a h/o symptomatic bradycardia, PAF with a RVR, s/p PPM. Her post op course was complicated by pain over the right arm and she subsequently developed a locked up shoulder which required rehab. She has improved. She is still moving slowly with a walker but appears to be gradually improving. She denies chest pain or sob.  Allergies  Allergen Reactions  . Statins Other (See Comments)    Causes leg cramps and muscle weakness     Current Outpatient Prescriptions  Medication Sig Dispense Refill  . acetaminophen (TYLENOL) 325 MG tablet Take 2 tablets (650 mg total) by mouth every 6 (six) hours as needed.      Marland Kitchen amoxicillin-clavulanate (AUGMENTIN) 500-125 MG per tablet Take 1 tablet (500 mg total) by mouth 2 (two) times daily.  6 tablet  0  . Calcium-Vitamin D (CALTRATE 600 PLUS-VIT D PO) Take 1 tablet by mouth daily.      . clobetasol cream (TEMOVATE) 0.05 % Apply 1 application topically 2 (two) times daily.       Marland Kitchen dexlansoprazole (DEXILANT) 60 MG capsule Take 60 mg by mouth daily.      Marland Kitchen diltiazem (CARDIZEM CD) 120 MG 24 hr capsule Take 1 capsule (120 mg total) by mouth daily.  60 capsule  0  . lactose free nutrition (BOOST PLUS) LIQD Take 237 mLs by mouth 3 (three) times daily with meals.  30 Can  0  . Multiple Vitamins-Minerals (CENTRUM SILVER ULTRA WOMENS) TABS Take 1 tablet by mouth daily.        . ondansetron (ZOFRAN) 4 MG tablet Take 1 tablet (4 mg total) by mouth every 6 (six) hours as needed for nausea.  20 tablet  0  . warfarin (COUMADIN) 4 MG tablet Take 1 tablet (4 mg total) by mouth daily. Patient takes 2mg  on Mon.,Wed.,Fri.,and 4mg  all other days.  30 tablet  0   No current facility-administered medications for this visit.   Facility-Administered Medications Ordered in Other Visits  Medication Dose Route Frequency Provider Last Rate Last Dose  . heparin lock flush 100 unit/mL  500 Units  Intravenous Once Ellouise Newer, PA-C      . sodium chloride 0.9 % injection 10 mL  10 mL Intravenous PRN Ellouise Newer, PA-C         Past Medical History  Diagnosis Date  . GERD (gastroesophageal reflux disease)        . Hyperlipidemia   . Hypertension   . Osteoarthritis of lumbar spine   . PONV (postoperative nausea and vomiting) 2009    after total hip  . Deviated nasal septum     pt had septoplasty  . Nasal turbinate hypertrophy     bilateral  . Cancer     nasal cancer  . Cancer     cancer in the nose and in lower jaw  . Atrial fibrillation   . Dizziness   . Itching   . Diffuse large B cell lymphoma     Dr. Mariel Sleet -Nasal cavity, right sided lymph adenopathy  . OAB (overactive bladder)   . Tubulovillous adenoma of colon 10/2008    w/ HG dysplasia    ROS:   All systems reviewed and negative except as noted in the HPI.   Past Surgical History  Procedure Laterality Date  . Total hip arthroplasty  2009    Right, [facility] (general)  . Bunionectomy  20+yrs ago    bilateral, [facility]  . Hammer toe surgery      20+ yrs ago  . Cataract extraction w/ intraocular lens  implant, bilateral  2010    [facility]  . Nasal septoplasty w/ turbinoplasty  12/18/2010    Procedure: NASAL SEPTOPLASTY WITH TURBINATE REDUCTION;  Surgeon: Darletta Moll;  Location: [facility];  Service: ENT;  Laterality: Bilateral;  . Colon surgery  10/29/2008    Ingram-right hemicolectomy for tubulovillous adenoma (3cm) with HG glandular dysplasia  . Portacath placement  02/10/2011    Procedure: INSERTION PORT-A-CATH;  Surgeon: Marlane Hatcher;  Location: [facility];  Service: General;  Laterality: Left;  left subclavian  . Skin cancer removal  6/13  . Colonoscopy  09/28/2002    RMR: Normal colonic mucosa.  Redundant and elongated but otherwise normal appearing colon/normal rectum  . Colonoscopy  09/11/2008    RMR: Normal rectum/Diminutive sigmoid polyp status post snare removal/  Flat sessile semilunar lesion at and in  the ileocecal valve status post hot snare piecemeal debulking as described above  . Colonoscopy  01/06/10    Rourk-normal rectum, normal residual colon status post right hemicolectomy. Next colonoscopy in 01/2015 health permiting  . Esophagogastroduodenoscopy (egd) with esophageal dilation  05/23/2012    WUJ:[id number] ring-dilated as described above. Hiatal hernia  . Esophagogastroduodenoscopy Left 03/06/2013    Procedure: ESOPHAGOGASTRODUODENOSCOPY (EGD);  Surgeon: Beverley Fiedler, MD;  Location: [facility] ENDOSCOPY;  Service: Gastroenterology;  Laterality: Left;     Family History  Problem Relation Age of Onset  . Pseudochol deficiency Neg Hx   . Malignant hyperthermia Neg Hx   . Hypotension Neg Hx   . Anesthesia problems Neg Hx   . Cancer Mother   . Stroke Father      History   Social History  . Marital Status: Widowed    Spouse Name: N/A    Number of Children: N/A  . Years of Education: N/A   Occupational History  . Not on file.   Social History Main Topics  . Smoking status: Never Smoker   . Smokeless tobacco: Never Used  . Alcohol Use: No  . Drug Use: No  . Sexual Activity: Yes    Birth Control/ Protection: Post-menopausal   Other Topics Concern  . Not on file   Social History Narrative  . No narrative on file     BP 139/72  Pulse 81  Ht 5\' 6"  (1.676 m)  Wt 142 lb (64.411 kg)  BMI 22.93 kg/m2  Physical Exam:  Well appearing 79 yo woman, NAD HEENT: Unremarkable Neck:  No JVD, no thyromegally Back:  No CVA tenderness Lungs:  Clear with no wheezes, rales or rhonchi; well healed PPM incision. HEART:  Regular rate rhythm, no murmurs, no rubs, no clicks Abd:  soft, positive bowel sounds, no organomegally, no rebound, no guarding Ext:  2 plus pulses, no edema, no cyanosis, no clubbing Skin:  No rashes no nodules Neuro:  CN II through XII intact, motor grossly intact  DEVICE  Normal device function.  See PaceArt for details.   Assess/Plan:

## 2013-05-16 ENCOUNTER — Encounter: Payer: Self-pay | Admitting: Internal Medicine

## 2013-05-18 ENCOUNTER — Encounter (HOSPITAL_COMMUNITY): Payer: Medicare Other | Attending: Hematology and Oncology

## 2013-05-18 ENCOUNTER — Encounter (HOSPITAL_COMMUNITY): Payer: Self-pay

## 2013-05-18 VITALS — BP 164/93 | HR 73 | Temp 97.6°F | Resp 18 | Wt 142.5 lb

## 2013-05-18 DIAGNOSIS — C8591 Non-Hodgkin lymphoma, unspecified, lymph nodes of head, face, and neck: Secondary | ICD-10-CM

## 2013-05-18 DIAGNOSIS — C8581 Other specified types of non-Hodgkin lymphoma, lymph nodes of head, face, and neck: Secondary | ICD-10-CM | POA: Insufficient documentation

## 2013-05-18 DIAGNOSIS — Z87898 Personal history of other specified conditions: Secondary | ICD-10-CM

## 2013-05-18 DIAGNOSIS — Z7901 Long term (current) use of anticoagulants: Secondary | ICD-10-CM

## 2013-05-18 DIAGNOSIS — I4891 Unspecified atrial fibrillation: Secondary | ICD-10-CM

## 2013-05-18 LAB — CBC WITH DIFFERENTIAL/PLATELET
Basophils Absolute: 0 10*3/uL (ref 0.0–0.1)
Basophils Relative: 0 % (ref 0–1)
Eosinophils Relative: 2 % (ref 0–5)
HCT: 42.8 % (ref 36.0–46.0)
Hemoglobin: 14 g/dL (ref 12.0–15.0)
Lymphocytes Relative: 11 % — ABNORMAL LOW (ref 12–46)
MCHC: 32.7 g/dL (ref 30.0–36.0)
MCV: 94.5 fL (ref 78.0–100.0)
Monocytes Absolute: 0.3 10*3/uL (ref 0.1–1.0)
Monocytes Relative: 5 % (ref 3–12)
Platelets: 223 10*3/uL (ref 150–400)
RDW: 14.4 % (ref 11.5–15.5)

## 2013-05-18 LAB — COMPREHENSIVE METABOLIC PANEL
AST: 19 U/L (ref 0–37)
Albumin: 3.6 g/dL (ref 3.5–5.2)
CO2: 25 mEq/L (ref 19–32)
Calcium: 9.6 mg/dL (ref 8.4–10.5)
Chloride: 99 mEq/L (ref 96–112)
Creatinine, Ser: 0.89 mg/dL (ref 0.50–1.10)
GFR calc non Af Amer: 56 mL/min — ABNORMAL LOW (ref >90)

## 2013-05-18 LAB — FERRITIN: Ferritin: 31 ng/mL (ref 10–291)

## 2013-05-18 LAB — RETICULOCYTES
RBC.: 4.53 MIL/uL (ref 3.87–5.11)
Retic Ct Pct: 2.3 % (ref 0.4–3.1)

## 2013-05-18 LAB — LACTATE DEHYDROGENASE: LDH: 178 U/L (ref 94–250)

## 2013-05-18 NOTE — Progress Notes (Signed)
[facility]  OFFICE PROGRESS NOTE  VYAS,DHRUV B., MD [address] [place] [address]  DIAGNOSIS: Lymphoma of lymph nodes of head, face, and/or neck - Plan: CBC with Differential, Reticulocytes, Comprehensive metabolic panel, Lactate dehydrogenase, Beta 2 microglobuline, serum, Ferritin  No chief complaint on file.   CURRENT THERAPY: Watchful expectation.  INTERVAL HISTORY: Sierra Good 79 y.o. female returns for followup of diffuse large B-cell lymphoma stage II-E., status post 4 cycles of CVP-R followed by consolidative radiotherapy with primary disease presenting in the nasal cavity and neck. Last chemotherapy treatment was on 04/13/2011. I do not have a record of the details of her radiotherapy. Since her last visit she developed severe shortness of breath and near syncope and for that reason on 02/16/2013 a pacemaker was inserted. Her life port was removed at the same time. She had an episode of falling afterwards with pneumonia requiring nursing home placement in September. She now lives in assisted living and is improving each day with rehabilitation. She is using a walker but still has rather severe lower extremity weakness. She denies any back pain, PND, orthopnea, palpitations, epistaxis, melena, hematochezia, hematuria, lower extremity swelling or redness, incontinence, skin rash, headache, or seizures. She also denies any fever, night sweats, cough, or wheezing. She has had dry mouth and does rinse frequently during the day.  MEDICAL HISTORY: Past Medical History  Diagnosis Date  . GERD (gastroesophageal reflux disease)        . Hyperlipidemia   . Hypertension   . Osteoarthritis of lumbar spine   . PONV (postoperative nausea and vomiting) 2009    after total hip  . Deviated nasal septum     pt had septoplasty  . Nasal turbinate hypertrophy     bilateral  . Cancer     nasal cancer  . Cancer     cancer in the nose and in  lower jaw  . Atrial fibrillation   . Dizziness   . Itching   . Diffuse large B cell lymphoma     Dr. Mariel Sleet -Nasal cavity, right sided lymph adenopathy  . OAB (overactive bladder)   . Tubulovillous adenoma of colon 10/2008    w/ HG dysplasia    INTERIM HISTORY: has HYPERLIPIDEMIA; HYPERTENSION; Atrial fibrillation; BRADYCARDIA; SYNCOPE, HX OF; Lymphoma of lymph nodes of head, face, and/or neck; Nausea alone; SOB (shortness of breath) on exertion; Dysphagia; GERD (gastroesophageal reflux disease); Long term (current) use of anticoagulants; Muscle weakness (generalized); Difficulty in walking(719.7); and Pacemaker on her problem list.   stage I AE versus stage II, diffuse large B-cell lymphoma, predominantly in the nasal cavity the right side with one lymph node on the right also apparently involved with this CD20 positive lymphoma treated with 4 cycles of R.-CVP using modified doses for cycles 2 and 3 because of neutropenia, severe weakness, fatigue, atrial fibrillation all required hospitalization after cycle 1. Last chemotherapy given on 04/13/2011 representing cycle #4.  After chemotherapy she received radiation therapy by Dr. Roselind Messier with resultant CR.    ALLERGIES:  is allergic to statins.  MEDICATIONS: has a current medication list which includes the following prescription(s): acetaminophen, amoxicillin-clavulanate, calcium-vitamin d, clobetasol cream, dexlansoprazole, diltiazem, lactose free nutrition, centrum silver ultra womens, ondansetron, and warfarin, and the following Facility-Administered Medications: heparin lock flush and sodium chloride.  SURGICAL HISTORY:  Past Surgical History  Procedure Laterality Date  . Total hip arthroplasty  2009    Right, [facility] (general)  .  Bunionectomy  20+yrs ago    bilateral, [facility]  . Hammer toe surgery      20+ yrs ago  . Cataract extraction w/ intraocular lens  implant, bilateral  2010    [facility]  . Nasal septoplasty w/ turbinoplasty  12/18/2010     Procedure: NASAL SEPTOPLASTY WITH TURBINATE REDUCTION;  Surgeon: Darletta Moll;  Location: [facility];  Service: ENT;  Laterality: Bilateral;  . Colon surgery  10/29/2008    Ingram-right hemicolectomy for tubulovillous adenoma (3cm) with HG glandular dysplasia  . Portacath placement  02/10/2011    Procedure: INSERTION PORT-A-CATH;  Surgeon: Marlane Hatcher;  Location: [facility];  Service: General;  Laterality: Left;  left subclavian  . Skin cancer removal  6/13  . Colonoscopy  09/28/2002    RMR: Normal colonic mucosa.  Redundant and elongated but otherwise normal appearing colon/normal rectum  . Colonoscopy  09/11/2008    RMR: Normal rectum/Diminutive sigmoid polyp status post snare removal/  Flat sessile semilunar lesion at and in the ileocecal valve status post hot snare piecemeal debulking as described above  . Colonoscopy  01/06/10    Rourk-normal rectum, normal residual colon status post right hemicolectomy. Next colonoscopy in 01/2015 health permiting  . Esophagogastroduodenoscopy (egd) with esophageal dilation  05/23/2012    UJW:JXBJYNWG'N ring-dilated as described above. Hiatal hernia  . Esophagogastroduodenoscopy Left 03/06/2013    Procedure: ESOPHAGOGASTRODUODENOSCOPY (EGD);  Surgeon: Beverley Fiedler, MD;  Location: [facility] ENDOSCOPY;  Service: Gastroenterology;  Laterality: Left;    FAMILY HISTORY: family history includes Cancer in her mother; Stroke in her father. There is no history of Pseudochol deficiency, Malignant hyperthermia, Hypotension, or Anesthesia problems.  SOCIAL HISTORY:  reports that she has never smoked. She has never used smokeless tobacco. She reports that she does not drink alcohol or use illicit drugs.  REVIEW OF SYSTEMS:  Other than that discussed above is noncontributory.  PHYSICAL EXAMINATION: ECOG PERFORMANCE STATUS: 2 - Symptomatic, <50% confined to bed  There were no vitals taken for this visit.  GENERAL:alert, no distress and comfortable SKIN: skin color, texture,  turgor are normal, no rashes or significant lesions EYES: PERLA; Conjunctiva are pink and non-injected, sclera clear OROPHARYNX:no exudate, no erythema on lips, buccal mucosa, or tongue. No significant xerostomia. NECK: supple, thyroid normal size, non-tender, without nodularity. No masses CHEST: Normal AP diameter with previous port site well-healed. Left-sided pacemaker in place. No breast masses. LYMPH:  no palpable lymphadenopathy in the cervical, axillary or inguinal LUNGS: clear to auscultation and percussion with normal breathing effort HEART: regular rate & rhythm and no murmurs. ABDOMEN:abdomen soft, non-tender and normal bowel sounds MUSCULOSKELETAL:no cyanosis of digits and no clubbing. Range of motion normal.  NEURO: alert & oriented x 3 with fluent speech, no focal motor/sensory deficits   LABORATORY DATA: Office Visit on 05/15/2013  Component Date Value Range Status  . Pulse Generator Manufacturer 05/15/2013 Medtronic   Final  . Pulse Gen Model 05/15/2013 SEDR01 Sensia   Final  . Pulse Gen Serial Number 05/15/2013 [id number] H   Final  . RV Sense Sensitivity 05/15/2013 5.6   Final  . RA Pace Amplitude 05/15/2013 3.5   Final  . RV Pace PulseWidth 05/15/2013 0.4   Final  . RV Pace Amplitude 05/15/2013 3.5   Final  . RA Impedance 05/15/2013 713   Final  . RA Impedance 05/15/2013 567   Final  . RA Amplitude 05/15/2013 0.5   Final  . RV Amplitude 05/15/2013 15.68   Final  . RV Pacing  Amplitude 05/15/2013 0.5   Final  . RV Pacing PulseWidth 05/15/2013 0.4   Final  . Battery Longevity 05/15/2013 121   Final  . Battery Voltage 05/15/2013 2.80   Final  . Huston Foley RA Perc Paced 05/15/2013 27.5   Final  . Huston Foley RV Perc Paced 05/15/2013 1.5   Final  . Eval Rhythm 05/15/2013 A-fib   Final  . Miscellaneous Comment 05/15/2013    Final                   Value:Pacemaker check in clinic. Normal device function. Thresholds, sensing, impedances consistent with previous measurements. Device  programmed to maximize longevity. 33 mode switches 93.6%, + coumadin.  No high ventricular rates noted. Device programmed at                          appropriate safety margins. Histogram distribution appropriate for patient activity level. Device programmed to optimize intrinsic conduction. Estimated longevity 11 years. Patient enrolled in remote follow-up/TTM's with [employer]. Plan to follow every 3                          months remotely and see annually in office. Patient education completed.  [facility] 08/16/13.    PATHOLOGY: No new pathology.  Urinalysis    Component Value Date/Time   COLORURINE YELLOW 03/05/2013 1037   APPEARANCEUR CLEAR 03/05/2013 1037   LABSPEC >1.030* 03/05/2013 1037   PHURINE 5.5 03/05/2013 1037   GLUCOSEU NEGATIVE 03/05/2013 1037   HGBUR NEGATIVE 03/05/2013 1037   BILIRUBINUR NEGATIVE 03/05/2013 1037   KETONESUR NEGATIVE 03/05/2013 1037   PROTEINUR TRACE* 03/05/2013 1037   UROBILINOGEN 0.2 03/05/2013 1037   NITRITE NEGATIVE 03/05/2013 1037   LEUKOCYTESUR NEGATIVE 03/05/2013 1037    RADIOGRAPHIC STUDIES:  Skull Base To Thigh 09/17/2011 Status: Final result         PACS Images    Show images for NM PET Image Restag (PS) Skull Base To Thigh         Study Result    *RADIOLOGY REPORT*  Clinical Data: Subsequent treatment strategy for nasal lymphoma.  Chemotherapy 11/12.  NUCLEAR MEDICINE PET CT SKULL BASE TO THIGH  Technique: 17.2 mCi F-18 FDG was injected intravenously via the  right AC. Full-ring PET imaging was performed from the skull base  through the mid-thighs 70 minutes after injection. CT data was  obtained and used for attenuation correction and anatomic  localization only. (This was not acquired as a diagnostic CT  examination.)  Fasting Blood Glucose: 104  Patient Weight: 150 pounds.  Comparison: 04/27/2011  Findings: PET images demonstrate no residual or recurrent  hypermetabolism within the nasal cavity or cervical lymph nodes.    Hypermetabolic/brown fat is identified within the paraspinous  regions of the upper chest.  The questioned right mediastinal hypermetabolism on the prior exam  is not identified.  A focus of hypermetabolism is identified about the anterior most  aspect of the left fifth rib. There is suggestion of concurrent  subtle osseous irregularity in this area on image 108. This  measures a S.U.V. max of 3.7.  Mild left adrenal hypermetabolism, without well-defined nodule.  This is nonspecific. It measures a S.U.V. max of 4.1  No abnormal activity within the pelvis.  CT images performed for attenuation correction demonstrate similar  tiny right thyroid nodule with calcification. 9 mm.  Left-sided Port-A-Cath which terminates at the mid to low SVC.  Cardiomegaly  with coronary artery atherosclerosis. Moderate hiatal  hernia.  Biapical pleural parenchymal scarring. Right renal collecting  systems stone. Caudate lobe hepatic cyst, unchanged. Pelvic floor  laxity. No abdominal pelvic adenopathy. Right hip arthroplasty  with resultant beam hardening artifact. Mild osteopenia.  IMPRESSION:  1. No residual or recurrent hypermetabolic adenopathy.  2. Focus of hypermetabolism within the anterior left fifth rib,  new. Favored to be related to interval fracture. Recommend  attention on follow-up.  3. Left adrenal hypermetabolism, without CT correlate. Most  likely physiologic. Recommend attention on follow-up.  Original Report Authenticated      ASSESSMENT:  #1.Stage I AE versus stage II, diffuse large B-cell lymphoma, predominantly in the nasal cavity the right side with one lymph node on the right also apparently involved with this CD20 positive lymphoma treated with 4 cycles of R.-CVP using modified doses for cycles 2 and 3 because of neutropenia, severe weakness, fatigue, atrial fibrillation all required hospitalization after cycle 1. Last chemotherapy given on 04/13/2011 representing cycle #4.  After  chemotherapy she received radiation therapy by Dr. Roselind Messier with resultant CR by PET scan performed on 09/17/2011. #2. Atrial fibrillation, status post pacemaker. #3. Deconditioning, currently undergoing rehabilitation at assisted care facility.  PLAN:  #1. Continue rehabilitation. #2. If lab tests are abnormal today additional testing will be performed. #3. Followup in 6 months with lab tests but in the presence of her daughter, she was told to call sooner should she have any new symptoms that are troublesome and persistent.   All questions were answered. The patient knows to call the clinic with any problems, questions or concerns. We can certainly see the patient much sooner if necessary.   I spent 25 minutes counseling the patient face to face. The total time spent in the appointment was 30 minutes.    Maurilio Lovely, MD 05/18/2013 9:08 AM

## 2013-05-18 NOTE — Patient Instructions (Signed)
.  [facility] Discharge Instructions  RECOMMENDATIONS MADE BY THE CONSULTANT AND ANY TEST RESULTS WILL BE SENT TO YOUR REFERRING PHYSICIAN.  EXAM FINDINGS BY THE PHYSICIAN TODAY AND SIGNS OR SYMPTOMS TO REPORT TO CLINIC OR PRIMARY PHYSICIAN: Exam and findings as discussed by Dr. Zigmund Daniel.  Report any new lumps or night sweats INSTRUCTIONS/FOLLOW-UP: Labs today and see [place] back in 6 months  Thank you for choosing [facility] to provide your oncology and hematology care.  To afford each patient quality time with our providers, please arrive at least 15 minutes before your scheduled appointment time.  With your help, our goal is to use those 15 minutes to complete the necessary work-up to ensure our physicians have the information they need to help with your evaluation and healthcare recommendations.    Effective January 1st, 2014, we ask that you re-schedule your appointment with our physicians should you arrive 10 or more minutes late for your appointment.  We strive to give you quality time with our providers, and arriving late affects you and other patients whose appointments are after yours.    Again, thank you for choosing [facility].  Our hope is that these requests will decrease the amount of time that you wait before being seen by our physicians.       _____________________________________________________________  Should you have questions after your visit to [facility], please contact our office at [phone] between the hours of 8:30 a.m. and 5:00 p.m.  Voicemails left after 4:30 p.m. will not be returned until the following business day.  For prescription refill requests, have your pharmacy contact our office with your prescription refill request.

## 2013-05-18 NOTE — Addendum Note (Signed)
Addended byLeida Lauth on: 05/18/2013 09:47 AM   Modules accepted: Orders

## 2013-05-30 ENCOUNTER — Telehealth (HOSPITAL_COMMUNITY): Payer: Self-pay | Admitting: *Deleted

## 2013-05-30 NOTE — Telephone Encounter (Signed)
Left message with daughter re: lab result note below.

## 2013-05-30 NOTE — Telephone Encounter (Signed)
Patient's  Daughter would like call re: her lab work that was done 2 weeks ago. Her main question is if it proves that lymphoma is still in remission.

## 2013-05-30 NOTE — Telephone Encounter (Signed)
Message copied by Adelene Amas on Tue May 30, 2013  2:02 PM ------      Message from: Alla German A      Created: Tue May 30, 2013 12:02 PM       According to lab results, there is no demonstrable evidence of lymphoma being present. Please call daughter with this interpretation.  ------

## 2013-06-09 NOTE — Progress Notes (Signed)
REVIEWED.  

## 2013-06-29 ENCOUNTER — Ambulatory Visit (INDEPENDENT_AMBULATORY_CARE_PROVIDER_SITE_OTHER): Payer: Medicare Other | Admitting: Otolaryngology

## 2013-06-29 DIAGNOSIS — C8581 Other specified types of non-Hodgkin lymphoma, lymph nodes of head, face, and neck: Secondary | ICD-10-CM

## 2013-08-16 ENCOUNTER — Encounter: Payer: Self-pay | Admitting: Internal Medicine

## 2013-08-16 ENCOUNTER — Ambulatory Visit (INDEPENDENT_AMBULATORY_CARE_PROVIDER_SITE_OTHER): Payer: Medicare Other | Admitting: *Deleted

## 2013-08-16 DIAGNOSIS — Z950 Presence of cardiac pacemaker: Secondary | ICD-10-CM

## 2013-08-16 DIAGNOSIS — I498 Other specified cardiac arrhythmias: Secondary | ICD-10-CM

## 2013-08-16 LAB — MDC_IDC_ENUM_SESS_TYPE_REMOTE
Battery Impedance: 100 Ohm
Battery Voltage: 2.8 V
Brady Statistic AP VP Percent: 1 %
Brady Statistic AP VS Percent: 1 %
Brady Statistic AS VP Percent: 0 %
Brady Statistic AS VS Percent: 98 %
Date Time Interrogation Session: 20150311120158
Lead Channel Impedance Value: 754 Ohm
Lead Channel Pacing Threshold Pulse Width: 0.4 ms
Lead Channel Pacing Threshold Pulse Width: 0.4 ms
Lead Channel Setting Pacing Amplitude: 2.5 V
Lead Channel Setting Sensing Sensitivity: 5.6 mV
MDC IDC MSMT BATTERY REMAINING LONGEVITY: 126 mo
MDC IDC MSMT LEADCHNL RA PACING THRESHOLD AMPLITUDE: 0.375 V
MDC IDC MSMT LEADCHNL RA SENSING INTR AMPL: 1 mV
MDC IDC MSMT LEADCHNL RV IMPEDANCE VALUE: 510 Ohm
MDC IDC MSMT LEADCHNL RV PACING THRESHOLD AMPLITUDE: 0.5 V
MDC IDC MSMT LEADCHNL RV SENSING INTR AMPL: 11.2 mV
MDC IDC SET LEADCHNL RA PACING AMPLITUDE: 3.5 V
MDC IDC SET LEADCHNL RV PACING PULSEWIDTH: 0.4 ms

## 2013-08-24 ENCOUNTER — Encounter: Payer: Self-pay | Admitting: *Deleted

## 2013-08-28 ENCOUNTER — Encounter: Payer: Self-pay | Admitting: *Deleted

## 2013-11-14 ENCOUNTER — Telehealth: Payer: Self-pay | Admitting: *Deleted

## 2013-11-14 NOTE — Telephone Encounter (Signed)
Pt daughter stopped by office today because pt is staying very weak and has no stamina. She would like to speak to nurse to see if she needs to make a appointment. She stats she never heard back from transition in April either./tmj

## 2013-11-14 NOTE — Telephone Encounter (Signed)
Per daughter, patient experiencing fatigue and weakness.  Appointment rescheduled for 7/6 with Dr. Lovena Le.

## 2013-11-16 ENCOUNTER — Ambulatory Visit (HOSPITAL_COMMUNITY): Payer: Self-pay

## 2013-11-17 NOTE — Progress Notes (Signed)
This encounter was created in error - please disregard.

## 2013-11-20 ENCOUNTER — Ambulatory Visit (INDEPENDENT_AMBULATORY_CARE_PROVIDER_SITE_OTHER): Payer: Medicare Other | Admitting: *Deleted

## 2013-11-20 ENCOUNTER — Telehealth: Payer: Self-pay | Admitting: Cardiology

## 2013-11-20 ENCOUNTER — Encounter: Payer: Self-pay | Admitting: Internal Medicine

## 2013-11-20 DIAGNOSIS — I4891 Unspecified atrial fibrillation: Secondary | ICD-10-CM

## 2013-11-20 DIAGNOSIS — I498 Other specified cardiac arrhythmias: Secondary | ICD-10-CM

## 2013-11-20 LAB — MDC_IDC_ENUM_SESS_TYPE_REMOTE
Brady Statistic AP VP Percent: 1.2 %
Brady Statistic AP VS Percent: 0.5 %
Brady Statistic AS VS Percent: 98.1 %
Lead Channel Impedance Value: 784 Ohm
Lead Channel Pacing Threshold Amplitude: 0.5 V
Lead Channel Sensing Intrinsic Amplitude: 1 mV
Lead Channel Sensing Intrinsic Amplitude: 11.2 mV
Lead Channel Setting Pacing Amplitude: 3.5 V
MDC IDC MSMT BATTERY REMAINING LONGEVITY: 125 mo
MDC IDC MSMT BATTERY VOLTAGE: 2.8 V
MDC IDC MSMT LEADCHNL RV IMPEDANCE VALUE: 510 Ohm
MDC IDC MSMT LEADCHNL RV PACING THRESHOLD PULSEWIDTH: 0.4 ms
MDC IDC SET LEADCHNL RV PACING AMPLITUDE: 2.5 V
MDC IDC SET LEADCHNL RV PACING PULSEWIDTH: 0.4 ms
MDC IDC SET LEADCHNL RV SENSING SENSITIVITY: 5.6 mV
MDC IDC STAT BRADY AS VP PERCENT: 0.2 %

## 2013-11-20 NOTE — Telephone Encounter (Signed)
LMOVM reminding pt to send remote transmission.   

## 2013-11-21 NOTE — Progress Notes (Signed)
Remote pacemaker transmission.   

## 2013-11-28 ENCOUNTER — Encounter: Payer: Self-pay | Admitting: Cardiology

## 2013-11-29 ENCOUNTER — Encounter: Payer: Self-pay | Admitting: Internal Medicine

## 2013-12-01 ENCOUNTER — Encounter (HOSPITAL_COMMUNITY): Payer: Medicare Other | Attending: Hematology and Oncology

## 2013-12-01 ENCOUNTER — Encounter (HOSPITAL_COMMUNITY): Payer: Medicare Other

## 2013-12-01 ENCOUNTER — Encounter (HOSPITAL_COMMUNITY): Payer: Self-pay

## 2013-12-01 VITALS — BP 138/92 | HR 86 | Temp 97.6°F | Resp 16 | Wt 145.7 lb

## 2013-12-01 DIAGNOSIS — Z9221 Personal history of antineoplastic chemotherapy: Secondary | ICD-10-CM | POA: Insufficient documentation

## 2013-12-01 DIAGNOSIS — R5381 Other malaise: Secondary | ICD-10-CM

## 2013-12-01 DIAGNOSIS — C8581 Other specified types of non-Hodgkin lymphoma, lymph nodes of head, face, and neck: Secondary | ICD-10-CM | POA: Insufficient documentation

## 2013-12-01 DIAGNOSIS — C8591 Non-Hodgkin lymphoma, unspecified, lymph nodes of head, face, and neck: Secondary | ICD-10-CM

## 2013-12-01 DIAGNOSIS — I4891 Unspecified atrial fibrillation: Secondary | ICD-10-CM

## 2013-12-01 DIAGNOSIS — R5383 Other fatigue: Secondary | ICD-10-CM

## 2013-12-01 DIAGNOSIS — Z923 Personal history of irradiation: Secondary | ICD-10-CM | POA: Insufficient documentation

## 2013-12-01 DIAGNOSIS — Z950 Presence of cardiac pacemaker: Secondary | ICD-10-CM

## 2013-12-01 DIAGNOSIS — R29898 Other symptoms and signs involving the musculoskeletal system: Secondary | ICD-10-CM | POA: Insufficient documentation

## 2013-12-01 LAB — COMPREHENSIVE METABOLIC PANEL
ALT: 15 U/L (ref 0–35)
AST: 21 U/L (ref 0–37)
Albumin: 3.8 g/dL (ref 3.5–5.2)
Alkaline Phosphatase: 107 U/L (ref 39–117)
BUN: 24 mg/dL — ABNORMAL HIGH (ref 6–23)
CO2: 23 mEq/L (ref 19–32)
Calcium: 10 mg/dL (ref 8.4–10.5)
Chloride: 101 mEq/L (ref 96–112)
Creatinine, Ser: 0.83 mg/dL (ref 0.50–1.10)
GFR calc Af Amer: 70 mL/min — ABNORMAL LOW (ref >90)
GFR calc non Af Amer: 61 mL/min — ABNORMAL LOW (ref >90)
Glucose, Bld: 106 mg/dL — ABNORMAL HIGH (ref 70–99)
Potassium: 4.3 mEq/L (ref 3.7–5.3)
Sodium: 141 mEq/L (ref 137–147)
Total Bilirubin: 0.2 mg/dL — ABNORMAL LOW (ref 0.3–1.2)
Total Protein: 7.3 g/dL (ref 6.0–8.3)

## 2013-12-01 LAB — CBC WITH DIFFERENTIAL/PLATELET
BASOS ABS: 0 10*3/uL (ref 0.0–0.1)
Basophils Relative: 0 % (ref 0–1)
Eosinophils Absolute: 0.2 10*3/uL (ref 0.0–0.7)
Eosinophils Relative: 2 % (ref 0–5)
HCT: 45.8 % (ref 36.0–46.0)
Hemoglobin: 15.6 g/dL — ABNORMAL HIGH (ref 12.0–15.0)
LYMPHS ABS: 1.3 10*3/uL (ref 0.7–4.0)
Lymphocytes Relative: 18 % (ref 12–46)
MCH: 32.3 pg (ref 26.0–34.0)
MCHC: 34.1 g/dL (ref 30.0–36.0)
MCV: 94.8 fL (ref 78.0–100.0)
Monocytes Absolute: 0.5 10*3/uL (ref 0.1–1.0)
Monocytes Relative: 7 % (ref 3–12)
NEUTROS ABS: 5.2 10*3/uL (ref 1.7–7.7)
NEUTROS PCT: 73 % (ref 43–77)
Platelets: 231 10*3/uL (ref 150–400)
RBC: 4.83 MIL/uL (ref 3.87–5.11)
RDW: 14.6 % (ref 11.5–15.5)
WBC: 7.1 10*3/uL (ref 4.0–10.5)

## 2013-12-01 LAB — LACTATE DEHYDROGENASE: LDH: 205 U/L (ref 94–250)

## 2013-12-01 NOTE — Patient Instructions (Signed)
[facility] Discharge Instructions  RECOMMENDATIONS MADE BY THE CONSULTANT AND ANY TEST RESULTS WILL BE SENT TO YOUR REFERRING PHYSICIAN.  EXAM FINDINGS BY THE PHYSICIAN TODAY AND SIGNS OR SYMPTOMS TO REPORT TO CLINIC OR PRIMARY PHYSICIAN: Exam and findings as discussed by Dr. Barnet Glasgow.  Report night sweats, any new lumps, unexplained weight loss, etc.  MEDICATIONS PRESCRIBED:  none  INSTRUCTIONS/FOLLOW-UP: Follow-up with labs and office visit in 6 months.  Thank you for choosing [facility] to provide your oncology and hematology care.  To afford each patient quality time with our providers, please arrive at least 15 minutes before your scheduled appointment time.  With your help, our goal is to use those 15 minutes to complete the necessary work-up to ensure our physicians have the information they need to help with your evaluation and healthcare recommendations.    Effective January 1st, 2014, we ask that you re-schedule your appointment with our physicians should you arrive 10 or more minutes late for your appointment.  We strive to give you quality time with our providers, and arriving late affects you and other patients whose appointments are after yours.    Again, thank you for choosing [facility].  Our hope is that these requests will decrease the amount of time that you wait before being seen by our physicians.       _____________________________________________________________  Should you have questions after your visit to [facility], please contact our office at [phone] between the hours of 8:30 a.m. and 4:30 p.m.  Voicemails left after 4:30 p.m. will not be returned until the following business day.  For prescription refill requests, have your pharmacy contact our office with your prescription refill request.    _______________________________________________________________  We hope that we have given you very  good care.  You may receive a patient satisfaction survey in the mail, please complete it and return it as soon as possible.  We value your feedback!

## 2013-12-01 NOTE — Progress Notes (Signed)
[facility]  OFFICE PROGRESS NOTE  VYAS,DHRUV B., MD [place] [address]  DIAGNOSIS: Lymphoma of lymph nodes of head, face, and/or neck - Plan: CBC with Differential, Comprehensive metabolic panel, Lactate dehydrogenase, Beta 2 microglobulin, serum, CBC with Differential, Comprehensive metabolic panel, Lactate dehydrogenase, Beta 2 microglobulin, serum  Chief Complaint  Patient presents with  . Lymphoma    CURRENT THERAPY: Watchful expectation  INTERVAL HISTORY: Sierra Good 80 y.o. female returns for followup of diffuse large B-cell lymphoma, stage II-he, status post 4 cycles of CVP-R. followed by consolidative radiotherapy with primary disease presenting in the nasal cavity and neck. Last chemotherapy treatment was 04/13/2011. She had a pacemaker inserted on 02/16/2013 because of shortness of breath and near syncope at which time her life port was also removed.  Lower extremity weakness has worsened since her last visit. She has had several falls. She denies any headache or peripheral paresthesias. She denies any back pain. Appetite has been good with minimal easy satiety. She denies any fever, night sweats, incontinence, diarrhea, constipation, melena, hematochezia, hematuria, skin rash, headache, or seizures. She does have a chronic dry mouth and has water at her bedside.  MEDICAL HISTORY: Past Medical History  Diagnosis Date  . GERD (gastroesophageal reflux disease)        . Hyperlipidemia   . Hypertension   . Osteoarthritis of lumbar spine   . PONV (postoperative nausea and vomiting) 2009    after total hip  . Deviated nasal septum     pt had septoplasty  . Nasal turbinate hypertrophy     bilateral  . Cancer     nasal cancer  . Cancer     cancer in the nose and in lower jaw  . Atrial fibrillation   . Dizziness   . Itching   . Diffuse large B cell lymphoma     Dr. Tressie Stalker -Nasal cavity, right sided lymph  adenopathy  . OAB (overactive bladder)   . Tubulovillous adenoma of colon 10/2008    w/ HG dysplasia    INTERIM HISTORY: has HYPERLIPIDEMIA; HYPERTENSION; Atrial fibrillation; BRADYCARDIA; SYNCOPE, HX OF; Lymphoma of lymph nodes of head, face, and/or neck; Nausea alone; SOB (shortness of breath) on exertion; Dysphagia; GERD (gastroesophageal reflux disease); Long term (current) use of anticoagulants; Muscle weakness (generalized); Difficulty in walking(719.7); and Pacemaker on her problem list.    ALLERGIES:  is allergic to statins.  MEDICATIONS: has a current medication list which includes the following prescription(s): acetaminophen, calcium-vitamin d, vitamin d3, clobetasol cream, dexlansoprazole, diltiazem, guaifenesin, guaifenesin, lactose free nutrition, loratadine, centrum silver ultra womens, ondansetron, sertraline, trazodone, and warfarin, and the following Facility-Administered Medications: heparin lock flush and sodium chloride.  SURGICAL HISTORY:  Past Surgical History  Procedure Laterality Date  . Total hip arthroplasty  2009    Right, [facility] (general)  . Bunionectomy  20+yrs ago    bilateral, [facility]  . Hammer toe surgery      20+ yrs ago  . Cataract extraction w/ intraocular lens  implant, bilateral  2010    [facility]  . Nasal septoplasty w/ turbinoplasty  12/18/2010    Procedure: NASAL SEPTOPLASTY WITH TURBINATE REDUCTION;  Surgeon: Ascencion Dike;  Location: [facility];  Service: ENT;  Laterality: Bilateral;  . Colon surgery  10/29/2008    Ingram-right hemicolectomy for tubulovillous adenoma (3cm) with HG glandular dysplasia  . Portacath placement  02/10/2011    Procedure: INSERTION PORT-A-CATH;  Surgeon:  Scherry Ran;  Location: [facility];  Service: General;  Laterality: Left;  left subclavian  . Skin cancer removal  6/13  . Colonoscopy  09/28/2002    RMR: Normal colonic mucosa.  Redundant and elongated but otherwise normal appearing colon/normal rectum  . Colonoscopy  09/11/2008     RMR: Normal rectum/Diminutive sigmoid polyp status post snare removal/  Flat sessile semilunar lesion at and in the ileocecal valve status post hot snare piecemeal debulking as described above  . Colonoscopy  01/06/10    Rourk-normal rectum, normal residual colon status post right hemicolectomy. Next colonoscopy in 01/2015 health permiting  . Esophagogastroduodenoscopy (egd) with esophageal dilation  05/23/2012    IDP:OEUMPNTI'R ring-dilated as described above. Hiatal hernia  . Esophagogastroduodenoscopy Left 03/06/2013    Procedure: ESOPHAGOGASTRODUODENOSCOPY (EGD);  Surgeon: Jerene Bears, MD;  Location: [facility];  Service: Gastroenterology;  Laterality: Left;  . Pacemaker insertion Right 02/16/2013  . Port-a-cath removal Left 02/16/2013    FAMILY HISTORY: family history includes Cancer in her mother; Stroke in her father. There is no history of Pseudochol deficiency, Malignant hyperthermia, Hypotension, or Anesthesia problems.  SOCIAL HISTORY:  reports that she has never smoked. She has never used smokeless tobacco. She reports that she does not drink alcohol or use illicit drugs.  REVIEW OF SYSTEMS:  Other than that discussed above is noncontributory.  PHYSICAL EXAMINATION: ECOG PERFORMANCE STATUS: 2 - Symptomatic, <50% confined to bed  Blood pressure 138/92, pulse 86, temperature 97.6 F (36.4 C), temperature source Oral, resp. rate 16, weight 145 lb 11.2 oz (66.089 kg).  GENERAL:alert, no distress and comfortable SKIN: skin color, texture, turgor are normal, no rashes or significant lesions EYES: PERLA; Conjunctiva are pink and non-injected, sclera clear SINUSES: No redness or tenderness over maxillary or ethmoid sinuses OROPHARYNX:no exudate, no erythema on lips, buccal mucosa, or tongue. NECK: supple, thyroid normal size, non-tender, without nodularity. No masses CHEST: Normal AP diameter with no breast masses. Pacemaker in place. LYMPH:  no palpable lymphadenopathy in the  cervical, axillary or inguinal LUNGS: clear to auscultation and percussion with normal breathing effort HEART: Irregularly irregular with no S3.. ABDOMEN:abdomen soft, non-tender and normal bowel sounds MUSCULOSKELETAL:no cyanosis of digits and no clubbing. Range of motion normal.  NEURO: alert & oriented x 3 with fluent speech, no focal motor/sensory deficits. Bilateral lower extremity weakness right greater than left with no evidence of clonus or hyperreflexia.   LABORATORY DATA: Lab on 12/01/2013  Component Date Value Ref Range Status  . WBC 12/01/2013 7.1  4.0 - 10.5 K/uL Final  . RBC 12/01/2013 4.83  3.87 - 5.11 MIL/uL Final  . Hemoglobin 12/01/2013 15.6* 12.0 - 15.0 g/dL Final  . HCT 12/01/2013 45.8  36.0 - 46.0 % Final  . MCV 12/01/2013 94.8  78.0 - 100.0 fL Final  . MCH 12/01/2013 32.3  26.0 - 34.0 pg Final  . MCHC 12/01/2013 34.1  30.0 - 36.0 g/dL Final  . RDW 12/01/2013 14.6  11.5 - 15.5 % Final  . Platelets 12/01/2013 231  150 - 400 K/uL Final  . Neutrophils Relative % 12/01/2013 73  43 - 77 % Final  . Neutro Abs 12/01/2013 5.2  1.7 - 7.7 K/uL Final  . Lymphocytes Relative 12/01/2013 18  12 - 46 % Final  . Lymphs Abs 12/01/2013 1.3  0.7 - 4.0 K/uL Final  . Monocytes Relative 12/01/2013 7  3 - 12 % Final  . Monocytes Absolute 12/01/2013 0.5  0.1 - 1.0 K/uL Final  .  Eosinophils Relative 12/01/2013 2  0 - 5 % Final  . Eosinophils Absolute 12/01/2013 0.2  0.0 - 0.7 K/uL Final  . Basophils Relative 12/01/2013 0  0 - 1 % Final  . Basophils Absolute 12/01/2013 0.0  0.0 - 0.1 K/uL Final  . Sodium 12/01/2013 141  137 - 147 mEq/L Final  . Potassium 12/01/2013 4.3  3.7 - 5.3 mEq/L Final  . Chloride 12/01/2013 101  96 - 112 mEq/L Final  . CO2 12/01/2013 23  19 - 32 mEq/L Final  . Glucose, Bld 12/01/2013 106* 70 - 99 mg/dL Final  . BUN 12/01/2013 24* 6 - 23 mg/dL Final  . Creatinine, Ser 12/01/2013 0.83  0.50 - 1.10 mg/dL Final  . Calcium 12/01/2013 10.0  8.4 - 10.5 mg/dL Final  .  Total Protein 12/01/2013 7.3  6.0 - 8.3 g/dL Final  . Albumin 12/01/2013 3.8  3.5 - 5.2 g/dL Final  . AST 12/01/2013 21  0 - 37 U/L Final  . ALT 12/01/2013 15  0 - 35 U/L Final  . Alkaline Phosphatase 12/01/2013 107  39 - 117 U/L Final  . Total Bilirubin 12/01/2013 0.2* 0.3 - 1.2 mg/dL Final  . GFR calc non Af Amer 12/01/2013 61* >90 mL/min Final  . GFR calc Af Amer 12/01/2013 70* >90 mL/min Final   Comment: (NOTE)                          The eGFR has been calculated using the CKD EPI equation.                          This calculation has not been validated in all clinical situations.                          eGFR's persistently <90 mL/min signify possible Chronic Kidney                          Disease.  Marland Kitchen LDH 12/01/2013 205  94 - 250 U/L Final  Clinical Support on 11/20/2013  Component Date Value Ref Range Status  . Pulse Generator Manufacturer 11/20/2013 Medtronic   Final  . Pulse Gen Model 11/20/2013 SEDR01 Sensia   Final  . Pulse Gen Serial Number 11/20/2013 [id number] H   Final  . RV Sense Sensitivity 11/20/2013 5.6   Final  . RA Pace Amplitude 11/20/2013 3.5   Final  . RV Pace PulseWidth 11/20/2013 0.4   Final  . RV Pace Amplitude 11/20/2013 2.5   Final  . RA Impedance 11/20/2013 784   Final  . RA Amplitude 11/20/2013 1   Final  . RV IMPEDANCE 11/20/2013 510   Final  . RV Amplitude 11/20/2013 11.2   Final  . RV Pacing Amplitude 11/20/2013 0.5   Final  . RV Pacing PulseWidth 11/20/2013 0.4   Final  . Battery Longevity 11/20/2013 125   Final  . Battery Voltage 11/20/2013 2.80   Final  . Brady AP VP Percent 11/20/2013 1.2   Final  . Loletha Grayer AS VP Percent 11/20/2013 0.2   Final  . Loletha Grayer AP VS Percent 11/20/2013 0.5   Final  . Loletha Grayer AS VS Percent 11/20/2013 98.1   Final  . Eval Rhythm 11/20/2013 A-fib   Final  . Miscellaneous Comment 11/20/2013    Final  Value:Pacemaker remote check. Device function reviewed. Impedance, sensing, auto capture thresholds consistent  with previous measurements. Histograms appropriate for patient and level of activity. All other diagnostic data reviewed and is appropriate and                          stable for patient. Real time/magnet EGM shows appropriate sensing and capture. A-fib, + coumadin.  No ventricular high rate episodes. Estimated longevity 10.5 years. Plan to follow in 3 months remotely, to see in office annually.  ROV 12/11/13$RemoveBe'@2'neDCsquKV$ :45pm with                          Dr. Lovena Le in [place].    PATHOLOGY: No new pathology.  Urinalysis    Component Value Date/Time   COLORURINE YELLOW 03/05/2013 1037   APPEARANCEUR CLEAR 03/05/2013 1037   LABSPEC >1.030* 03/05/2013 1037   PHURINE 5.5 03/05/2013 1037   GLUCOSEU NEGATIVE 03/05/2013 1037   HGBUR NEGATIVE 03/05/2013 1037   BILIRUBINUR NEGATIVE 03/05/2013 1037   KETONESUR NEGATIVE 03/05/2013 1037   PROTEINUR TRACE* 03/05/2013 1037   UROBILINOGEN 0.2 03/05/2013 1037   NITRITE NEGATIVE 03/05/2013 1037   LEUKOCYTESUR NEGATIVE 03/05/2013 1037    RADIOGRAPHIC STUDIES: No results found.  ASSESSMENT:  #1.Stage I AE versus stage II, diffuse large B-cell lymphoma, predominantly in the nasal cavity the right side with one lymph node on the right also apparently involved with this CD20 positive lymphoma treated with 4 cycles of R.-CVP using modified doses for cycles 2 and 3 because of neutropenia, severe weakness, fatigue, atrial fibrillation all required hospitalization after cycle 1. Last chemotherapy given on 04/13/2011 representing cycle #4. After chemotherapy she received radiation therapy by Dr. Sondra Come with resultant CR by PET scan performed on 09/17/2011.  #2. Atrial fibrillation, status post pacemaker #3. Worsening lower extremity weakness, suggestive of myelopathy.    PLAN:  #1. Await today's laboratory tests and if abnormal, additional interventions will be attempted. #2. Suggest neurology evaluation. #2. In the presence of her daughter, followup was scheduled for 6 months with  lab tests at that time.   All questions were answered. The patient knows to call the clinic with any problems, questions or concerns. We can certainly see the patient much sooner if necessary.   I spent 25 minutes counseling the patient face to face. The total time spent in the appointment was 30 minutes.    Doroteo Bradford, MD 12/02/2013 10:12 AM  DISCLAIMER:  This note was dictated with voice recognition software.  Similar sounding words can inadvertently be transcribed inaccurately and may not be corrected upon review.

## 2013-12-01 NOTE — Progress Notes (Unsigned)
Sierra Good presented for [employer]. Labs per MD order drawn via Peripheral Line 23 gauge needle inserted in left hand  Good blood return present. Procedure without incident.  Needle removed intact. Patient tolerated procedure well.

## 2013-12-04 LAB — BETA 2 MICROGLOBULIN, SERUM: BETA 2 MICROGLOBULIN: 2.75 mg/L — AB (ref <=2.51)

## 2013-12-11 ENCOUNTER — Encounter: Payer: Self-pay | Admitting: Internal Medicine

## 2013-12-11 ENCOUNTER — Ambulatory Visit (INDEPENDENT_AMBULATORY_CARE_PROVIDER_SITE_OTHER): Payer: Medicare Other | Admitting: Internal Medicine

## 2013-12-11 VITALS — BP 139/75 | HR 78 | Ht 65.5 in | Wt 145.0 lb

## 2013-12-11 DIAGNOSIS — I10 Essential (primary) hypertension: Secondary | ICD-10-CM

## 2013-12-11 DIAGNOSIS — I498 Other specified cardiac arrhythmias: Secondary | ICD-10-CM

## 2013-12-11 DIAGNOSIS — I4891 Unspecified atrial fibrillation: Secondary | ICD-10-CM

## 2013-12-11 DIAGNOSIS — I4819 Other persistent atrial fibrillation: Secondary | ICD-10-CM

## 2013-12-11 LAB — MDC_IDC_ENUM_SESS_TYPE_INCLINIC
Battery Remaining Longevity: 127 mo
Battery Voltage: 2.8 V
Brady Statistic AP VP Percent: 1 %
Brady Statistic AS VP Percent: 0 %
Brady Statistic AS VS Percent: 98 %
Lead Channel Impedance Value: 713 Ohm
Lead Channel Pacing Threshold Amplitude: 0.5 V
Lead Channel Pacing Threshold Pulse Width: 0.4 ms
Lead Channel Sensing Intrinsic Amplitude: 15.67 mV
Lead Channel Setting Pacing Amplitude: 2.5 V
Lead Channel Setting Pacing Amplitude: 3.5 V
Lead Channel Setting Sensing Sensitivity: 5.6 mV
MDC IDC MSMT BATTERY IMPEDANCE: 100 Ohm
MDC IDC MSMT LEADCHNL RA SENSING INTR AMPL: 0.35 mV
MDC IDC MSMT LEADCHNL RV IMPEDANCE VALUE: 529 Ohm
MDC IDC SESS DTM: 20150706145326
MDC IDC SET LEADCHNL RV PACING PULSEWIDTH: 0.4 ms
MDC IDC STAT BRADY AP VS PERCENT: 1 %

## 2013-12-11 MED ORDER — AMIODARONE HCL 200 MG PO TABS: ORAL_TABLET | ORAL | Status: DC

## 2013-12-11 NOTE — Progress Notes (Signed)
HPI Sierra Good returns today for followup. She is a pleasant 80 yo woman with a h/o symptomatic bradycardia, PAF with a RVR, s/p PPM. She states that she feels terrible and has no energy. She is weak. No syncope. She was cardioverted 9 months ago and returned to NSR but amio was discontinued and she reverted to atrial fib.  Allergies  Allergen Reactions  . Statins Other (See Comments)    Causes leg cramps and muscle weakness     Current Outpatient Prescriptions  Medication Sig Dispense Refill  . acetaminophen (TYLENOL) 500 MG tablet Take 500 mg by mouth every 8 (eight) hours as needed.      . Calcium-Vitamin D (CALTRATE 600 PLUS-VIT D PO) Take 1 tablet by mouth daily.      . Cholecalciferol (VITAMIN D3) 3000 UNITS TABS Take 1,000 Units by mouth daily.      . clobetasol cream (TEMOVATE) 4.40 % Apply 1 application topically 2 (two) times daily.       Marland Kitchen dexlansoprazole (DEXILANT) 60 MG capsule Take 60 mg by mouth daily.      Marland Kitchen diltiazem (CARDIZEM CD) 120 MG 24 hr capsule Take 1 capsule (120 mg total) by mouth daily.  60 capsule  0  . guaiFENesin (MUCINEX) 600 MG 12 hr tablet Take by mouth 2 (two) times daily as needed.      Marland Kitchen guaifenesin (ROBITUSSIN) 100 MG/5ML syrup Take 200 mg by mouth 3 (three) times daily as needed for cough.      . lactose free nutrition (BOOST PLUS) LIQD Take 237 mLs by mouth 3 (three) times daily with meals.  30 Can  0  . loratadine (CLARITIN) 10 MG tablet Take 10 mg by mouth daily.      . Multiple Vitamins-Minerals (CENTRUM SILVER ULTRA WOMENS) TABS Take 1 tablet by mouth daily.        . ondansetron (ZOFRAN) 4 MG tablet Take 4 mg by mouth every 6 (six) hours as needed for nausea or vomiting.      . sertraline (ZOLOFT) 50 MG tablet Take 50 mg by mouth at bedtime.      . traZODone (DESYREL) 50 MG tablet Take 50 mg by mouth at bedtime. Takes 1/2 tablet at bedtime      . warfarin (COUMADIN) 4 MG tablet Take 3 mg by mouth daily.        No current  facility-administered medications for this visit.   Facility-Administered Medications Ordered in Other Visits  Medication Dose Route Frequency Provider Last Rate Last Dose  . heparin lock flush 100 unit/mL  500 Units Intravenous Once Baird Cancer, PA-C      . sodium chloride 0.9 % injection 10 mL  10 mL Intravenous PRN Baird Cancer, PA-C         Past Medical History  Diagnosis Date  . GERD (gastroesophageal reflux disease)        . Hyperlipidemia   . Hypertension   . Osteoarthritis of lumbar spine   . PONV (postoperative nausea and vomiting) 2009    after total hip  . Deviated nasal septum     pt had septoplasty  . Nasal turbinate hypertrophy     bilateral  . Cancer     nasal cancer  . Cancer     cancer in the nose and in lower jaw  . Atrial fibrillation   . Dizziness   . Itching   . Diffuse large B cell lymphoma  Dr. Tressie Stalker -Nasal cavity, right sided lymph adenopathy  . OAB (overactive bladder)   . Tubulovillous adenoma of colon 10/2008    w/ HG dysplasia    ROS:   All systems reviewed and negative except as noted in the HPI.   Past Surgical History  Procedure Laterality Date  . Total hip arthroplasty  2009    Right, [facility] (general)  . Bunionectomy  20+yrs ago    bilateral, [facility]  . Hammer toe surgery      20+ yrs ago  . Cataract extraction w/ intraocular lens  implant, bilateral  2010    [facility]  . Nasal septoplasty w/ turbinoplasty  12/18/2010    Procedure: NASAL SEPTOPLASTY WITH TURBINATE REDUCTION;  Surgeon: Ascencion Dike;  Location: [facility];  Service: ENT;  Laterality: Bilateral;  . Colon surgery  10/29/2008    Ingram-right hemicolectomy for tubulovillous adenoma (3cm) with HG glandular dysplasia  . Portacath placement  02/10/2011    Procedure: INSERTION PORT-A-CATH;  Surgeon: Scherry Ran;  Location: [facility];  Service: General;  Laterality: Left;  left subclavian  . Skin cancer removal  6/13  . Colonoscopy  09/28/2002    RMR: Normal colonic mucosa.   Redundant and elongated but otherwise normal appearing colon/normal rectum  . Colonoscopy  09/11/2008    RMR: Normal rectum/Diminutive sigmoid polyp status post snare removal/  Flat sessile semilunar lesion at and in the ileocecal valve status post hot snare piecemeal debulking as described above  . Colonoscopy  01/06/10    Rourk-normal rectum, normal residual colon status post right hemicolectomy. Next colonoscopy in 01/2015 health permiting  . Esophagogastroduodenoscopy (egd) with esophageal dilation  05/23/2012    MOQ:[id number] ring-dilated as described above. Hiatal hernia  . Esophagogastroduodenoscopy Left 03/06/2013    Procedure: ESOPHAGOGASTRODUODENOSCOPY (EGD);  Surgeon: Jerene Bears, MD;  Location: [facility];  Service: Gastroenterology;  Laterality: Left;  . Pacemaker insertion Right 02/16/2013  . Port-a-cath removal Left 02/16/2013     Family History  Problem Relation Age of Onset  . Pseudochol deficiency Neg Hx   . Malignant hyperthermia Neg Hx   . Hypotension Neg Hx   . Anesthesia problems Neg Hx   . Cancer Mother   . Stroke Father      History   Social History  . Marital Status: Widowed    Spouse Name: N/A    Number of Children: N/A  . Years of Education: N/A   Occupational History  . Not on file.   Social History Main Topics  . Smoking status: Never Smoker   . Smokeless tobacco: Never Used  . Alcohol Use: No  . Drug Use: No  . Sexual Activity: Yes    Birth Control/ Protection: Post-menopausal   Other Topics Concern  . Not on file   Social History Narrative  . No narrative on file     BP 139/75  Pulse 78  Ht 5' 5.5" (1.664 m)  Wt 145 lb (65.772 kg)  BMI 23.75 kg/m2  Physical Exam:  stable appearing 80 yo woman, NAD HEENT: Unremarkable Neck:  No JVD, no thyromegally Back:  No CVA tenderness Lungs:  Clear with no wheezes, rales or rhonchi; well healed PPM incision. HEART:  Regular rate rhythm, no murmurs, no rubs, no clicks Abd:  soft,  positive bowel sounds, no organomegally, no rebound, no guarding Ext:  2 plus pulses, no edema, no cyanosis, no clubbing Skin:  No rashes no nodules Neuro:  CN II through XII intact, motor grossly intact  DEVICE  Normal device function.  See PaceArt for details.   Assess/Plan:

## 2013-12-11 NOTE — Assessment & Plan Note (Signed)
Her blood pressure is minimally elevated. Will follow and continue her current meds for HTN.

## 2013-12-11 NOTE — Patient Instructions (Signed)
Your physician recommends that you schedule a follow-up appointment in: 4 weeks with Dr. Lovena Le and next week with the Coumadin clinic  Your physician has recommended you make the following change in your medication:   AMIODARONE 200 MG TWICE DAILY  Thank you for choosing [facility]!!

## 2013-12-11 NOTE — Assessment & Plan Note (Signed)
It is unclear that her atrial fib is behind her fatigue and malaise but it is the only easy thing we can do to try and make her feel better. We will start amio 200 mg bid and plan to DCCV in approx. 4 weeks. She will return to our coumadin clinic for careful INR monitoring as she will likely not need as much coumadin.

## 2013-12-13 ENCOUNTER — Other Ambulatory Visit: Payer: Self-pay | Admitting: *Deleted

## 2013-12-13 MED ORDER — AMIODARONE HCL 200 MG PO TABS: 200.0000 mg | ORAL_TABLET | Freq: Two times a day (BID) | ORAL | Status: DC

## 2013-12-13 NOTE — Progress Notes (Signed)
Pt needed to have amiodarone sent to [facility], per pt is in nursing home long term

## 2013-12-18 ENCOUNTER — Encounter: Payer: Self-pay | Admitting: Internal Medicine

## 2013-12-22 ENCOUNTER — Ambulatory Visit (INDEPENDENT_AMBULATORY_CARE_PROVIDER_SITE_OTHER): Payer: Medicare Other | Admitting: *Deleted

## 2013-12-22 DIAGNOSIS — Z5181 Encounter for therapeutic drug level monitoring: Secondary | ICD-10-CM | POA: Insufficient documentation

## 2013-12-22 DIAGNOSIS — I4891 Unspecified atrial fibrillation: Secondary | ICD-10-CM

## 2013-12-22 LAB — POCT INR: INR: 2.7

## 2013-12-26 ENCOUNTER — Ambulatory Visit (INDEPENDENT_AMBULATORY_CARE_PROVIDER_SITE_OTHER): Payer: Medicare Other | Admitting: *Deleted

## 2013-12-26 DIAGNOSIS — I4891 Unspecified atrial fibrillation: Secondary | ICD-10-CM

## 2013-12-26 DIAGNOSIS — Z5181 Encounter for therapeutic drug level monitoring: Secondary | ICD-10-CM

## 2013-12-26 LAB — POCT INR: INR: 2.9

## 2014-01-09 ENCOUNTER — Ambulatory Visit (INDEPENDENT_AMBULATORY_CARE_PROVIDER_SITE_OTHER): Payer: Medicare Other | Admitting: *Deleted

## 2014-01-09 DIAGNOSIS — Z5181 Encounter for therapeutic drug level monitoring: Secondary | ICD-10-CM

## 2014-01-09 DIAGNOSIS — I4891 Unspecified atrial fibrillation: Secondary | ICD-10-CM

## 2014-01-09 LAB — POCT INR: INR: 1.8

## 2014-01-16 ENCOUNTER — Ambulatory Visit (INDEPENDENT_AMBULATORY_CARE_PROVIDER_SITE_OTHER): Payer: Medicare Other | Admitting: *Deleted

## 2014-01-16 DIAGNOSIS — I4891 Unspecified atrial fibrillation: Secondary | ICD-10-CM

## 2014-01-16 DIAGNOSIS — Z5181 Encounter for therapeutic drug level monitoring: Secondary | ICD-10-CM

## 2014-01-16 LAB — POCT INR: INR: 3.8

## 2014-01-23 LAB — PROTIME-INR: INR: 3.8 — AB (ref 0.9–1.1)

## 2014-01-25 ENCOUNTER — Ambulatory Visit (INDEPENDENT_AMBULATORY_CARE_PROVIDER_SITE_OTHER): Payer: Medicare Other | Admitting: *Deleted

## 2014-01-25 DIAGNOSIS — Z5181 Encounter for therapeutic drug level monitoring: Secondary | ICD-10-CM

## 2014-02-02 ENCOUNTER — Encounter: Payer: Self-pay | Admitting: *Deleted

## 2014-02-02 ENCOUNTER — Telehealth: Payer: Self-pay | Admitting: Cardiology

## 2014-02-02 NOTE — Telephone Encounter (Signed)
Pt will need another INR next week then will discuss DCCV.  See last note.

## 2014-02-02 NOTE — Telephone Encounter (Signed)
Want to proceed with cardio version. Requesting that Dr Bronson Ing  do at [facility] next week

## 2014-02-02 NOTE — Patient Instructions (Signed)
Daughter walked in to let me know they are ready to proceed with cardioversion.  Pt resides at [facility] and they were suppose to obtain INR on 8/25 thru [facility] and they have no results and don't think it was done.  They are to check with [facility].  Gave daughter appt for pt to come in 02/06/14 for INR check.  If pt is therapeutic then, will need to send message to Dr Jacolyn Reedy to see if OK to schedule DCCV.  Daughter would like to have procedure done at [facility] by Dr Bronson Ing as it is closer and will be easier on pt and family.

## 2014-02-06 ENCOUNTER — Ambulatory Visit (INDEPENDENT_AMBULATORY_CARE_PROVIDER_SITE_OTHER): Payer: Medicare Other | Admitting: *Deleted

## 2014-02-06 DIAGNOSIS — I4891 Unspecified atrial fibrillation: Secondary | ICD-10-CM

## 2014-02-06 DIAGNOSIS — Z5181 Encounter for therapeutic drug level monitoring: Secondary | ICD-10-CM

## 2014-02-06 LAB — POCT INR: INR: 3.9

## 2014-02-13 ENCOUNTER — Ambulatory Visit (INDEPENDENT_AMBULATORY_CARE_PROVIDER_SITE_OTHER): Payer: Medicare Other | Admitting: *Deleted

## 2014-02-13 DIAGNOSIS — Z5181 Encounter for therapeutic drug level monitoring: Secondary | ICD-10-CM

## 2014-02-13 DIAGNOSIS — I4891 Unspecified atrial fibrillation: Secondary | ICD-10-CM

## 2014-02-13 LAB — POCT INR: INR: 3.4

## 2014-02-16 ENCOUNTER — Other Ambulatory Visit: Payer: Self-pay | Admitting: Cardiovascular Disease

## 2014-02-21 ENCOUNTER — Telehealth: Payer: Self-pay | Admitting: *Deleted

## 2014-02-21 ENCOUNTER — Encounter (HOSPITAL_COMMUNITY)
Admission: RE | Disposition: A | Discharge status: Home / Self Care | Payer: Self-pay | Source: Ambulatory Visit | Attending: Cardiovascular Disease

## 2014-02-21 ENCOUNTER — Encounter (HOSPITAL_COMMUNITY): Payer: Self-pay | Admitting: Anesthesiology

## 2014-02-21 ENCOUNTER — Ambulatory Visit (INDEPENDENT_AMBULATORY_CARE_PROVIDER_SITE_OTHER): Payer: Medicare Other | Admitting: Cardiovascular Disease

## 2014-02-21 ENCOUNTER — Ambulatory Visit (HOSPITAL_COMMUNITY)
Admission: RE | Admit: 2014-02-21 | Discharge: 2014-02-21 | Disposition: A | Discharge status: Home / Self Care | Payer: Medicare Other | Source: Ambulatory Visit | Attending: Cardiovascular Disease | Admitting: Cardiovascular Disease

## 2014-02-21 DIAGNOSIS — Z7901 Long term (current) use of anticoagulants: Secondary | ICD-10-CM

## 2014-02-21 DIAGNOSIS — Z01818 Encounter for other preprocedural examination: Secondary | ICD-10-CM | POA: Insufficient documentation

## 2014-02-21 DIAGNOSIS — Z5181 Encounter for therapeutic drug level monitoring: Secondary | ICD-10-CM

## 2014-02-21 DIAGNOSIS — I4891 Unspecified atrial fibrillation: Secondary | ICD-10-CM

## 2014-02-21 DIAGNOSIS — I10 Essential (primary) hypertension: Secondary | ICD-10-CM | POA: Diagnosis not present

## 2014-02-21 DIAGNOSIS — Z538 Procedure and treatment not carried out for other reasons: Secondary | ICD-10-CM | POA: Insufficient documentation

## 2014-02-21 DIAGNOSIS — Z0189 Encounter for other specified special examinations: Secondary | ICD-10-CM

## 2014-02-21 LAB — BASIC METABOLIC PANEL
ANION GAP: 12 (ref 5–15)
BUN: 25 mg/dL — AB (ref 6–23)
CO2: 28 mEq/L (ref 19–32)
Calcium: 9.8 mg/dL (ref 8.4–10.5)
Chloride: 100 mEq/L (ref 96–112)
Creatinine, Ser: 0.94 mg/dL (ref 0.50–1.10)
GFR, EST AFRICAN AMERICAN: 61 mL/min — AB (ref >90)
GFR, EST NON AFRICAN AMERICAN: 52 mL/min — AB (ref >90)
Glucose, Bld: 91 mg/dL (ref 70–99)
POTASSIUM: 4.5 meq/L (ref 3.7–5.3)
Sodium: 140 mEq/L (ref 137–147)

## 2014-02-21 LAB — HEMOGLOBIN AND HEMATOCRIT, BLOOD
HCT: 46.4 % — ABNORMAL HIGH (ref 36.0–46.0)
Hemoglobin: 15.4 g/dL — ABNORMAL HIGH (ref 12.0–15.0)

## 2014-02-21 LAB — PROTIME-INR
INR: 2.76 — AB (ref 0.00–1.49)
Prothrombin Time: 29.2 seconds — ABNORMAL HIGH (ref 11.6–15.2)

## 2014-02-21 SURGERY — CANCELLED PROCEDURE

## 2014-02-21 MED ORDER — APIXABAN 5 MG PO TABS: 5.0000 mg | ORAL_TABLET | Freq: Two times a day (BID) | ORAL | Status: DC

## 2014-02-21 NOTE — Anesthesia Preprocedure Evaluation (Deleted)
Anesthesia Evaluation  Patient identified by MRN, date of birth, ID band Patient awake    Reviewed: Allergy & Precautions, H&P , NPO status , Patient's Chart, lab work & pertinent test results  History of Anesthesia Complications (+) PONV and history of anesthetic complications  Airway Mallampati: I  Neck ROM: Full    Dental  (+) Teeth Intact   Pulmonary neg pulmonary ROS,  breath sounds clear to auscultation        Cardiovascular hypertension, Pt. on medications + dysrhythmias Atrial Fibrillation Rhythm:Regular Rate:Normal     Neuro/Psych    GI/Hepatic GERD-  Medicated and Controlled,  Endo/Other    Renal/GU      Musculoskeletal   Abdominal   Peds  Hematology lymphoma   Anesthesia Other Findings   Reproductive/Obstetrics                           Anesthesia Physical Anesthesia Plan  ASA: III  Anesthesia Plan: MAC   Post-op Pain Management:    Induction: Intravenous  Airway Management Planned:   Additional Equipment:   Intra-op Plan:   Post-operative Plan:   Informed Consent: I have reviewed the patients History and Physical, chart, labs and discussed the procedure including the risks, benefits and alternatives for the proposed anesthesia with the patient or authorized representative who has indicated his/her understanding and acceptance.     Plan Discussed with:   Anesthesia Plan Comments:         Anesthesia Quick Evaluation

## 2014-02-21 NOTE — Telephone Encounter (Signed)
D/c'd warfarin and escribed eliquis 5 mg BID per Dr. Bronson Ing

## 2014-02-21 NOTE — Telephone Encounter (Signed)
Message copied by Desma Mcgregor on Wed Feb 21, 2014 11:48 AM ------      Message from: Kate Sable A      Created: Wed Feb 21, 2014 10:09 AM      Regarding: switch to Eliquis       I spoke with patient and daughter this morning at time of planned cardioversion.       Please d/c warfarin and switch to Eliquis 5 mg bid.            Jamesetta So ------

## 2014-02-21 NOTE — Progress Notes (Signed)
Patient ID: Sierra Good, female   DOB: 11-Sep-1922, 80 y.o.   MRN: [id number]       I evaluated the patient in pre-op at [facility] for a scheduled elective cardioversion. The pre-op ECG demonstrated atrial pacing with P waves indicative of sinus rhythm. When her device was evaluated in early July, atrial fibrillation was seen >90% of the time. Because she had been feeling fatigued, amiodarone was started by Dr. Cristopher Peru and a cardioversion was planned. I spoke with Dr. Lovena Le and the recommendation will be to continue amiodarone 200 mg bid x one more month, and then reduce the dose to 200 mg daily. He plans to f/u with her in the office in 3-4 months. I explained this management strategy to both the patient and daughter [name]), and they are in agreement. The daughter did request to switch anticoagulation from warfarin to one which did not require frequent monitoring. I plan to switch to Eliquis 5 mg bid as creatinine is less than 1.5 and weight > 60 kg. The cardioversion was cancelled and the nursing and anesthesia staff were informed.  Time spent: 40 minutes, of which >50% was spent counseling the patient and family on further management of atrial fibrillation.   Review of Systems: As per "subjective", otherwise negative.  Allergies  Allergen Reactions  . Statins Other (See Comments)    Causes leg cramps and muscle weakness    No current facility-administered medications for this visit.   No current outpatient prescriptions on file.   Facility-Administered Medications Ordered in Other Visits  Medication Dose Route Frequency Provider Last Rate Last Dose  . heparin lock flush 100 unit/mL  500 Units Intravenous Once Baird Cancer, PA-C      . sodium chloride 0.9 % injection 10 mL  10 mL Intravenous PRN Baird Cancer, PA-C        Past Medical History  Diagnosis Date  . GERD (gastroesophageal reflux disease)        . Hyperlipidemia   . Hypertension   . Osteoarthritis of  lumbar spine   . PONV (postoperative nausea and vomiting) 2009    after total hip  . Deviated nasal septum     pt had septoplasty  . Nasal turbinate hypertrophy     bilateral  . Cancer     nasal cancer  . Cancer     cancer in the nose and in lower jaw  . Atrial fibrillation   . Dizziness   . Itching   . Diffuse large B cell lymphoma     Dr. Tressie Stalker -Nasal cavity, right sided lymph adenopathy  . OAB (overactive bladder)   . 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 Total hip arthroplasty  2009    Right, [facility] (general)  . Bunionectomy  20+yrs ago    bilateral, [facility]  . Hammer toe surgery      20+ yrs ago  . Cataract extraction w/ intraocular lens  implant, bilateral  2010    [facility]  . Nasal septoplasty w/ turbinoplasty  12/18/2010    Procedure: NASAL SEPTOPLASTY WITH TURBINATE REDUCTION;  Surgeon: Ascencion Dike;  Location: [facility];  Service: ENT;  Laterality: Bilateral;  . Colon surgery  10/29/2008    Ingram-right hemicolectomy for tubulovillous adenoma (3cm) with HG glandular dysplasia  . Portacath placement  02/10/2011    Procedure: INSERTION PORT-A-CATH;  Surgeon: Scherry Ran;  Location:  [facility];  Service: General;  Laterality: Left;  left subclavian  . Skin cancer removal  6/13  . Colonoscopy  09/28/2002    RMR: Normal colonic mucosa.  Redundant and elongated but otherwise normal appearing colon/normal rectum  . Colonoscopy  09/11/2008    RMR: Normal rectum/Diminutive sigmoid polyp status post snare removal/  Flat sessile semilunar lesion at and in the ileocecal valve status post hot snare piecemeal debulking as described above  . Colonoscopy  01/06/10    Rourk-normal rectum, normal residual colon status post right hemicolectomy. Next colonoscopy in 01/2015 health permiting  . Esophagogastroduodenoscopy (egd) with esophageal dilation  05/23/2012    [id number] ring-dilated as described above. Hiatal hernia   . Esophagogastroduodenoscopy Left 03/06/2013    Procedure: ESOPHAGOGASTRODUODENOSCOPY (EGD);  Surgeon: Jerene Bears, MD;  Location: [facility];  Service: Gastroenterology;  Laterality: Left;  . Pacemaker insertion Right 02/16/2013  . Port-a-cath removal Left 02/16/2013    History   Social History  . Marital Status: Widowed    Spouse Name: N/A    Number of Children: N/A  . Years of Education: N/A   Occupational History  . Not on file.   Social History Main Topics  . Smoking status: Never Smoker   . Smokeless tobacco: Never Used  . Alcohol Use: No  . Drug Use: No  . Sexual Activity: Yes    Birth Control/ Protection: Post-menopausal   Other Topics Concern  . Not on file   Social History Narrative  . No narrative on file           Kate Sable, M.D., F.A.C.C.

## 2014-02-21 NOTE — Progress Notes (Signed)
Pt arrived to Short stay via w/c.  PT/INR, H & H, and B met drawn.   Pt in normal sinus EKG done.  Dr Bronson Ing in to assess patient and talk with her daughter.  Procedure cancelled for today.  Pt discharged via w/c

## 2014-02-21 NOTE — Anesthesia Preprocedure Evaluation (Deleted)

## 2014-02-22 ENCOUNTER — Telehealth: Payer: Self-pay | Admitting: Internal Medicine

## 2014-02-22 NOTE — Telephone Encounter (Signed)
Please see refill bin / tgs  °

## 2014-02-28 ENCOUNTER — Encounter: Payer: Self-pay | Admitting: *Deleted

## 2014-03-06 ENCOUNTER — Telehealth: Payer: Self-pay | Admitting: *Deleted

## 2014-03-06 NOTE — Telephone Encounter (Signed)
Spoke with med tech.  Stated she just spoke with Vernie Murders RN and this is being handled by her and Bunnie Domino FNP.

## 2014-03-06 NOTE — Telephone Encounter (Signed)
Forwarded Dr.Koneswaran's note that pt will stop coumadin,faxed his ov note and rx written by K.lawrence NP to stop Coumadin and continue Eliquis

## 2014-03-06 NOTE — Telephone Encounter (Signed)
Pt has been on coumadin and eliquis for four days, [facility] states they did not have order to stop coumadin before starting eliquis. Medi tech just happen to notice this and was calling to make sure this was correct. She needs to know what to do from here. Pt has not had either eliqquis of coumadin today the don't dose till 4 pm

## 2014-03-14 ENCOUNTER — Telehealth: Payer: Self-pay | Admitting: Cardiology

## 2014-03-14 ENCOUNTER — Ambulatory Visit (INDEPENDENT_AMBULATORY_CARE_PROVIDER_SITE_OTHER): Payer: Medicare Other | Admitting: *Deleted

## 2014-03-14 ENCOUNTER — Encounter: Payer: Self-pay | Admitting: Internal Medicine

## 2014-03-14 DIAGNOSIS — I495 Sick sinus syndrome: Secondary | ICD-10-CM

## 2014-03-14 LAB — MDC_IDC_ENUM_SESS_TYPE_REMOTE
Battery Impedance: 100 Ohm
Battery Voltage: 2.79 V
Brady Statistic AP VS Percent: 40 %
Brady Statistic AS VP Percent: 1 %
Lead Channel Pacing Threshold Amplitude: 0.5 V
Lead Channel Pacing Threshold Amplitude: 0.5 V
Lead Channel Pacing Threshold Pulse Width: 0.4 ms
Lead Channel Sensing Intrinsic Amplitude: 11.2 mV
Lead Channel Setting Pacing Pulse Width: 0.4 ms
MDC IDC MSMT BATTERY REMAINING LONGEVITY: 124 mo
MDC IDC MSMT LEADCHNL RA IMPEDANCE VALUE: 753 Ohm
MDC IDC MSMT LEADCHNL RV IMPEDANCE VALUE: 527 Ohm
MDC IDC MSMT LEADCHNL RV PACING THRESHOLD PULSEWIDTH: 0.4 ms
MDC IDC SESS DTM: 20151007211532
MDC IDC SET LEADCHNL RA PACING AMPLITUDE: 2 V
MDC IDC SET LEADCHNL RV PACING AMPLITUDE: 2.5 V
MDC IDC SET LEADCHNL RV SENSING SENSITIVITY: 5.6 mV
MDC IDC STAT BRADY AP VP PERCENT: 48 %
MDC IDC STAT BRADY AS VS PERCENT: 12 %

## 2014-03-14 NOTE — Telephone Encounter (Signed)
Spoke with pt and reminded pt of remote transmission that is due today. Pt verbalized understanding.   

## 2014-03-15 NOTE — Progress Notes (Signed)
Remote pacemaker transmission.   

## 2014-03-29 ENCOUNTER — Encounter: Payer: Self-pay | Admitting: Cardiovascular Disease

## 2014-03-29 ENCOUNTER — Ambulatory Visit (INDEPENDENT_AMBULATORY_CARE_PROVIDER_SITE_OTHER): Payer: Medicare Other | Admitting: Cardiovascular Disease

## 2014-03-29 VITALS — BP 145/82 | HR 63 | Ht 67.0 in | Wt 149.5 lb

## 2014-03-29 DIAGNOSIS — I10 Essential (primary) hypertension: Secondary | ICD-10-CM

## 2014-03-29 DIAGNOSIS — Z950 Presence of cardiac pacemaker: Secondary | ICD-10-CM

## 2014-03-29 DIAGNOSIS — R5383 Other fatigue: Secondary | ICD-10-CM

## 2014-03-29 DIAGNOSIS — Z79899 Other long term (current) drug therapy: Secondary | ICD-10-CM

## 2014-03-29 DIAGNOSIS — I4891 Unspecified atrial fibrillation: Secondary | ICD-10-CM

## 2014-03-29 DIAGNOSIS — Z7901 Long term (current) use of anticoagulants: Secondary | ICD-10-CM

## 2014-03-29 DIAGNOSIS — Z5181 Encounter for therapeutic drug level monitoring: Secondary | ICD-10-CM

## 2014-03-29 MED ORDER — AMIODARONE HCL 200 MG PO TABS: 200.0000 mg | ORAL_TABLET | Freq: Every day | ORAL | Status: AC

## 2014-03-29 NOTE — Progress Notes (Signed)
Patient ID: Sierra Good, female   DOB: Apr 27, 1923, 80 y.o.   MRN: [id number]      SUBJECTIVE: The patient is here to followup for management of atrial fibrillation. She is currently taking amiodarone 200 mg twice daily and Eliquis 5 mg twice daily for anticoagulation. I evaluated the patient on 02/21/14 in pre-op at [facility] for a scheduled elective cardioversion. The pre-op ECG demonstrated atrial pacing with P waves indicative of sinus rhythm. When her device was evaluated in early July, atrial fibrillation was seen >90% of the time. Device interrogation on 03/14/2014 demonstrated normal device function, 9 mode switches with a total burden of 58%, with no high ventricular rates noted.  She remains fatigued and her daughter, [name], wonders if she is taking too many medications which have not improved her symptoms. The patient denies chest pain and shortness of breath.   Review of Systems: As per "subjective", otherwise negative.  Allergies  Allergen Reactions  . Statins Other (See Comments)    Causes leg cramps and muscle weakness    Current Outpatient Prescriptions  Medication Sig Dispense Refill  . acetaminophen (TYLENOL) 500 MG tablet Take 500 mg by mouth every 8 (eight) hours as needed.      Marland Kitchen amiodarone (PACERONE) 200 MG tablet Take 1 tablet (200 mg total) by mouth 2 (two) times daily.  60 tablet  2  . apixaban (ELIQUIS) 5 MG TABS tablet Take 1 tablet (5 mg total) by mouth 2 (two) times daily.  60 tablet  3  . Calcium-Vitamin D (CALTRATE 600 PLUS-VIT D PO) Take 1 tablet by mouth daily.      . Cholecalciferol (VITAMIN D3) 3000 UNITS TABS Take 1,000 Units by mouth daily.      . clobetasol cream (TEMOVATE) 0.35 % Apply 1 application topically 2 (two) times daily.       Marland Kitchen dexlansoprazole (DEXILANT) 60 MG capsule Take 60 mg by mouth daily.      Marland Kitchen diltiazem (CARDIZEM CD) 120 MG 24 hr capsule Take 1 capsule (120 mg total) by mouth daily.  60 capsule  0  . guaiFENesin (MUCINEX) 600 MG 12  hr tablet Take by mouth 2 (two) times daily as needed.      Marland Kitchen guaifenesin (ROBITUSSIN) 100 MG/5ML syrup Take 200 mg by mouth 3 (three) times daily as needed for cough.      . lactose free nutrition (BOOST PLUS) LIQD Take 237 mLs by mouth 3 (three) times daily with meals.  30 Can  0  . loratadine (CLARITIN) 10 MG tablet Take 10 mg by mouth daily.      . Multiple Vitamins-Minerals (CENTRUM SILVER ULTRA WOMENS) TABS Take 1 tablet by mouth daily.        . ondansetron (ZOFRAN) 4 MG tablet Take 4 mg by mouth every 6 (six) hours as needed for nausea or vomiting.      Marland Kitchen oxybutynin (DITROPAN) 5 MG tablet Take 5 mg by mouth 2 (two) times daily.      . sertraline (ZOLOFT) 50 MG tablet Take 50 mg by mouth at bedtime.      . traZODone (DESYREL) 50 MG tablet Take 50 mg by mouth at bedtime. Takes 1/2 tablet at bedtime       No current facility-administered medications for this visit.   Facility-Administered Medications Ordered in Other Visits  Medication Dose Route Frequency Provider Last Rate Last Dose  . heparin lock flush 100 unit/mL  500 Units Intravenous Once [employer], PA-C      .  sodium chloride 0.9 % injection 10 mL  10 mL Intravenous PRN Baird Cancer, PA-C        Past Medical History  Diagnosis Date  . GERD (gastroesophageal reflux disease)        . Hyperlipidemia   . Hypertension   . Osteoarthritis of lumbar spine   . PONV (postoperative nausea and vomiting) 2009    after total hip  . Deviated nasal septum     pt had septoplasty  . Nasal turbinate hypertrophy     bilateral  . Cancer     nasal cancer  . Cancer     cancer in the nose and in lower jaw  . Atrial fibrillation   . Dizziness   . Itching   . Diffuse large B cell lymphoma     Dr. Tressie Stalker -Nasal cavity, right sided lymph adenopathy  . OAB (overactive bladder)   . 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 Total hip arthroplasty  2009     Right, [facility] (general)  . Bunionectomy  20+yrs ago    bilateral, [facility]  . Hammer toe surgery      20+ yrs ago  . Cataract extraction w/ intraocular lens  implant, bilateral  2010    [facility]  . Nasal septoplasty w/ turbinoplasty  12/18/2010    Procedure: NASAL SEPTOPLASTY WITH TURBINATE REDUCTION;  Surgeon: Ascencion Dike;  Location: [facility];  Service: ENT;  Laterality: Bilateral;  . Colon surgery  10/29/2008    Ingram-right hemicolectomy for tubulovillous adenoma (3cm) with HG glandular dysplasia  . Portacath placement  02/10/2011    Procedure: INSERTION PORT-A-CATH;  Surgeon: Scherry Ran;  Location: [facility];  Service: General;  Laterality: Left;  left subclavian  . Skin cancer removal  6/13  . Colonoscopy  09/28/2002    RMR: Normal colonic mucosa.  Redundant and elongated but otherwise normal appearing colon/normal rectum  . Colonoscopy  09/11/2008    RMR: Normal rectum/Diminutive sigmoid polyp status post snare removal/  Flat sessile semilunar lesion at and in the ileocecal valve status post hot snare piecemeal debulking as described above  . Colonoscopy  01/06/10    Rourk-normal rectum, normal residual colon status post right hemicolectomy. Next colonoscopy in 01/2015 health permiting  . Esophagogastroduodenoscopy (egd) with esophageal dilation  05/23/2012    [id number]:[id number] ring-dilated as described above. Hiatal hernia  . Esophagogastroduodenoscopy Left 03/06/2013    Procedure: ESOPHAGOGASTRODUODENOSCOPY (EGD);  Surgeon: Jerene Bears, MD;  Location: [facility];  Service: Gastroenterology;  Laterality: Left;  . Pacemaker insertion Right 02/16/2013  . Port-a-cath removal Left 02/16/2013    History   Social History  . Marital Status: Widowed    Spouse Name: N/A    Number of Children: N/A  . Years of Education: N/A   Occupational History  . Not on file.   Social History Main Topics  . Smoking status: Never Smoker   . Smokeless tobacco: Never Used  . Alcohol Use: No  . Drug Use: No  .  Sexual Activity: Yes    Birth Control/ Protection: Post-menopausal   Other Topics Concern  . Not on file   Social History Narrative  . No narrative on file     Filed Vitals:   03/29/14 1130  BP: 145/82  Pulse: 63  Height: 5\' 7"  (1.702 m)  Weight: 149 lb 8 oz (67.813 kg)  SpO2: 97%    PHYSICAL  EXAM General: NAD HEENT: Normal. Neck: No JVD, no thyromegaly. Lungs: Clear to auscultation bilaterally with normal respiratory effort. CV: Nondisplaced PMI.  Regular rate and rhythm, normal S1/S2, no S3/S4, no murmur. No pretibial or periankle edema.  No carotid bruit.  Normal pedal pulses.  Abdomen: Soft, nontender, no hepatosplenomegaly, no distention.  Neurologic: Alert and oriented x 3.  Psych: Normal affect. Skin: Normal. Musculoskeletal: Normal range of motion, no gross deformities. Extremities: No clubbing or cyanosis.   ECG: Most recent ECG reviewed.      ASSESSMENT AND PLAN: 1. Atrial fibrillation: Will reduce amiodarone to 200 mg daily. Continue Eliquis 5 mg bid for anticoagulation. Will f/u with Dr. Lovena Le in next few months. 2. Pacemaker: Device interrogation on 03/14/2014 demonstrated normal device function, 9 mode switches with a total burden of 58%, with no high ventricular rates noted.  3. Essential HTN: Reasonably controlled for age on current therapy. No changes. 4. Fatigue/polypharmacy: In effort to improve her energy levels, I will discontinue loratadine, Zoloft, and trazodone.  Dispo: f/u 6 months.  Kate Sable, M.D., F.A.C.C.

## 2014-03-29 NOTE — Patient Instructions (Signed)
   Decrease Amiodarone to 200mg  daily   Stop Loratadine, Zoloft, & Trazodone  Continue all other medications.   Your physician wants you to follow up in: 6 months.  You will receive a reminder letter in the mail one-two months in advance.  If you don't receive a letter, please call our office to schedule the follow up appointment

## 2014-03-30 ENCOUNTER — Encounter: Payer: Self-pay | Admitting: *Deleted

## 2014-04-11 ENCOUNTER — Ambulatory Visit: Payer: Medicare Other | Admitting: Cardiovascular Disease

## 2014-05-17 ENCOUNTER — Encounter (HOSPITAL_COMMUNITY): Payer: Self-pay | Admitting: Internal Medicine

## 2014-05-29 ENCOUNTER — Other Ambulatory Visit (HOSPITAL_COMMUNITY): Payer: Self-pay

## 2014-05-29 ENCOUNTER — Ambulatory Visit (HOSPITAL_COMMUNITY): Payer: Self-pay | Admitting: Hematology & Oncology

## 2014-06-04 ENCOUNTER — Other Ambulatory Visit (HOSPITAL_COMMUNITY): Payer: Self-pay

## 2014-06-04 DIAGNOSIS — C8591 Non-Hodgkin lymphoma, unspecified, lymph nodes of head, face, and neck: Secondary | ICD-10-CM

## 2014-06-10 NOTE — Progress Notes (Signed)
VYAS,DHRUV B., MD [place] [id number]  Diffuse large B-cell lymphoma, stage I AE vs  II-E, status post 4 cycles of CVP-R. followed by consolidative radiotherapy with primary disease presenting in the nasal cavity and neck. Last chemotherapy treatment was 04/13/2011.   Last PET/CT 09/2011 with no residual or recurrent hypermetabolic adenopathy. Focus within anterior L fifth rib, favored to be interval fracture  Afib on Eliquis  CURRENT THERAPY: Observation  INTERVAL HISTORY: Sierra Good 81 y.o. female returns for follow-up of her lymphoma.  She can no longer walk.  At assisted living she is now completely dependent on them for all care.  She finds this very difficult. She did better when she was in skilled care a year ago.  Over the past year in assisted living she has gradually declined. There is a question if she needs skilled care.  She no longer gets mammograms    MEDICAL HISTORY: Past Medical History  Diagnosis Date  . GERD (gastroesophageal reflux disease)        . Hyperlipidemia   . Hypertension   . Osteoarthritis of lumbar spine   . PONV (postoperative nausea and vomiting) 2009    after total hip  . Deviated nasal septum     pt had septoplasty  . Nasal turbinate hypertrophy     bilateral  . Cancer     nasal cancer  . Cancer     cancer in the nose and in lower jaw  . Atrial fibrillation   . Dizziness   . Itching   . Diffuse large B cell lymphoma     Dr. Tressie Stalker -Nasal cavity, right sided lymph adenopathy  . OAB (overactive bladder)   . Tubulovillous adenoma of colon 10/2008    w/ HG dysplasia    has HYPERLIPIDEMIA; HYPERTENSION; Atrial fibrillation; BRADYCARDIA; SYNCOPE, HX OF; Lymphoma of lymph nodes of head, face, and/or neck; Nausea alone; SOB (shortness of breath) on exertion; Dysphagia; GERD (gastroesophageal reflux disease); Long term (current) use of anticoagulants; Muscle weakness (generalized); Difficulty in walking(719.7);  Pacemaker; and Encounter for therapeutic drug monitoring on her problem list.     No history exists.     is allergic to statins.  Sierra Good does not currently have medications on file.  SURGICAL HISTORY: Past Surgical History  Procedure Laterality Date  . Total hip arthroplasty  2009    Right, [facility] (general)  . Bunionectomy  20+yrs ago    bilateral, [facility]  . Hammer toe surgery      20+ yrs ago  . Cataract extraction w/ intraocular lens  implant, bilateral  2010    [facility]  . Nasal septoplasty w/ turbinoplasty  12/18/2010    Procedure: NASAL SEPTOPLASTY WITH TURBINATE REDUCTION;  Surgeon: Ascencion Dike;  Location: [facility];  Service: ENT;  Laterality: Bilateral;  . Colon surgery  10/29/2008    Ingram-right hemicolectomy for tubulovillous adenoma (3cm) with HG glandular dysplasia  . Portacath placement  02/10/2011    Procedure: INSERTION PORT-A-CATH;  Surgeon: Scherry Ran;  Location: [facility];  Service: General;  Laterality: Left;  left subclavian  . Skin cancer removal  6/13  . Colonoscopy  09/28/2002    RMR: Normal colonic mucosa.  Redundant and elongated but otherwise normal appearing colon/normal rectum  . Colonoscopy  09/11/2008    RMR: Normal rectum/Diminutive sigmoid polyp status post snare removal/  Flat sessile semilunar lesion at and in the ileocecal valve status post hot snare piecemeal debulking as described  above  . Colonoscopy  01/06/10    Rourk-normal rectum, normal residual colon status post right hemicolectomy. Next colonoscopy in 01/2015 health permiting  . Esophagogastroduodenoscopy (egd) with esophageal dilation  05/23/2012    RCV:[id number]'P ring-dilated as described above. Hiatal hernia  . Esophagogastroduodenoscopy Left 03/06/2013    Procedure: ESOPHAGOGASTRODUODENOSCOPY (EGD);  Surgeon: Jerene Bears, MD;  Location: [facility];  Service: Gastroenterology;  Laterality: Left;  . Pacemaker insertion Right 02/16/2013  . Port-a-cath removal Left 02/16/2013  . Permanent  pacemaker insertion N/A 02/16/2013    Procedure: PERMANENT PACEMAKER INSERTION;  Surgeon: Evans Lance, MD;  Location: [facility] CATH LAB;  Service: Cardiovascular;  Laterality: N/A;    SOCIAL HISTORY: History   Social History  . Marital Status: Widowed    Spouse Name: N/A    Number of Children: N/A  . Years of Education: N/A   Occupational History  . Not on file.   Social History Main Topics  . Smoking status: Never Smoker   . Smokeless tobacco: Never Used  . Alcohol Use: No  . Drug Use: No  . Sexual Activity: Yes    Birth Control/ Protection: Post-menopausal   Other Topics Concern  . Not on file   Social History Narrative    FAMILY HISTORY: Family History  Problem Relation Age of Onset  . Pseudochol deficiency Neg Hx   . Malignant hyperthermia Neg Hx   . Hypotension Neg Hx   . Anesthesia problems Neg Hx   . Cancer Mother   . Stroke Father     Review of Systems  Constitutional: Negative for fever, chills, weight loss and malaise/fatigue.  HENT: Negative for congestion, hearing loss, nosebleeds, sore throat and tinnitus.   Eyes: Negative for blurred vision, double vision, pain and discharge.  Respiratory: Negative for cough, hemoptysis, sputum production, shortness of breath and wheezing.   Cardiovascular: Negative for chest pain, palpitations, claudication, leg swelling and PND.  Gastrointestinal: Negative for heartburn, nausea, vomiting, abdominal pain, diarrhea, constipation, blood in stool and melena.  Genitourinary: Positive for urgency and frequency. Negative for dysuria and hematuria.  Musculoskeletal: Positive for falls. Negative for myalgias and joint pain.       Unable to ambulate. Pain in R leg. Can no longer use her walker.  Skin: Negative for itching and rash.  Neurological: Negative for dizziness, tingling, tremors, sensory change, speech change, focal weakness, seizures, loss of consciousness, weakness and headaches.  Endo/Heme/Allergies: Does not  bruise/bleed easily.  Psychiatric/Behavioral: Negative for depression, suicidal ideas, memory loss and substance abuse. The patient is not nervous/anxious and does not have insomnia.     PHYSICAL EXAMINATION  ECOG PERFORMANCE STATUS: 2 - Symptomatic, <50% confined to bed  Note, not from her malignancy.   Filed Vitals:   06/11/14 1000  BP: 134/55  Pulse: 62  Temp: 97.9 F (36.6 C)  Resp: 18    Physical Exam  Constitutional: She is oriented to person, place, and time.  In wheelchair, pleasant, well groomed  HENT:  Head: Normocephalic and atraumatic.  Mouth/Throat: No oropharyngeal exudate.  Eyes: Conjunctivae and EOM are normal. Right eye exhibits no discharge. No scleral icterus.  Neck: Normal range of motion. Neck supple. No thyromegaly present.  Cardiovascular: Normal heart sounds.   Rate controlled, irreg irreg  Pulmonary/Chest: Effort normal and breath sounds normal. No respiratory distress. She has no wheezes.  Abdominal: Soft. Bowel sounds are normal. She exhibits no distension. There is no tenderness. There is no rebound and no guarding.  Musculoskeletal: Normal range  of motion. She exhibits no edema or tenderness.  Cannot stand  Lymphadenopathy:    She has no cervical adenopathy.  No appreciable adenopathy  Neurological: She is alert and oriented to person, place, and time. No cranial nerve deficit.  Skin: Skin is warm and dry.  Psychiatric: Mood, memory, affect and judgment normal.    LABORATORY DATA:  CBC    Component Value Date/Time   WBC 10.3 06/11/2014 1003   RBC 4.61 06/11/2014 1003   RBC 4.53 05/18/2013 0944   HGB 15.0 06/11/2014 1003   HCT 45.6 06/11/2014 1003   HCT 47 03/21/2012 0826   PLT 199 06/11/2014 1003   MCV 98.9 06/11/2014 1003   MCH 32.5 06/11/2014 1003   MCHC 32.9 06/11/2014 1003   RDW 15.1 06/11/2014 1003   LYMPHSABS 1.0 06/11/2014 1003   MONOABS 0.8 06/11/2014 1003   EOSABS 0.1 06/11/2014 1003   BASOSABS 0.0 06/11/2014 1003    CMP     Component Value Date/Time   NA 137 06/11/2014 1003   NA 141 03/21/2012 0828   K 3.9 06/11/2014 1003   K 4.0 03/21/2012 0828   CL 103 06/11/2014 1003   CL 100 03/21/2012 0828   CO2 26 06/11/2014 1003   GLUCOSE 85 06/11/2014 1003   BUN 20 06/11/2014 1003   BUN 14 03/21/2012 0828   CREATININE 1.00 06/11/2014 1003   CREATININE 0.87 03/21/2012 0828   CALCIUM 9.3 06/11/2014 1003   CALCIUM 10.4 03/21/2012 0828   PROT 6.6 06/11/2014 1003   PROT 6.6 03/21/2012 0828   ALBUMIN 4.0 06/11/2014 1003   AST 27 06/11/2014 1003   AST 17 03/21/2012 0828   ALT 40* 06/11/2014 1003   ALKPHOS 78 06/11/2014 1003   ALKPHOS 100 03/21/2012 0828   BILITOT 0.4 06/11/2014 1003   BILITOT 0.3 03/21/2012 0828   GFRNONAA 48* 06/11/2014 1003   GFRAA 56* 06/11/2014 1003       ASSESSMENT and THERAPY PLAN:    Lymphoma of lymph nodes of head, face, and/or neck Pleasant 81 year old female with a history of Stage IE DLBCL.  She is currently without evidence of recurrence.  Her major concern today is her gradual decline over the last year and inability to ambulate.  She lives in assisted living and is now dependent upon caregivers for all her daily needs. Labs today are at her baseline without significant or concerning change.  The patient is very interested in trying PT, we will see if we can arrange for a PT referral for her given her lack of ambulation and leg weakness. In regards to her NHL, we will see her back in 6 months with repeat labs and physical exam.  She and her daughter were advised to call in the interim with any problems or concerns.    All questions were answered. The patient knows to call the clinic with any problems, questions or concerns. We can certainly see the patient much sooner if necessary.  Molli Hazard 06/11/2014

## 2014-06-11 ENCOUNTER — Encounter (HOSPITAL_COMMUNITY): Payer: Medicare Other | Attending: Hematology & Oncology | Admitting: Hematology & Oncology

## 2014-06-11 ENCOUNTER — Encounter (HOSPITAL_COMMUNITY): Payer: Medicare Other | Attending: Hematology & Oncology

## 2014-06-11 ENCOUNTER — Encounter (HOSPITAL_COMMUNITY): Payer: Self-pay | Admitting: Hematology & Oncology

## 2014-06-11 VITALS — BP 134/55 | HR 62 | Temp 97.9°F | Resp 18

## 2014-06-11 DIAGNOSIS — Z993 Dependence on wheelchair: Secondary | ICD-10-CM | POA: Insufficient documentation

## 2014-06-11 DIAGNOSIS — I4891 Unspecified atrial fibrillation: Secondary | ICD-10-CM | POA: Diagnosis not present

## 2014-06-11 DIAGNOSIS — Z7901 Long term (current) use of anticoagulants: Secondary | ICD-10-CM | POA: Insufficient documentation

## 2014-06-11 DIAGNOSIS — C8591 Non-Hodgkin lymphoma, unspecified, lymph nodes of head, face, and neck: Secondary | ICD-10-CM

## 2014-06-11 DIAGNOSIS — M6281 Muscle weakness (generalized): Secondary | ICD-10-CM | POA: Diagnosis not present

## 2014-06-11 DIAGNOSIS — Z9221 Personal history of antineoplastic chemotherapy: Secondary | ICD-10-CM | POA: Insufficient documentation

## 2014-06-11 DIAGNOSIS — C8331 Diffuse large B-cell lymphoma, lymph nodes of head, face, and neck: Secondary | ICD-10-CM

## 2014-06-11 DIAGNOSIS — Z8579 Personal history of other malignant neoplasms of lymphoid, hematopoietic and related tissues: Secondary | ICD-10-CM | POA: Diagnosis not present

## 2014-06-11 DIAGNOSIS — R262 Difficulty in walking, not elsewhere classified: Secondary | ICD-10-CM | POA: Insufficient documentation

## 2014-06-11 DIAGNOSIS — Z08 Encounter for follow-up examination after completed treatment for malignant neoplasm: Secondary | ICD-10-CM | POA: Insufficient documentation

## 2014-06-11 LAB — CBC WITH DIFFERENTIAL/PLATELET
Basophils Absolute: 0 10*3/uL (ref 0.0–0.1)
Basophils Relative: 0 % (ref 0–1)
Eosinophils Absolute: 0.1 10*3/uL (ref 0.0–0.7)
Eosinophils Relative: 1 % (ref 0–5)
HEMATOCRIT: 45.6 % (ref 36.0–46.0)
HEMOGLOBIN: 15 g/dL (ref 12.0–15.0)
LYMPHS PCT: 10 % — AB (ref 12–46)
Lymphs Abs: 1 10*3/uL (ref 0.7–4.0)
MCH: 32.5 pg (ref 26.0–34.0)
MCHC: 32.9 g/dL (ref 30.0–36.0)
MCV: 98.9 fL (ref 78.0–100.0)
MONO ABS: 0.8 10*3/uL (ref 0.1–1.0)
Monocytes Relative: 7 % (ref 3–12)
NEUTROS PCT: 82 % — AB (ref 43–77)
Neutro Abs: 8.4 10*3/uL — ABNORMAL HIGH (ref 1.7–7.7)
Platelets: 199 10*3/uL (ref 150–400)
RBC: 4.61 MIL/uL (ref 3.87–5.11)
RDW: 15.1 % (ref 11.5–15.5)
WBC: 10.3 10*3/uL (ref 4.0–10.5)

## 2014-06-11 LAB — COMPREHENSIVE METABOLIC PANEL
ALT: 40 U/L — AB (ref 0–35)
AST: 27 U/L (ref 0–37)
Albumin: 4 g/dL (ref 3.5–5.2)
Alkaline Phosphatase: 78 U/L (ref 39–117)
Anion gap: 8 (ref 5–15)
BUN: 20 mg/dL (ref 6–23)
CALCIUM: 9.3 mg/dL (ref 8.4–10.5)
CHLORIDE: 103 meq/L (ref 96–112)
CO2: 26 mmol/L (ref 19–32)
CREATININE: 1 mg/dL (ref 0.50–1.10)
GFR calc non Af Amer: 48 mL/min — ABNORMAL LOW (ref >90)
GFR, EST AFRICAN AMERICAN: 56 mL/min — AB (ref >90)
GLUCOSE: 85 mg/dL (ref 70–99)
Potassium: 3.9 mmol/L (ref 3.5–5.1)
Sodium: 137 mmol/L (ref 135–145)
Total Bilirubin: 0.4 mg/dL (ref 0.3–1.2)
Total Protein: 6.6 g/dL (ref 6.0–8.3)

## 2014-06-11 LAB — LACTATE DEHYDROGENASE: LDH: 219 U/L (ref 94–250)

## 2014-06-11 NOTE — Progress Notes (Signed)
LABS FOR CBCD,CMP,LDH,B2M 

## 2014-06-11 NOTE — Assessment & Plan Note (Signed)
Pleasant 81 year old female with a history of Stage IE DLBCL.  She is currently without evidence of recurrence.  Her major concern today is her gradual decline over the last year and inability to ambulate.  She lives in assisted living and is now dependent upon caregivers for all her daily needs. Labs today are at her baseline without significant or concerning change.  The patient is very interested in trying PT, we will see if we can arrange for a PT referral for her given her lack of ambulation and leg weakness. In regards to her NHL, we will see her back in 6 months with repeat labs and physical exam.  She and her daughter were advised to call in the interim with any problems or concerns.

## 2014-06-11 NOTE — Patient Instructions (Signed)
[facility] Discharge Instructions  RECOMMENDATIONS MADE BY THE CONSULTANT AND ANY TEST RESULTS WILL BE SENT TO YOUR REFERRING PHYSICIAN.  Please call with problems or concerns prior to your next scheduled f/u. We will try to see if we can arrange for PT at [facility].  Thank you for choosing [place] to provide your oncology and hematology care.  To afford each patient quality time with our providers, please arrive at least 15 minutes before your scheduled appointment time.  With your help, our goal is to use those 15 minutes to complete the necessary work-up to ensure our physicians have the information they need to help with your evaluation and healthcare recommendations.    Effective January 1st, 2014, we ask that you re-schedule your appointment with our physicians should you arrive 10 or more minutes late for your appointment.  We strive to give you quality time with our providers, and arriving late affects you and other patients whose appointments are after yours.    Again, thank you for choosing [facility].  Our hope is that these requests will decrease the amount of time that you wait before being seen by our physicians.       _____________________________________________________________  Should you have questions after your visit to [facility], please contact our office at [phone] between the hours of 8:30 a.m. and 5:00 p.m.  Voicemails left after 4:30 p.m. will not be returned until the following business day.  For prescription refill requests, have your pharmacy contact our office with your prescription refill request.

## 2014-06-13 LAB — BETA 2 MICROGLOBULIN, SERUM: BETA 2 MICROGLOBULIN: 2.42 mg/L — AB (ref <=2.51)

## 2014-06-14 ENCOUNTER — Telehealth: Payer: Self-pay | Admitting: *Deleted

## 2014-06-14 ENCOUNTER — Ambulatory Visit (INDEPENDENT_AMBULATORY_CARE_PROVIDER_SITE_OTHER): Payer: Medicare Other | Admitting: *Deleted

## 2014-06-14 ENCOUNTER — Telehealth: Payer: Self-pay | Admitting: Cardiology

## 2014-06-14 DIAGNOSIS — I495 Sick sinus syndrome: Secondary | ICD-10-CM

## 2014-06-14 NOTE — Telephone Encounter (Signed)
Daughter walked into office requesting that patient be put back on warfarin due to the cost of eliquis. Daughter said that she can't afford eliquis and rather go back on warfarin. Patient is currently living at the [facility]. Please fax new orders to [facility].

## 2014-06-14 NOTE — Telephone Encounter (Signed)
Spoke with pt and reminded pt of remote transmission that is due today. Pt verbalized understanding.   

## 2014-06-14 NOTE — Telephone Encounter (Signed)
Can we check to see if Xarelto would be affordable? If so, would start 15 mg daily.

## 2014-06-15 NOTE — Progress Notes (Signed)
Remote pacemaker transmission.   

## 2014-06-20 NOTE — Telephone Encounter (Signed)
Daughter has called backed inquiring about her question about her mother starting back on warfarin.   Her Sierra Good needs to know as soon as possible so that they do not have to order her Eliquis again.

## 2014-06-21 ENCOUNTER — Encounter (HOSPITAL_COMMUNITY): Payer: Self-pay | Admitting: Cardiovascular Disease

## 2014-06-21 NOTE — Telephone Encounter (Signed)
Daughter informed to check with insurance to see if xarelto is covered by insurance.

## 2014-07-05 ENCOUNTER — Ambulatory Visit (INDEPENDENT_AMBULATORY_CARE_PROVIDER_SITE_OTHER): Payer: Medicare Other | Admitting: Otolaryngology

## 2014-07-05 ENCOUNTER — Encounter: Payer: Self-pay | Admitting: Cardiology

## 2014-07-05 DIAGNOSIS — C8591 Non-Hodgkin lymphoma, unspecified, lymph nodes of head, face, and neck: Secondary | ICD-10-CM

## 2014-07-12 ENCOUNTER — Encounter: Payer: Self-pay | Admitting: Internal Medicine

## 2014-07-19 ENCOUNTER — Encounter: Payer: Self-pay | Admitting: Cardiology

## 2014-08-27 ENCOUNTER — Ambulatory Visit: Payer: Self-pay | Admitting: *Deleted

## 2014-08-27 DIAGNOSIS — Z5181 Encounter for therapeutic drug level monitoring: Secondary | ICD-10-CM

## 2014-09-17 ENCOUNTER — Telehealth: Payer: Self-pay | Admitting: Cardiology

## 2014-09-17 ENCOUNTER — Encounter: Payer: Medicare Other | Admitting: *Deleted

## 2014-09-17 NOTE — Telephone Encounter (Signed)
Spoke with pt and reminded pt of remote transmission that is due today. Pt verbalized understanding.   

## 2014-09-18 ENCOUNTER — Ambulatory Visit: Payer: Self-pay | Admitting: Cardiovascular Disease

## 2014-09-18 ENCOUNTER — Encounter: Payer: Self-pay | Admitting: Cardiology

## 2014-10-01 ENCOUNTER — Ambulatory Visit (INDEPENDENT_AMBULATORY_CARE_PROVIDER_SITE_OTHER): Payer: Medicare Other | Admitting: *Deleted

## 2014-10-01 DIAGNOSIS — I495 Sick sinus syndrome: Secondary | ICD-10-CM | POA: Diagnosis not present

## 2014-10-01 LAB — MDC_IDC_ENUM_SESS_TYPE_REMOTE
Battery Impedance: 105 Ohm
Battery Voltage: 2.79 V
Brady Statistic AP VP Percent: 37 %
Brady Statistic AP VS Percent: 61 %
Brady Statistic AS VS Percent: 2 %
Lead Channel Pacing Threshold Amplitude: 0.5 V
Lead Channel Pacing Threshold Pulse Width: 0.4 ms
Lead Channel Setting Pacing Amplitude: 2.5 V
Lead Channel Setting Pacing Pulse Width: 0.4 ms
MDC IDC MSMT BATTERY REMAINING LONGEVITY: 124 mo
MDC IDC MSMT LEADCHNL RA IMPEDANCE VALUE: 753 Ohm
MDC IDC MSMT LEADCHNL RV IMPEDANCE VALUE: 462 Ohm
MDC IDC MSMT LEADCHNL RV PACING THRESHOLD AMPLITUDE: 0.625 V
MDC IDC MSMT LEADCHNL RV PACING THRESHOLD PULSEWIDTH: 0.4 ms
MDC IDC MSMT LEADCHNL RV SENSING INTR AMPL: 11.2 mV
MDC IDC SESS DTM: 20160425214536
MDC IDC SET LEADCHNL RA PACING AMPLITUDE: 2 V
MDC IDC SET LEADCHNL RV SENSING SENSITIVITY: 5.6 mV
MDC IDC STAT BRADY AS VP PERCENT: 0 %

## 2014-10-02 ENCOUNTER — Encounter: Payer: Self-pay | Admitting: Cardiovascular Disease

## 2014-10-02 ENCOUNTER — Ambulatory Visit (INDEPENDENT_AMBULATORY_CARE_PROVIDER_SITE_OTHER): Payer: Medicare Other | Admitting: Cardiovascular Disease

## 2014-10-02 ENCOUNTER — Encounter: Payer: Self-pay | Admitting: *Deleted

## 2014-10-02 VITALS — BP 120/60 | HR 61 | Ht 67.0 in | Wt 147.0 lb

## 2014-10-02 DIAGNOSIS — Z950 Presence of cardiac pacemaker: Secondary | ICD-10-CM | POA: Diagnosis not present

## 2014-10-02 DIAGNOSIS — R5383 Other fatigue: Secondary | ICD-10-CM

## 2014-10-02 DIAGNOSIS — I10 Essential (primary) hypertension: Secondary | ICD-10-CM | POA: Diagnosis not present

## 2014-10-02 DIAGNOSIS — I4891 Unspecified atrial fibrillation: Secondary | ICD-10-CM

## 2014-10-02 DIAGNOSIS — I495 Sick sinus syndrome: Secondary | ICD-10-CM

## 2014-10-02 NOTE — Progress Notes (Signed)
Patient ID: Sierra Good, female   DOB: 1923/01/11, 81 y.o.   MRN: [id number]      SUBJECTIVE: The patient is here to followup for management of atrial fibrillation. She is currently taking amiodarone 200 mg twice daily and Eliquis 5 mg twice daily for anticoagulation. In spite of the discontinuation of 3 medications at her last visit with me, she remains fatigued. She is here with her daughter, [name]. She also has a history of diffuse large B-cell lymphoma and is followed by Dr. Whitney Muse. She resides in assisted living.   Review of Systems: As per "subjective", otherwise negative.  Allergies  Allergen Reactions  . Statins Other (See Comments)    Causes leg cramps and muscle weakness    Current Outpatient Prescriptions  Medication Sig Dispense Refill  . acetaminophen (TYLENOL) 500 MG tablet Take 500 mg by mouth every 8 (eight) hours as needed.    Marland Kitchen amiodarone (PACERONE) 200 MG tablet Take 1 tablet (200 mg total) by mouth daily.    Marland Kitchen apixaban (ELIQUIS) 5 MG TABS tablet Take 1 tablet (5 mg total) by mouth 2 (two) times daily. 60 tablet 3  . Calcium-Vitamin D (CALTRATE 600 PLUS-VIT D PO) Take 1 tablet by mouth daily.    . Cholecalciferol (VITAMIN D3) 3000 UNITS TABS Take 1,000 Units by mouth daily.    . clobetasol cream (TEMOVATE) 1.28 % Apply 1 application topically 2 (two) times daily.     Marland Kitchen dexlansoprazole (DEXILANT) 60 MG capsule Take 60 mg by mouth daily.    Marland Kitchen diltiazem (CARDIZEM CD) 120 MG 24 hr capsule Take 1 capsule (120 mg total) by mouth daily. 60 capsule 0  . guaiFENesin (MUCINEX) 600 MG 12 hr tablet Take by mouth 2 (two) times daily as needed.    Marland Kitchen guaifenesin (ROBITUSSIN) 100 MG/5ML syrup Take 200 mg by mouth 3 (three) times daily as needed for cough.    . lactose free nutrition (BOOST PLUS) LIQD Take 237 mLs by mouth 3 (three) times daily with meals. 30 Can 0  . Multiple Vitamins-Minerals (CENTRUM SILVER ULTRA WOMENS) TABS Take 1 tablet by mouth daily.      .  ondansetron (ZOFRAN) 4 MG tablet Take 4 mg by mouth every 6 (six) hours as needed for nausea or vomiting.    Marland Kitchen oxybutynin (DITROPAN) 5 MG tablet Take 5 mg by mouth daily.      No current facility-administered medications for this visit.   Facility-Administered Medications Ordered in Other Visits  Medication Dose Route Frequency Provider Last Rate Last Dose  . heparin lock flush 100 unit/mL  500 Units Intravenous Once Baird Cancer, PA-C      . sodium chloride 0.9 % injection 10 mL  10 mL Intravenous PRN Baird Cancer, PA-C        Past Medical History  Diagnosis Date  . GERD (gastroesophageal reflux disease)        . Hyperlipidemia   . Hypertension   . Osteoarthritis of lumbar spine   . PONV (postoperative nausea and vomiting) 2009    after total hip  . Deviated nasal septum     pt had septoplasty  . Nasal turbinate hypertrophy     bilateral  . Cancer     nasal cancer  . Cancer     cancer in the nose and in lower jaw  . Atrial fibrillation   . Dizziness   . Itching   . Diffuse large B cell lymphoma     Dr.  Neijstrom -Nasal cavity, right sided lymph adenopathy  . OAB (overactive bladder)   . 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 Total hip arthroplasty  2009    Right, [facility] (general)  . Bunionectomy  20+yrs ago    bilateral, [facility]  . Hammer toe surgery      20+ yrs ago  . Cataract extraction w/ intraocular lens  implant, bilateral  2010    [facility]  . Nasal septoplasty w/ turbinoplasty  12/18/2010    Procedure: NASAL SEPTOPLASTY WITH TURBINATE REDUCTION;  Surgeon: Ascencion Dike;  Location: [facility];  Service: ENT;  Laterality: Bilateral;  . Colon surgery  10/29/2008    Ingram-right hemicolectomy for tubulovillous adenoma (3cm) with HG glandular dysplasia  . Portacath placement  02/10/2011    Procedure: INSERTION PORT-A-CATH;  Surgeon: Scherry Ran;  Location: [facility];  Service: General;  Laterality:  Left;  left subclavian  . Skin cancer removal  6/13  . Colonoscopy  09/28/2002    RMR: Normal colonic mucosa.  Redundant and elongated but otherwise normal appearing colon/normal rectum  . Colonoscopy  09/11/2008    RMR: Normal rectum/Diminutive sigmoid polyp status post snare removal/  Flat sessile semilunar lesion at and in the ileocecal valve status post hot snare piecemeal debulking as described above  . Colonoscopy  01/06/10    Rourk-normal rectum, normal residual colon status post right hemicolectomy. Next colonoscopy in 01/2015 health permiting  . Esophagogastroduodenoscopy (egd) with esophageal dilation  05/23/2012    [id number] ring-dilated as described above. Hiatal hernia  . Esophagogastroduodenoscopy Left 03/06/2013    Procedure: ESOPHAGOGASTRODUODENOSCOPY (EGD);  Surgeon: Jerene Bears, MD;  Location: [place];  Service: Gastroenterology;  Laterality: Left;  . Pacemaker insertion Right 02/16/2013  . Port-a-cath removal Left 02/16/2013  . Permanent pacemaker insertion N/A 02/16/2013    Procedure: PERMANENT PACEMAKER INSERTION;  Surgeon: Evans Lance, MD;  Location: [facility] CATH LAB;  Service: Cardiovascular;  Laterality: N/A;    History   Social History  . Marital Status: Widowed    Spouse Name: N/A  . Number of Children: N/A  . Years of Education: N/A   Occupational History  . Not on file.   Social History Main Topics  . Smoking status: Never Smoker   . Smokeless tobacco: Never Used  . Alcohol Use: No  . Drug Use: No  . Sexual Activity: Yes    Birth Control/ Protection: Post-menopausal   Other Topics Concern  . Not on file   Social History Narrative     Filed Vitals:   10/02/14 1143  BP: 120/60  Pulse: 61  Height: 5\' 7"  (1.702 m)  Weight: 147 lb (66.679 kg)  SpO2: 98%    PHYSICAL EXAM General: NAD, elderly, frail. HEENT: Normal. Neck: No JVD, no thyromegaly. Lungs: Clear to auscultation bilaterally with normal respiratory effort. CV: Regular rate  and rhythm, normal S1/S2, no S3/S4, no murmur. No pretibial or periankle edema.   Abdomen: Soft, nontender, no distention.  Neurologic: Alert and oriented.  Psych: Normal affect. Skin: Normal. Musculoskeletal: No gross deformities. Extremities: No clubbing or cyanosis.   ECG: Most recent ECG reviewed.      ASSESSMENT AND PLAN: 1. Atrial fibrillation: Symptomatically stable. Continue amiodarone and Eliquis. 2. Pacemaker: Device interrogation yesterday, not uploaded into system yet. 3. Essential HTN: Well controlled for age on current therapy. No changes.  Dispo: f/u 6 months.   Kate Sable,  M.D., F.A.C.C.

## 2014-10-02 NOTE — Patient Instructions (Signed)
Your physician wants you to follow-up in: 6 months with Dr. Koneswaran. You will receive a reminder letter in the mail two months in advance. If you don't receive a letter, please call our office to schedule the follow-up appointment.  Your physician recommends that you continue on your current medications as directed. Please refer to the Current Medication list given to you today.  Thank you for choosing [facility]!   

## 2014-10-03 NOTE — Progress Notes (Signed)
Remote pacemaker transmission.   

## 2014-10-04 ENCOUNTER — Other Ambulatory Visit: Payer: Self-pay | Admitting: Internal Medicine

## 2014-10-18 ENCOUNTER — Encounter: Payer: Self-pay | Admitting: Cardiology

## 2014-10-24 ENCOUNTER — Encounter: Payer: Self-pay | Admitting: Internal Medicine

## 2014-11-01 ENCOUNTER — Encounter: Payer: Self-pay | Admitting: Cardiology

## 2014-11-26 ENCOUNTER — Other Ambulatory Visit (HOSPITAL_COMMUNITY): Payer: Self-pay

## 2014-12-12 ENCOUNTER — Other Ambulatory Visit (HOSPITAL_COMMUNITY): Payer: Self-pay

## 2014-12-12 ENCOUNTER — Ambulatory Visit (HOSPITAL_COMMUNITY): Payer: Self-pay | Admitting: Hematology & Oncology

## 2014-12-24 ENCOUNTER — Encounter: Payer: Self-pay | Admitting: Internal Medicine

## 2014-12-27 ENCOUNTER — Encounter (HOSPITAL_COMMUNITY): Payer: Medicare Other | Attending: Hematology & Oncology | Admitting: Hematology & Oncology

## 2014-12-27 ENCOUNTER — Encounter (HOSPITAL_COMMUNITY): Payer: Self-pay | Admitting: Hematology & Oncology

## 2014-12-27 ENCOUNTER — Encounter (HOSPITAL_BASED_OUTPATIENT_CLINIC_OR_DEPARTMENT_OTHER): Payer: Medicare Other

## 2014-12-27 VITALS — BP 163/71 | HR 68 | Temp 97.5°F | Resp 20 | Wt 139.1 lb

## 2014-12-27 DIAGNOSIS — C8331 Diffuse large B-cell lymphoma, lymph nodes of head, face, and neck: Secondary | ICD-10-CM

## 2014-12-27 DIAGNOSIS — C8591 Non-Hodgkin lymphoma, unspecified, lymph nodes of head, face, and neck: Secondary | ICD-10-CM | POA: Diagnosis not present

## 2014-12-27 DIAGNOSIS — R945 Abnormal results of liver function studies: Secondary | ICD-10-CM

## 2014-12-27 DIAGNOSIS — R7989 Other specified abnormal findings of blood chemistry: Secondary | ICD-10-CM | POA: Diagnosis not present

## 2014-12-27 LAB — CBC WITH DIFFERENTIAL/PLATELET
BASOS ABS: 0 10*3/uL (ref 0.0–0.1)
BASOS PCT: 0 % (ref 0–1)
EOS ABS: 0.2 10*3/uL (ref 0.0–0.7)
Eosinophils Relative: 3 % (ref 0–5)
HEMATOCRIT: 43.3 % (ref 36.0–46.0)
Hemoglobin: 14.3 g/dL (ref 12.0–15.0)
LYMPHS PCT: 12 % (ref 12–46)
Lymphs Abs: 1 10*3/uL (ref 0.7–4.0)
MCH: 31.6 pg (ref 26.0–34.0)
MCHC: 33 g/dL (ref 30.0–36.0)
MCV: 95.6 fL (ref 78.0–100.0)
MONO ABS: 0.7 10*3/uL (ref 0.1–1.0)
Monocytes Relative: 8 % (ref 3–12)
NEUTROS PCT: 77 % (ref 43–77)
Neutro Abs: 6.3 10*3/uL (ref 1.7–7.7)
PLATELETS: 210 10*3/uL (ref 150–400)
RBC: 4.53 MIL/uL (ref 3.87–5.11)
RDW: 15.1 % (ref 11.5–15.5)
WBC: 8.1 10*3/uL (ref 4.0–10.5)

## 2014-12-27 LAB — COMPREHENSIVE METABOLIC PANEL
ALBUMIN: 4 g/dL (ref 3.5–5.0)
ALK PHOS: 100 U/L (ref 38–126)
ALT: 73 U/L — ABNORMAL HIGH (ref 14–54)
AST: 61 U/L — ABNORMAL HIGH (ref 15–41)
Anion gap: 11 (ref 5–15)
BUN: 18 mg/dL (ref 6–20)
CALCIUM: 9.4 mg/dL (ref 8.9–10.3)
CO2: 25 mmol/L (ref 22–32)
Chloride: 102 mmol/L (ref 101–111)
Creatinine, Ser: 0.89 mg/dL (ref 0.44–1.00)
GFR calc Af Amer: >60 mL/min (ref >60)
GFR calc non Af Amer: 55 mL/min — ABNORMAL LOW (ref >60)
Glucose, Bld: 80 mg/dL (ref 65–99)
POTASSIUM: 3.9 mmol/L (ref 3.5–5.1)
SODIUM: 138 mmol/L (ref 135–145)
TOTAL PROTEIN: 7 g/dL (ref 6.5–8.1)
Total Bilirubin: 0.5 mg/dL (ref 0.3–1.2)

## 2014-12-27 LAB — LACTATE DEHYDROGENASE: LDH: 206 U/L — ABNORMAL HIGH (ref 98–192)

## 2014-12-27 NOTE — Progress Notes (Signed)
VYAS,DHRUV B., MD [place] [id number]  Diffuse large B-cell lymphoma, stage I AE vs  II-E, status post 4 cycles of CVP-R. followed by consolidative radiotherapy with primary disease presenting in the nasal cavity and neck. Last chemotherapy treatment was 04/13/2011.   Last PET/CT 09/2011 with no residual or recurrent hypermetabolic adenopathy. Focus within anterior L fifth rib, favored to be interval fracture  Afib on Eliquis  CURRENT THERAPY: Observation  INTERVAL HISTORY: Sierra Good 81 y.o. female returns for follow-up of her lymphoma.  She can no longer walk.  At assisted living she is now completely dependent on them for all care.  She finds this very difficult. She did better when she was in skilled care a year ago.  Over the past year in assisted living she has gradually declined. She resides at [facility] living.  The patient says that her progress has been "no better, no worse" but overall she is doing well.  She is in a wheelchair and uses a walker.  She has however experienced falls at home that occur when she tries to transport without assistance.  Her last fall was yesterday where she slid into the floor while trying to get from her recliner to her walker.   She has recently been dealing with skin cancer removals on her arms and face.  She saw her cardiologist in April. The patient's appetite is well and she has bowel movements every other day.  She denies nausea.She loves to read and do puzzles.    MEDICAL HISTORY: Past Medical History  Diagnosis Date  . GERD (gastroesophageal reflux disease)        . Hyperlipidemia   . Hypertension   . Osteoarthritis of lumbar spine   . PONV (postoperative nausea and vomiting) 2009    after total hip  . Deviated nasal septum     pt had septoplasty  . Nasal turbinate hypertrophy     bilateral  . Cancer     nasal cancer  . Cancer     cancer in the nose and in lower jaw  . Atrial fibrillation   .  Dizziness   . Itching   . Diffuse large B cell lymphoma     Dr. Tressie Stalker -Nasal cavity, right sided lymph adenopathy  . OAB (overactive bladder)   . Tubulovillous adenoma of colon 10/2008    w/ HG dysplasia    has HYPERLIPIDEMIA; HYPERTENSION; Atrial fibrillation; BRADYCARDIA; SYNCOPE, HX OF; Lymphoma of lymph nodes of head, face, and/or neck; Nausea alone; SOB (shortness of breath) on exertion; Dysphagia; GERD (gastroesophageal reflux disease); Long term (current) use of anticoagulants; Muscle weakness (generalized); Difficulty in walking(719.7); Pacemaker; and Encounter for therapeutic drug monitoring on her problem list.     No history exists.     is allergic to statins.  Sierra Good had no medications administered during this visit.  SURGICAL HISTORY: Past Surgical History  Procedure Laterality Date  . Total hip arthroplasty  2009    Right, [facility] (general)  . Bunionectomy  20+yrs ago    bilateral, [facility]  . Hammer toe surgery      20+ yrs ago  . Cataract extraction w/ intraocular lens  implant, bilateral  2010    [facility]  . Nasal septoplasty w/ turbinoplasty  12/18/2010    Procedure: NASAL SEPTOPLASTY WITH TURBINATE REDUCTION;  Surgeon: Ascencion Dike;  Location: [facility];  Service: ENT;  Laterality: Bilateral;  . Colon surgery  10/29/2008  Ingram-right hemicolectomy for tubulovillous adenoma (3cm) with HG glandular dysplasia  . Portacath placement  02/10/2011    Procedure: INSERTION PORT-A-CATH;  Surgeon: Scherry Ran;  Location: [facility];  Service: General;  Laterality: Left;  left subclavian  . Skin cancer removal  6/13  . Colonoscopy  09/28/2002    RMR: Normal colonic mucosa.  Redundant and elongated but otherwise normal appearing colon/normal rectum  . Colonoscopy  09/11/2008    RMR: Normal rectum/Diminutive sigmoid polyp status post snare removal/  Flat sessile semilunar lesion at and in the ileocecal valve status post hot snare piecemeal debulking as described above  .  Colonoscopy  01/06/10    Rourk-normal rectum, normal residual colon status post right hemicolectomy. Next colonoscopy in 01/2015 health permiting  . Esophagogastroduodenoscopy (egd) with esophageal dilation  05/23/2012    [id number] ring-dilated as described above. Hiatal hernia  . Esophagogastroduodenoscopy Left 03/06/2013    Procedure: ESOPHAGOGASTRODUODENOSCOPY (EGD);  Surgeon: Jerene Bears, MD;  Location: [place];  Service: Gastroenterology;  Laterality: Left;  . Pacemaker insertion Right 02/16/2013  . Port-a-cath removal Left 02/16/2013  . Permanent pacemaker insertion N/A 02/16/2013    Procedure: PERMANENT PACEMAKER INSERTION;  Surgeon: Evans Lance, MD;  Location: [facility] CATH LAB;  Service: Cardiovascular;  Laterality: N/A;    SOCIAL HISTORY: History   Social History  . Marital Status: Widowed    Spouse Name: N/A  . Number of Children: N/A  . Years of Education: N/A   Occupational History  . Not on file.   Social History Main Topics  . Smoking status: Never Smoker   . Smokeless tobacco: Never Used  . Alcohol Use: No  . Drug Use: No  . Sexual Activity: Yes    Birth Control/ Protection: Post-menopausal   Other Topics Concern  . Not on file   Social History Narrative    FAMILY HISTORY: Family History  Problem Relation Age of Onset  . Pseudochol deficiency Neg Hx   . Malignant hyperthermia Neg Hx   . Hypotension Neg Hx   . Anesthesia problems Neg Hx   . Cancer Mother   . Stroke Father     Review of Systems  Constitutional: Negative for fever, chills, weight loss and malaise/fatigue.  HENT: Negative for congestion, hearing loss, nosebleeds, sore throat and tinnitus.   Eyes: Negative for blurred vision, double vision, pain and discharge.  Respiratory: Negative for cough, hemoptysis, sputum production, shortness of breath and wheezing.   Cardiovascular: Negative for chest pain, palpitations, claudication, leg swelling and PND.  Gastrointestinal: Negative for  heartburn, nausea, vomiting, abdominal pain, diarrhea, constipation, blood in stool and melena.  Genitourinary: Positive for urgency and frequency. Negative for dysuria and hematuria.  Musculoskeletal: Positive for falls. Negative for myalgias and joint pain.       Unable to ambulate. Pain in R leg. Can no longer use her walker.  Skin: Negative for itching and rash.  Neurological: Negative for dizziness, tingling, tremors, sensory change, speech change, focal weakness, seizures, loss of consciousness, weakness and headaches.  Endo/Heme/Allergies: Does not bruise/bleed easily.  Psychiatric/Behavioral: Negative for depression, suicidal ideas, memory loss and substance abuse. The patient is not nervous/anxious and does not have insomnia.   14 point review of systems was performed and is negative except as detailed under history of present illness and above   PHYSICAL EXAMINATION  ECOG PERFORMANCE STATUS: 2 - Symptomatic, <50% confined to bed  Note, not from her malignancy.   Filed Vitals:   12/27/14 1000  BP:  163/71  Pulse: 68  Temp: 97.5 F (36.4 C)  Resp: 20    Physical Exam  Constitutional: She is oriented to person, place, and time.  In wheelchair, pleasant, well groomed.  She is wearing support stockings.  HENT:  Head: Normocephalic and atraumatic.  Mouth/Throat: No oropharyngeal exudate.  Eyes: Conjunctivae and EOM are normal. Right eye exhibits no discharge. No scleral icterus.  Neck: Normal range of motion. Neck supple. No thyromegaly present.  Cardiovascular: Normal heart sounds.   Rate controlled, irreg irreg  Pulmonary/Chest: Effort normal and breath sounds normal. No respiratory distress. She has no wheezes.  Abdominal: Soft. Bowel sounds are normal. She exhibits no distension. There is no tenderness. There is no rebound and no guarding.  Musculoskeletal: Normal range of motion. She exhibits no edema or tenderness.  Cannot stand  Lymphadenopathy:    She has no cervical  adenopathy.  No appreciable adenopathy  Neurological: She is alert and oriented to person, place, and time. No cranial nerve deficit.  Skin: Skin is warm and dry.  Psychiatric: Mood, memory, affect and judgment normal.    LABORATORY DATA:  CBC    Component Value Date/Time   WBC 8.1 12/27/2014 1015   RBC 4.53 12/27/2014 1015   RBC 4.53 05/18/2013 0944   HGB 14.3 12/27/2014 1015   HCT 43.3 12/27/2014 1015   HCT 47 03/21/2012 0826   PLT 210 12/27/2014 1015   MCV 95.6 12/27/2014 1015   MCH 31.6 12/27/2014 1015   MCHC 33.0 12/27/2014 1015   RDW 15.1 12/27/2014 1015   LYMPHSABS 1.0 12/27/2014 1015   MONOABS 0.7 12/27/2014 1015   EOSABS 0.2 12/27/2014 1015   BASOSABS 0.0 12/27/2014 1015   CMP     Component Value Date/Time   NA 138 12/27/2014 1015   NA 141 03/21/2012 0828   K 3.9 12/27/2014 1015   K 4.0 03/21/2012 0828   CL 102 12/27/2014 1015   CL 100 03/21/2012 0828   CO2 25 12/27/2014 1015   GLUCOSE 80 12/27/2014 1015   BUN 18 12/27/2014 1015   BUN 14 03/21/2012 0828   CREATININE 0.89 12/27/2014 1015   CREATININE 0.87 03/21/2012 0828   CALCIUM 9.4 12/27/2014 1015   CALCIUM 10.4 03/21/2012 0828   PROT 7.0 12/27/2014 1015   PROT 6.6 03/21/2012 0828   ALBUMIN 4.0 12/27/2014 1015   AST 61* 12/27/2014 1015   AST 17 03/21/2012 0828   ALT 73* 12/27/2014 1015   ALKPHOS 100 12/27/2014 1015   ALKPHOS 100 03/21/2012 0828   BILITOT 0.5 12/27/2014 1015   BILITOT 0.3 03/21/2012 0828   GFRNONAA 55* 12/27/2014 1015   GFRAA >60 12/27/2014 1015    ASSESSMENT and THERAPY PLAN:  Diffuse large B-cell lymphoma, stage I AE vs  II-E, status post 4 cycles of CVP-R. followed by consolidative radiotherapy with primary disease presenting in the nasal cavity and neck. Last chemotherapy treatment was 04/13/2011.  Transaminitis  Realistically she is at her baseline. She has elevation in her LFTs today and I have recommended repeating them in 1 month. She has not been on any recent  anti-biotics or new medications.  From the perspective of her lymphoma she does not have obvious evidence of recurrence and we will recommend ongoing 6 month follow-up. We will keep her apprised of the results of her liver functions when a come back in 4 weeks, if they remain persistently elevated or are any higher we will make additional recommendations at that point.  Orders Placed This Encounter  Procedures  . Comprehensive metabolic panel    Standing Status: Future     Number of Occurrences:      Standing Expiration Date: 12/27/2015  . CBC with Differential    Standing Status: Future     Number of Occurrences:      Standing Expiration Date: 12/27/2015  . Comprehensive metabolic panel    Standing Status: Future     Number of Occurrences:      Standing Expiration Date: 12/27/2015  . Lactate dehydrogenase    Standing Status: Future     Number of Occurrences:      Standing Expiration Date: 12/27/2015    All questions were answered. The patient knows to call the clinic with any problems, questions or concerns. We can certainly see the patient much sooner if necessary.   This document serves as a record of services personally performed by Ancil Linsey, MD. It was created on her behalf by Janace Hoard, a trained medical scribe. The creation of this record is based on the scribe's personal observations and the provider's statements to them. This document has been checked and approved by the attending provider.  I have reviewed the above documentation for accuracy and completeness, and I agree with the above.  This note was electronically signed.  Kelby Fam. Whitney Muse, MD

## 2014-12-27 NOTE — Progress Notes (Signed)
LABS DRAWN

## 2014-12-27 NOTE — Patient Instructions (Signed)
[place] at [facility] Discharge Instructions  RECOMMENDATIONS MADE BY THE CONSULTANT AND ANY TEST RESULTS WILL BE SENT TO YOUR REFERRING PHYSICIAN.  Exam completed by Dr Whitney Muse today Lab work re-check in 1 month Return in 6 months to see the doctor and to have lab work Please call the clinic if you have any questions or concerns  Thank you for choosing [place] at [facility] to provide your oncology and hematology care.  To afford each patient quality time with our provider, please arrive at least 15 minutes before your scheduled appointment time.    You need to re-schedule your appointment should you arrive 10 or more minutes late.  We strive to give you quality time with our providers, and arriving late affects you and other patients whose appointments are after yours.  Also, if you no show three or more times for appointments you may be dismissed from the clinic at the providers discretion.     Again, thank you for choosing [facility].  Our hope is that these requests will decrease the amount of time that you wait before being seen by our physicians.       _____________________________________________________________  Should you have questions after your visit to [facility], please contact our office at [phone] between the hours of 8:30 a.m. and 4:30 p.m.  Voicemails left after 4:30 p.m. will not be returned until the following business day.  For prescription refill requests, have your pharmacy contact our office.

## 2015-01-01 ENCOUNTER — Encounter: Payer: Self-pay | Admitting: *Deleted

## 2015-01-29 ENCOUNTER — Encounter (HOSPITAL_COMMUNITY): Payer: Medicare Other | Attending: Hematology & Oncology

## 2015-01-29 DIAGNOSIS — R7989 Other specified abnormal findings of blood chemistry: Secondary | ICD-10-CM | POA: Insufficient documentation

## 2015-01-29 DIAGNOSIS — C8591 Non-Hodgkin lymphoma, unspecified, lymph nodes of head, face, and neck: Secondary | ICD-10-CM | POA: Diagnosis not present

## 2015-01-29 DIAGNOSIS — R945 Abnormal results of liver function studies: Secondary | ICD-10-CM

## 2015-01-29 DIAGNOSIS — C8331 Diffuse large B-cell lymphoma, lymph nodes of head, face, and neck: Secondary | ICD-10-CM | POA: Diagnosis not present

## 2015-01-29 LAB — COMPREHENSIVE METABOLIC PANEL
ALK PHOS: 92 U/L (ref 38–126)
ALT: 76 U/L — ABNORMAL HIGH (ref 14–54)
AST: 63 U/L — ABNORMAL HIGH (ref 15–41)
Albumin: 3.9 g/dL (ref 3.5–5.0)
Anion gap: 9 (ref 5–15)
BUN: 20 mg/dL (ref 6–20)
CALCIUM: 9.1 mg/dL (ref 8.9–10.3)
CO2: 26 mmol/L (ref 22–32)
Chloride: 100 mmol/L — ABNORMAL LOW (ref 101–111)
Creatinine, Ser: 0.93 mg/dL (ref 0.44–1.00)
GFR, EST NON AFRICAN AMERICAN: 53 mL/min — AB (ref >60)
Glucose, Bld: 90 mg/dL (ref 65–99)
Potassium: 4.6 mmol/L (ref 3.5–5.1)
Sodium: 135 mmol/L (ref 135–145)
Total Bilirubin: 0.5 mg/dL (ref 0.3–1.2)
Total Protein: 6.9 g/dL (ref 6.5–8.1)

## 2015-01-29 NOTE — Progress Notes (Signed)
Labs drawn

## 2015-02-07 ENCOUNTER — Encounter: Payer: Self-pay | Admitting: Internal Medicine

## 2015-02-07 ENCOUNTER — Ambulatory Visit (INDEPENDENT_AMBULATORY_CARE_PROVIDER_SITE_OTHER): Payer: Medicare Other | Admitting: Internal Medicine

## 2015-02-07 VITALS — BP 104/78 | HR 64 | Ht 67.0 in | Wt 149.0 lb

## 2015-02-07 DIAGNOSIS — I10 Essential (primary) hypertension: Secondary | ICD-10-CM | POA: Diagnosis not present

## 2015-02-07 DIAGNOSIS — I480 Paroxysmal atrial fibrillation: Secondary | ICD-10-CM

## 2015-02-07 DIAGNOSIS — Z950 Presence of cardiac pacemaker: Secondary | ICD-10-CM

## 2015-02-07 LAB — CUP PACEART INCLINIC DEVICE CHECK
Battery Remaining Longevity: 117 mo
Brady Statistic AP VP Percent: 41 %
Brady Statistic AP VS Percent: 57 %
Brady Statistic AS VS Percent: 2 %
Lead Channel Impedance Value: 441 Ohm
Lead Channel Impedance Value: 711 Ohm
Lead Channel Pacing Threshold Amplitude: 0.75 V
Lead Channel Pacing Threshold Pulse Width: 0.4 ms
Lead Channel Sensing Intrinsic Amplitude: 15.67 mV
Lead Channel Setting Pacing Amplitude: 2 V
Lead Channel Setting Pacing Amplitude: 2.5 V
Lead Channel Setting Sensing Sensitivity: 5.6 mV
MDC IDC MSMT BATTERY IMPEDANCE: 130 Ohm
MDC IDC MSMT BATTERY VOLTAGE: 2.79 V
MDC IDC MSMT LEADCHNL RA PACING THRESHOLD AMPLITUDE: 0.5 V
MDC IDC MSMT LEADCHNL RA PACING THRESHOLD PULSEWIDTH: 0.4 ms
MDC IDC SESS DTM: 20160901145310
MDC IDC SET LEADCHNL RV PACING PULSEWIDTH: 0.4 ms
MDC IDC STAT BRADY AS VP PERCENT: 0 %

## 2015-02-07 NOTE — Assessment & Plan Note (Signed)
Her medtronic DDD PM is working normally. Will follow.

## 2015-02-07 NOTE — Assessment & Plan Note (Signed)
Her blood pressure is well controlled. She will continue her current meds except I have asked that she stop taking cardizem as this could be contributing to her fatigue.

## 2015-02-07 NOTE — Assessment & Plan Note (Signed)
She is mostly maintaining NSR on amiodarone. She is currently on 200 mg daily. Will follow.

## 2015-02-07 NOTE — Patient Instructions (Addendum)
Your physician wants you to follow-up in: 12 months with Dr. Lovena Le. You will receive a reminder letter in the mail two months in advance. If you don't receive a letter, please call our office to schedule the follow-up appointment.  Remote monitoring is used to monitor your Pacemaker of ICD from home. This monitoring reduces the number of office visits required to check your device to one time per year. It allows [place] to keep an eye on the functioning of your device to ensure it is working properly. You are scheduled for a device check from home on 05/09/15. You may send your transmission at any time that day. If you have a wireless device, the transmission will be sent automatically. After your physician reviews your transmission, you will receive a postcard with your next transmission date.  Your physician has recommended you make the following change in your medication:  Stop taking Cardizem  Your physician recommends that you continue on your current medications as directed. Please refer to the Current Medication list given to you today.  Thank you for choosing [facility]!

## 2015-02-07 NOTE — Progress Notes (Signed)
HPI Sierra Good returns today for followup. She is a pleasant 82 yo woman with a h/o symptomatic bradycardia, PAF with a RVR, s/p PPM. No syncope. She was cardioverted 18 months ago and returned to NSR but amio was discontinued and she reverted to atrial fib. When I saw her last she felt poorly. She has been restarted on amiodarone. She has generalized fatigue. No specific complaint. She has complete chemo for her lymphoma. Allergies  Allergen Reactions  . Statins Other (See Comments)    Causes leg cramps and muscle weakness     Current Outpatient Prescriptions  Medication Sig Dispense Refill  . acetaminophen (TYLENOL) 500 MG tablet Take 500 mg by mouth every 8 (eight) hours as needed.    Marland Kitchen amiodarone (PACERONE) 200 MG tablet Take 1 tablet (200 mg total) by mouth daily.    Marland Kitchen apixaban (ELIQUIS) 5 MG TABS tablet Take 1 tablet (5 mg total) by mouth 2 (two) times daily. 60 tablet 3  . Calcium-Vitamin D (CALTRATE 600 PLUS-VIT D PO) Take 1 tablet by mouth daily.    . Cholecalciferol (VITAMIN D3) 3000 UNITS TABS Take 1,000 Units by mouth daily.    Marland Kitchen dexlansoprazole (DEXILANT) 60 MG capsule Take 60 mg by mouth daily.    Marland Kitchen diltiazem (CARDIZEM CD) 120 MG 24 hr capsule Take 1 capsule (120 mg total) by mouth daily. 60 capsule 0  . guaiFENesin (MUCINEX) 600 MG 12 hr tablet Take by mouth 2 (two) times daily as needed.    Marland Kitchen guaifenesin (ROBITUSSIN) 100 MG/5ML syrup Take 200 mg by mouth 3 (three) times daily as needed for cough.    . lactose free nutrition (BOOST PLUS) LIQD Take 237 mLs by mouth 3 (three) times daily with meals. 30 Can 0  . Multiple Vitamins-Minerals (CENTRUM SILVER ULTRA WOMENS) TABS Take 1 tablet by mouth daily.      . ondansetron (ZOFRAN) 4 MG tablet Take 4 mg by mouth every 6 (six) hours as needed for nausea or vomiting.    Marland Kitchen oxybutynin (DITROPAN) 5 MG tablet Take 5 mg by mouth daily.     . polyethylene glycol (MIRALAX / GLYCOLAX) packet Take 17 g by mouth daily as needed.      No current facility-administered medications for this visit.   Facility-Administered Medications Ordered in Other Visits  Medication Dose Route Frequency Provider Last Rate Last Dose  . heparin lock flush 100 unit/mL  500 Units Intravenous Once Baird Cancer, PA-C      . sodium chloride 0.9 % injection 10 mL  10 mL Intravenous PRN Baird Cancer, PA-C         Past Medical History  Diagnosis Date  . GERD (gastroesophageal reflux disease)        . Hyperlipidemia   . Hypertension   . Osteoarthritis of lumbar spine   . PONV (postoperative nausea and vomiting) 2009    after total hip  . Deviated nasal septum     pt had septoplasty  . Nasal turbinate hypertrophy     bilateral  . Cancer     nasal cancer  . Cancer     cancer in the nose and in lower jaw  . Atrial fibrillation   . Dizziness   . Itching   . Diffuse large B cell lymphoma     Dr. Tressie Stalker -Nasal cavity, right sided lymph adenopathy  . OAB (overactive bladder)   . Tubulovillous adenoma of colon 10/2008    w/ HG dysplasia  ROS:   All systems reviewed and negative except as noted in the HPI.   Past Surgical History  Procedure Laterality Date  . Total hip arthroplasty  2009    Right, [facility] (general)  . Bunionectomy  20+yrs ago    bilateral, [facility]  . Hammer toe surgery      20+ yrs ago  . Cataract extraction w/ intraocular lens  implant, bilateral  2010    [facility]  . Nasal septoplasty w/ turbinoplasty  12/18/2010    Procedure: NASAL SEPTOPLASTY WITH TURBINATE REDUCTION;  Surgeon: Ascencion Dike;  Location: [facility];  Service: ENT;  Laterality: Bilateral;  . Colon surgery  10/29/2008    Ingram-right hemicolectomy for tubulovillous adenoma (3cm) with HG glandular dysplasia  . Portacath placement  02/10/2011    Procedure: INSERTION PORT-A-CATH;  Surgeon: Scherry Ran;  Location: [facility];  Service: General;  Laterality: Left;  left subclavian  . Skin cancer removal  6/13  . Colonoscopy  09/28/2002    RMR: Normal  colonic mucosa.  Redundant and elongated but otherwise normal appearing colon/normal rectum  . Colonoscopy  09/11/2008    RMR: Normal rectum/Diminutive sigmoid polyp status post snare removal/  Flat sessile semilunar lesion at and in the ileocecal valve status post hot snare piecemeal debulking as described above  . Colonoscopy  01/06/10    Rourk-normal rectum, normal residual colon status post right hemicolectomy. Next colonoscopy in 01/2015 health permiting  . Esophagogastroduodenoscopy (egd) with esophageal dilation  05/23/2012    [id number] ring-dilated as described above. Hiatal hernia  . Esophagogastroduodenoscopy Left 03/06/2013    Procedure: ESOPHAGOGASTRODUODENOSCOPY (EGD);  Surgeon: Jerene Bears, MD;  Location: [facility];  Service: Gastroenterology;  Laterality: Left;  . Pacemaker insertion Right 02/16/2013  . Port-a-cath removal Left 02/16/2013  . Permanent pacemaker insertion N/A 02/16/2013    Procedure: PERMANENT PACEMAKER INSERTION;  Surgeon: Evans Lance, MD;  Location: [facility] CATH LAB;  Service: Cardiovascular;  Laterality: N/A;     Family History  Problem Relation Age of Onset  . Pseudochol deficiency Neg Hx   . Malignant hyperthermia Neg Hx   . Hypotension Neg Hx   . Anesthesia problems Neg Hx   . Cancer Mother   . Stroke Father      Social History   Social History  . Marital Status: Widowed    Spouse Name: N/A  . Number of Children: N/A  . Years of Education: N/A   Occupational History  . Not on file.   Social History Main Topics  . Smoking status: Never Smoker   . Smokeless tobacco: Never Used  . Alcohol Use: No  . Drug Use: No  . Sexual Activity: Yes    Birth Control/ Protection: Post-menopausal   Other Topics Concern  . Not on file   Social History Narrative     BP 104/78 mmHg  Pulse 64  Ht 5\' 7"  (1.702 m)  Wt 149 lb (67.586 kg)  BMI 23.33 kg/m2  SpO2 98%  Physical Exam:  stable appearing 81 yo woman, NAD HEENT: Unremarkable Neck:  6  cm JVD, no thyromegally Back:  No CVA tenderness Lungs:  Clear with no wheezes, rales or rhonchi; well healed PPM incision. HEART:  Regular rate rhythm, no murmurs, no rubs, no clicks Abd:  soft, positive bowel sounds, no organomegally, no rebound, no guarding Ext:  2 plus pulses, no edema, no cyanosis, no clubbing Skin:  No rashes no nodules Neuro:  CN II through XII intact, motor grossly intact  DEVICE  Normal device function.  See PaceArt for details.   Assess/Plan:

## 2015-02-09 IMAGING — CR DG CHEST 2V
2 series · 2 of 2 positions shown · non-contrast
Comparison: 04/24/2011.

CLINICAL DATA: Sore arm post pacemaker.

CHEST - 2 VIEW

[w chest pa]
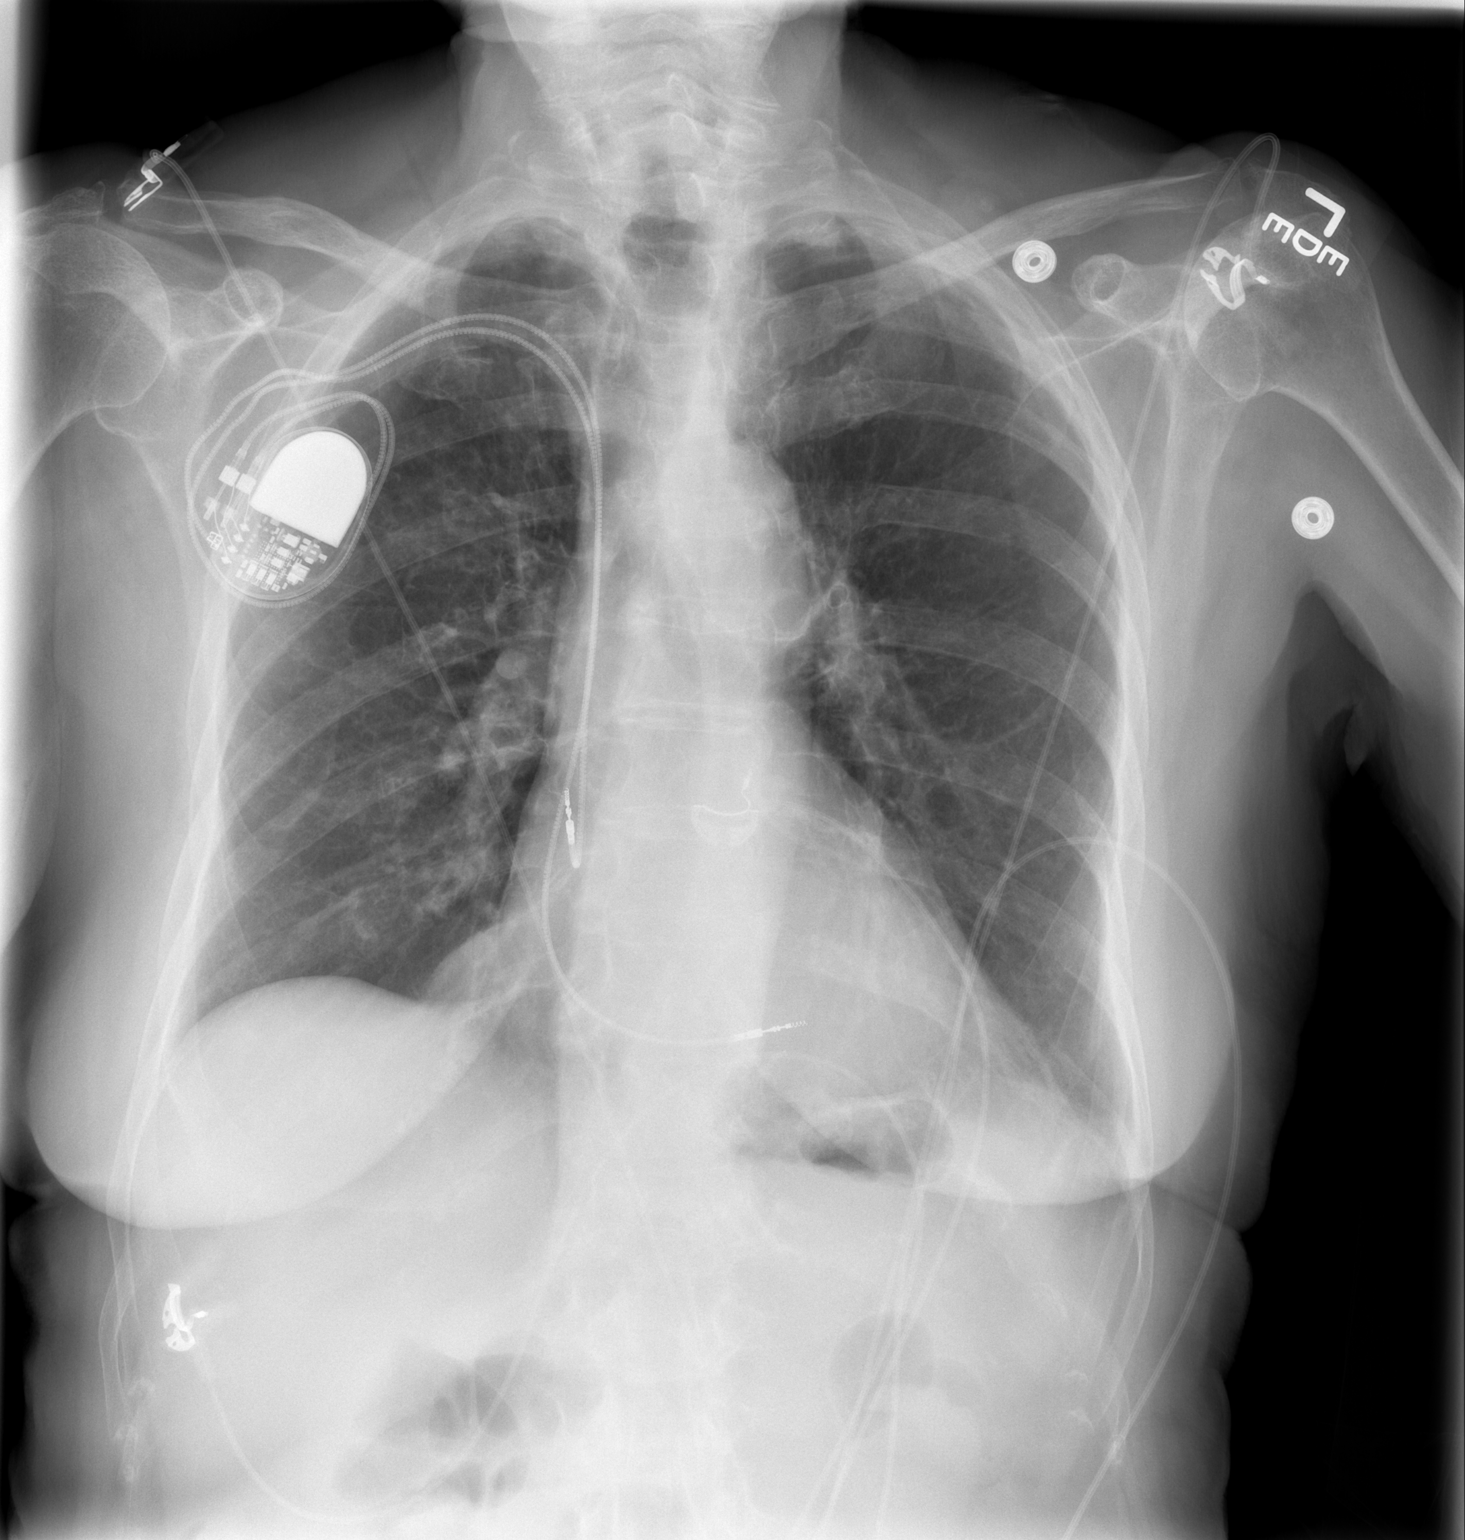

[w chest lat]
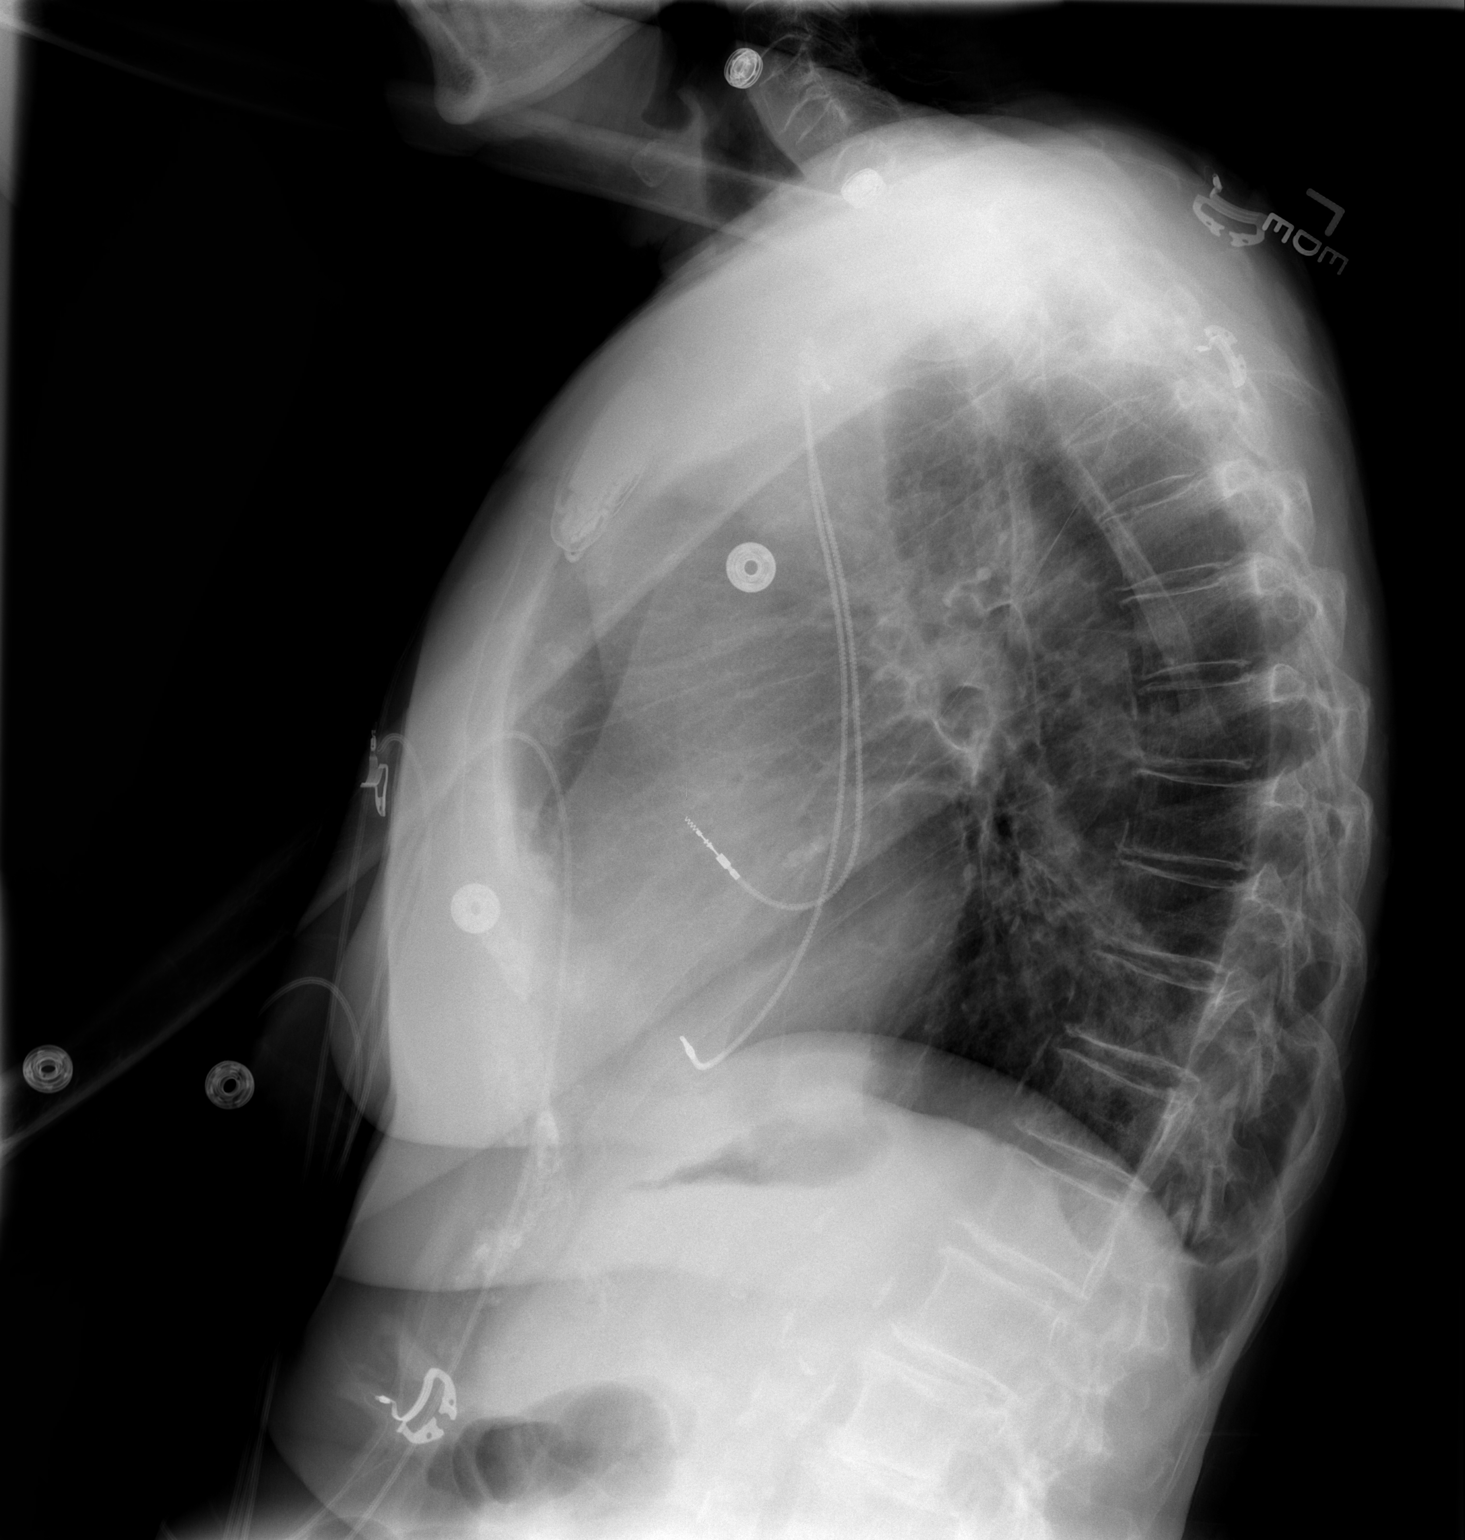

[2 of 2 positions shown; findings below may reference images not displayed]

FINDINGS: Sequential pacemaker placed from the right.  Leads appear
uninterrupted with the tips in the region of the right atrium and
right ventricle.  Heart size within normal limits.  No pneumothorax
or evidence of significant pleural effusion.  Mild elevation of the
right hemidiaphragm without significant change.

Biapical pleural thickening without associated bony destruction.

No segmental infiltrate or pulmonary edema.

No thoracic compression deformity.
IMPRESSION: Sequential pacemaker placed from the right.  Leads appear
uninterrupted with the tips in the region of the right atrium and
right ventricle.  Heart size within normal limits.  No pneumothorax
or evidence of significant pleural effusion.  Mild elevation of the
right hemidiaphragm without significant change.

## 2015-02-12 IMAGING — CR DG CHEST 2V
2 series · 2 of 2 positions shown · non-contrast
Comparison: 02/17/2013

CLINICAL DATA: Right chest and right upper extremity pain status
post pacemaker insertion.

EXAM:
CHEST  2 VIEW

[view not recorded (1 of 2)]
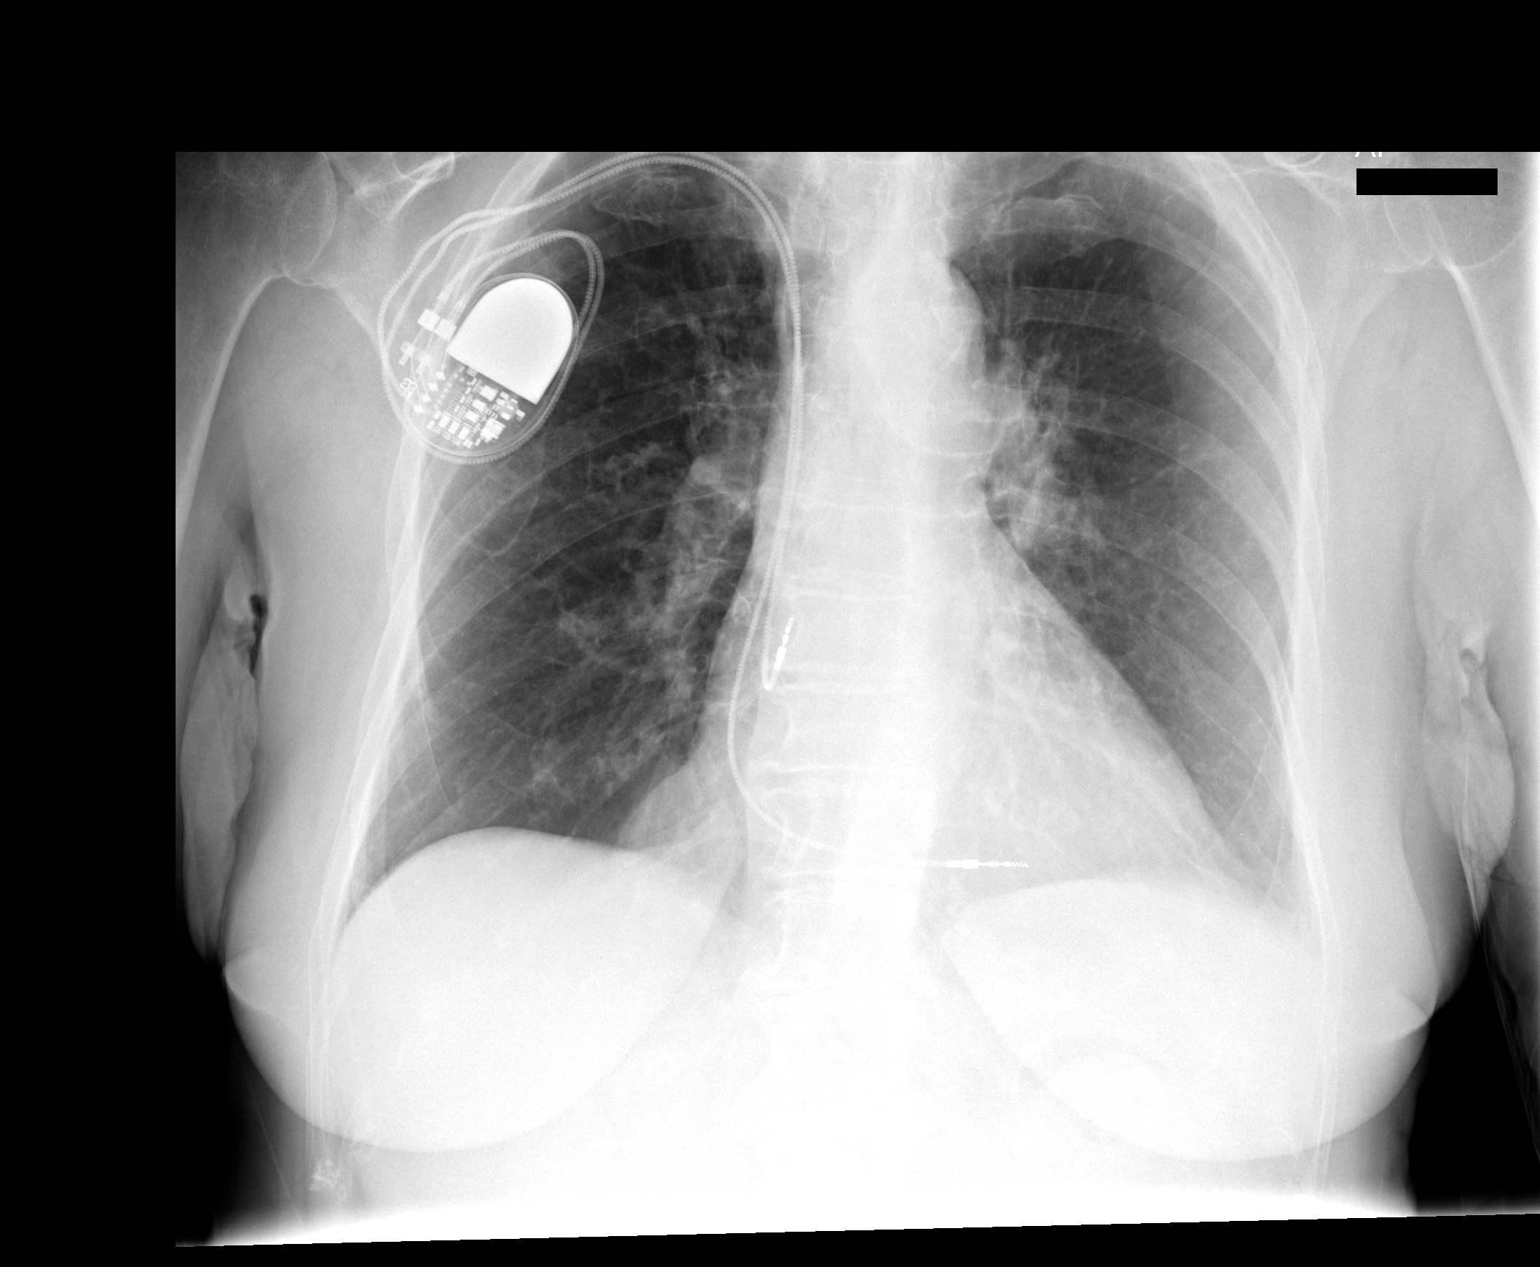

[view not recorded (2 of 2)]
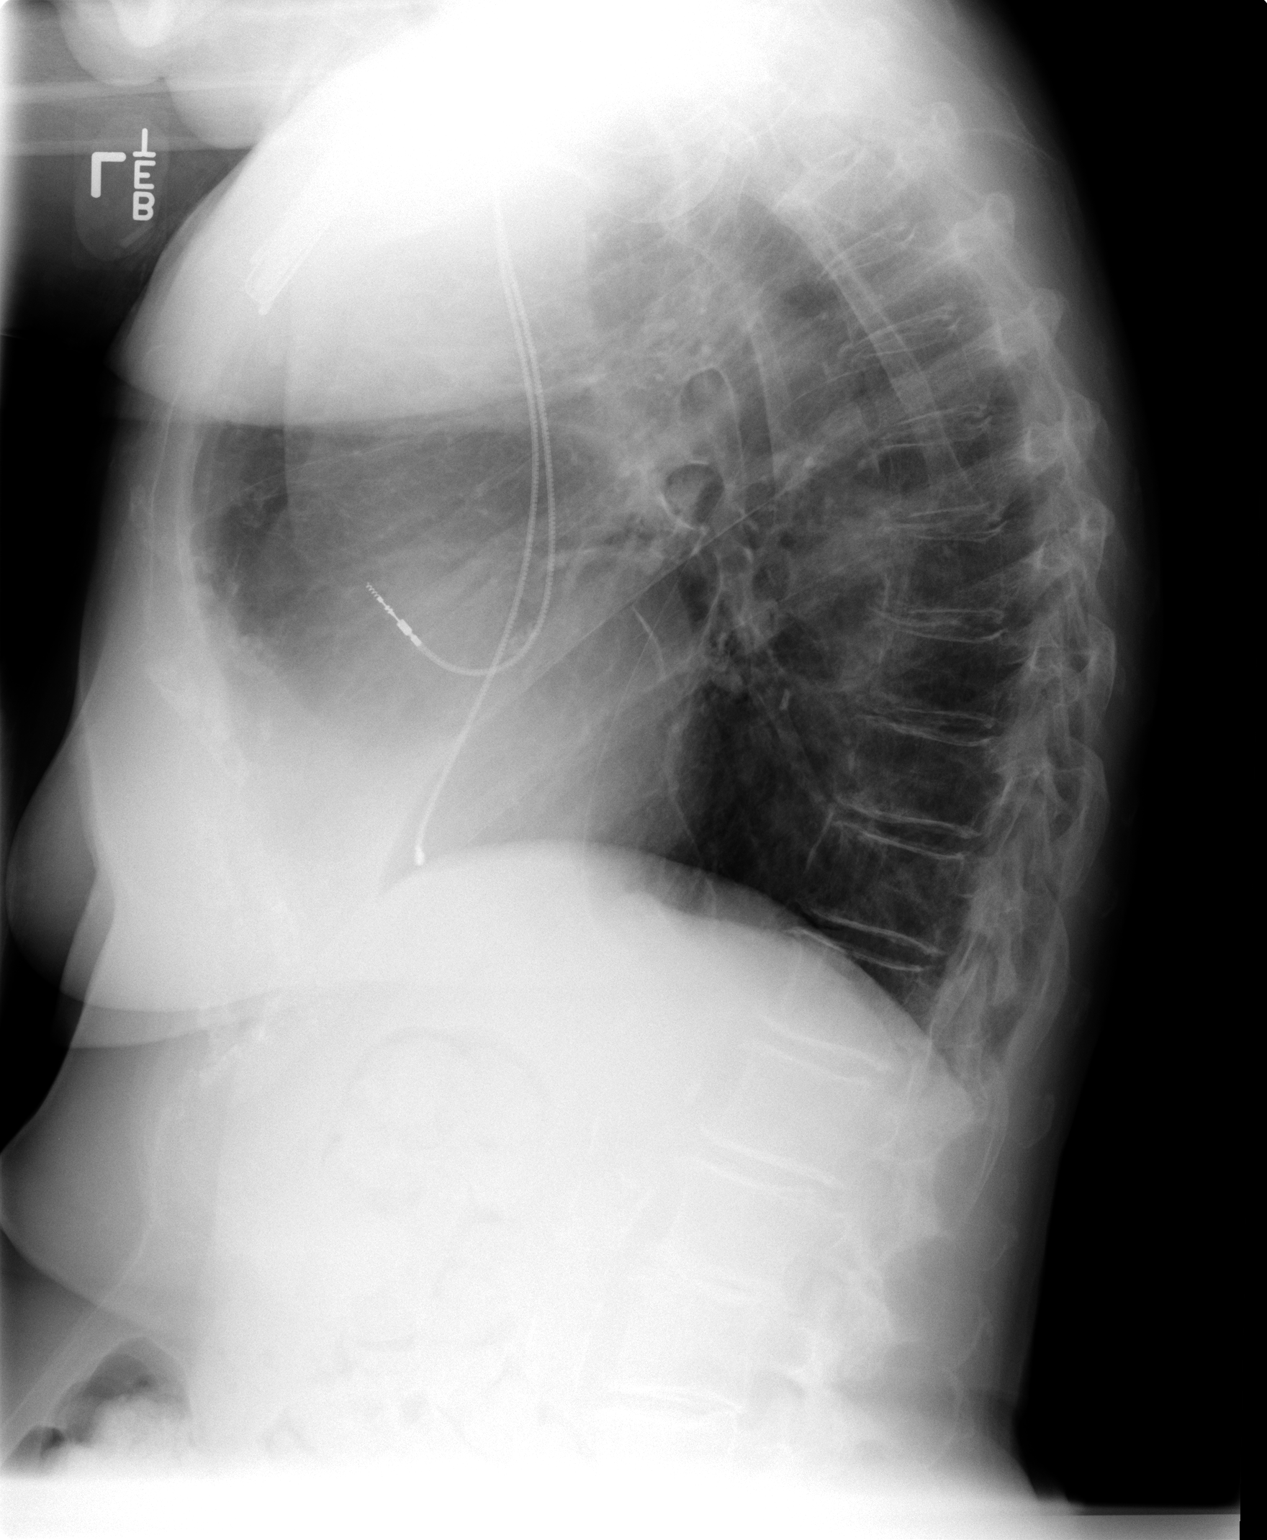

[2 of 2 positions shown; findings below may reference images not displayed]

FINDINGS: Right anterior chest wall sequential pacemaker is stable. Is leads
project within the right atrium right ventricle. There is no
pneumothorax.

Cardiac silhouette is normal in size and configuration. The
mediastinum is normal in contour. There are no hilar masses.

There is mild central bronchitic change as well as mild apical lung
scarring, stable. The lungs are otherwise clear.

The bony thorax is demineralized but grossly intact.
IMPRESSION: No acute cardiopulmonary disease.

No radiographic evidence of a complication from pacemaker placement.

## 2015-02-19 ENCOUNTER — Encounter: Payer: Self-pay | Admitting: Internal Medicine

## 2015-02-28 ENCOUNTER — Encounter: Payer: Self-pay | Admitting: Internal Medicine

## 2015-03-18 ENCOUNTER — Ambulatory Visit: Payer: Self-pay | Admitting: Gastroenterology

## 2015-03-19 ENCOUNTER — Ambulatory Visit: Payer: Self-pay | Admitting: Cardiovascular Disease

## 2015-03-25 ENCOUNTER — Encounter: Payer: Self-pay | Admitting: Nurse Practitioner

## 2015-03-25 ENCOUNTER — Ambulatory Visit (INDEPENDENT_AMBULATORY_CARE_PROVIDER_SITE_OTHER): Payer: Medicare Other | Admitting: Nurse Practitioner

## 2015-03-25 VITALS — BP 137/82 | HR 80 | Temp 97.4°F | Ht 67.0 in | Wt 148.0 lb

## 2015-03-25 DIAGNOSIS — R7989 Other specified abnormal findings of blood chemistry: Secondary | ICD-10-CM

## 2015-03-25 DIAGNOSIS — R945 Abnormal results of liver function studies: Principal | ICD-10-CM

## 2015-03-25 LAB — HEPATIC FUNCTION PANEL
ALT: 36 U/L — ABNORMAL HIGH (ref 6–29)
AST: 33 U/L (ref 10–35)
Albumin: 3.9 g/dL (ref 3.6–5.1)
Alkaline Phosphatase: 118 U/L (ref 33–130)
BILIRUBIN INDIRECT: 0.5 mg/dL (ref 0.2–1.2)
Bilirubin, Direct: 0.1 mg/dL (ref <=0.2)
TOTAL PROTEIN: 6.3 g/dL (ref 6.1–8.1)
Total Bilirubin: 0.6 mg/dL (ref 0.2–1.2)

## 2015-03-25 LAB — HEPATITIS C ANTIBODY: HCV Ab: NEGATIVE

## 2015-03-25 NOTE — Patient Instructions (Signed)
1. Have your labs drawn when you're able to. 2. We will help you schedule an appointment for your ultrasound. 3. Return for follow-up in 2 months.

## 2015-03-25 NOTE — Progress Notes (Signed)
Referring Provider: Glenda Chroman., MD Primary Care Physician:  Glenda Chroman., MD Primary GI:  Dr. Gala Romney  Chief Complaint  Patient presents with  . Follow-up    elevated LFT'S    HPI:   81 year old female presents on referral from hematology/oncology for elevated LFTs. Last CMP drawn 01/29/2015 which showed AST 63 and ALT 76. Alkaline phosphatase is normal at 92 and total bilirubin was normal at 0.5. Last CBC 12/27/2014 shows normal platelets at 210. Last abdominal imaging was a complete abdominal ultrasound on 02/24/2011 which showed complicated cystic lesion within the caudate lobe measuring 1.4 x 1.3 x 1.7 cm which was also noted on prior CT, no additional hepatic mass, normal parenchymal echogenicity, no intrahepatic biliary dilation.  Today she states she's not having a good day "well, I'm 89." Denies abdominal pain, no recent change in bowel habits, occasional constipation which daughter states "likely because of not enough fiber in her diet." Decreased energy level and now wheelchair bound. Has recently been treated for stage 1 non-Hodgkin's lymphoma with chemo and radiation which her daughter states really wore her out. "I think the radiation just took everything out of her, she hasn't gained her strength back since." Has also had a PPM placed after cancer treatment which also affected her energy "it took her down too." Is in a nursing home, attempted PT but it's too difficult for her. Had recent bout of GERD which she is on PPI by PCP. Denies hematochezia and melena, yellowing of skin or eyes. Has had colon resection for precancerous lesion, did well after that. Denies any other upper or lower GI symptoms.  Past Medical History  Diagnosis Date  . GERD (gastroesophageal reflux disease)        . Hyperlipidemia   . Hypertension   . Osteoarthritis of lumbar spine   . PONV (postoperative nausea and vomiting) 2009    after total hip  . Deviated nasal septum     pt had septoplasty    . Nasal turbinate hypertrophy     bilateral  . Cancer (HCC)     nasal cancer  . Cancer ([place])     cancer in the nose and in lower jaw  . Atrial fibrillation ([place])   . Dizziness   . Itching   . Diffuse large B cell lymphoma (H[facility])     Dr. Tressie Stalker -Nasal cavity, right sided lymph adenopathy  . OAB (overactive bladder)   . 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 Total hip arthroplasty  2009    Right, [facility] (general)  . Bunionectomy  20+yrs ago    bilateral, [facility]  . Hammer toe surgery      20+ yrs ago  . Cataract extraction w/ intraocular lens  implant, bilateral  2010    [facility]  . Nasal septoplasty w/ turbinoplasty  12/18/2010    Procedure: NASAL SEPTOPLASTY WITH TURBINATE REDUCTION;  Surgeon: Ascencion Dike;  Location: [facility];  Service: ENT;  Laterality: Bilateral;  . Colon surgery  10/29/2008    Ingram-right hemicolectomy for tubulovillous adenoma (3cm) with HG glandular dysplasia  . Portacath placement  02/10/2011    Procedure: INSERTION PORT-A-CATH;  Surgeon: Scherry Ran;  Location: [facility];  Service: General;  Laterality: Left;  left subclavian  . Skin cancer removal  6/13  . Colonoscopy  09/28/2002    RMR: Normal colonic mucosa.  Redundant and elongated but otherwise  normal appearing colon/normal rectum  . Colonoscopy  09/11/2008    RMR: Normal rectum/Diminutive sigmoid polyp status post snare removal/  Flat sessile semilunar lesion at and in the ileocecal valve status post hot snare piecemeal debulking as described above  . Colonoscopy  01/06/10    Rourk-normal rectum, normal residual colon status post right hemicolectomy. Next colonoscopy in 01/2015 health permiting  . Esophagogastroduodenoscopy (egd) with esophageal dilation  05/23/2012    [id number] ring-dilated as described above. Hiatal hernia  . Esophagogastroduodenoscopy Left 03/06/2013    Procedure: ESOPHAGOGASTRODUODENOSCOPY (EGD);  Surgeon: Jerene Bears, MD;  Location: [facility];  Service: Gastroenterology;  Laterality: Left;  . Pacemaker insertion Right 02/16/2013  . Port-a-cath removal Left 02/16/2013  . Permanent pacemaker insertion N/A 02/16/2013    Procedure: PERMANENT PACEMAKER INSERTION;  Surgeon: Evans Lance, MD;  Location: [facility] CATH LAB;  Service: Cardiovascular;  Laterality: N/A;    Current Outpatient Prescriptions  Medication Sig Dispense Refill  . amiodarone (PACERONE) 200 MG tablet Take 1 tablet (200 mg total) by mouth daily.    Marland Kitchen apixaban (ELIQUIS) 5 MG TABS tablet Take 1 tablet (5 mg total) by mouth 2 (two) times daily. 60 tablet 3  . Calcium-Vitamin D (CALTRATE 600 PLUS-VIT D PO) Take 1 tablet by mouth daily.    . Cholecalciferol (VITAMIN D3) 3000 UNITS TABS Take 1,000 Units by mouth daily.    Marland Kitchen dexlansoprazole (DEXILANT) 60 MG capsule Take 60 mg by mouth daily.    Marland Kitchen guaiFENesin (MUCINEX) 600 MG 12 hr tablet Take by mouth 2 (two) times daily as needed.    Marland Kitchen guaifenesin (ROBITUSSIN) 100 MG/5ML syrup Take 200 mg by mouth 3 (three) times daily as needed for cough.    . lactose free nutrition (BOOST PLUS) LIQD Take 237 mLs by mouth 3 (three) times daily with meals. 30 Can 0  . Multiple Vitamins-Minerals (CENTRUM SILVER ULTRA WOMENS) TABS Take 1 tablet by mouth daily.      . ondansetron (ZOFRAN) 4 MG tablet Take 4 mg by mouth every 6 (six) hours as needed for nausea or vomiting.    Marland Kitchen oxybutynin (DITROPAN) 5 MG tablet Take 5 mg by mouth daily.     . polyethylene glycol (MIRALAX / GLYCOLAX) packet Take 17 g by mouth daily as needed.    Marland Kitchen acetaminophen (TYLENOL) 500 MG tablet Take 500 mg by mouth every 8 (eight) hours as needed.     No current facility-administered medications for this visit.   Facility-Administered Medications Ordered in Other Visits  Medication Dose Route Frequency Provider Last Rate Last Dose  . heparin lock flush 100 unit/mL  500 Units Intravenous Once Baird Cancer, PA-C      . sodium chloride  0.9 % injection 10 mL  10 mL Intravenous PRN Baird Cancer, PA-C        Allergies as of 03/25/2015 - Review Complete 02/07/2015  Allergen Reaction Noted  . Statins Other (See Comments) 12/12/2010    Family History  Problem Relation Age of Onset  . Pseudochol deficiency Neg Hx   . Malignant hyperthermia Neg Hx   . Hypotension Neg Hx   . Anesthesia problems Neg Hx   . Cancer Mother   . Stroke Father     Social History   Social History  . Marital Status: Widowed    Spouse Name: N/A  . Number of Children: N/A  . Years of Education: N/A   Social History Main Topics  . Smoking status: Never Smoker   .  Smokeless tobacco: Never Used  . Alcohol Use: No  . Drug Use: No  . Sexual Activity: Yes    Birth Control/ Protection: Post-menopausal   Other Topics Concern  . None   Social History Narrative    Review of Systems: General: Negative for anorexia, weight loss, fever, chills. Eyes: Negative for vision changes.  ENT: Negative for hoarseness, difficulty swallowing. CV: Negative for chest pain, angina, palpitations, peripheral edema.  Respiratory: Negative for dyspnea at rest, cough, sputum, wheezing.  GI: See history of present illness. Derm: Negative for rash or itching.  Endo: Negative for unusual weight change.  Heme: Negative for bruising or bleeding. Allergy: Negative for rash or hives.   Physical Exam: BP 137/82 mmHg  Pulse 80  Temp(Src) 97.4 F (36.3 C) (Oral)  Ht 5\' 7"  (1.702 m)  Wt 148 lb (67.132 kg)  BMI 23.17 kg/m2 General:   Alert and oriented. Pleasant and cooperative. Well-nourished and well-developed.  Head:  Normocephalic and atraumatic. Eyes:  Without icterus, sclera clear and conjunctiva pink.  Cardiovascular:  S1, S2 present without murmurs appreciated. Extremities without clubbing or edema. Respiratory:  Clear to auscultation bilaterally. No wheezes, rales, or rhonchi. No distress.  Gastrointestinal:  +BS, soft, non-tender and non-distended.  No HSM noted. No guarding or rebound. No masses appreciated.  Rectal:  Deferred  Musculoskalatal:  Sitting in a wheelchair, gait and posture not assessed.. Neurologic:  Alert and oriented x4;  grossly normal neurologically. Psych:  Alert and cooperative. Normal mood and affect. Heme/Lymph/Immune: No excessive bruising noted.    03/25/2015 10:04 AM

## 2015-03-29 ENCOUNTER — Ambulatory Visit: Payer: Self-pay | Admitting: Cardiovascular Disease

## 2015-04-01 NOTE — Assessment & Plan Note (Signed)
Patient referred by oncology for elevated liver enzymes. Today we'll recheck her hepatic function panel, hepatitis C antibody, and an abdominal ultrasound. Recent labs show mild elevation in AST/ALT with other hepatic parameters normal. Her last ultrasound was in 2012. We'll have her follow-up in 2 months. Otherwise she is asymptomatic from a GI standpoint.

## 2015-04-01 NOTE — Progress Notes (Signed)
cc'ed to pcp °

## 2015-04-02 ENCOUNTER — Ambulatory Visit (HOSPITAL_COMMUNITY)
Admission: RE | Admit: 2015-04-02 | Discharge: 2015-04-02 | Disposition: A | Discharge status: Home / Self Care | Payer: Medicare Other | Source: Ambulatory Visit | Attending: Nurse Practitioner | Admitting: Nurse Practitioner

## 2015-04-02 DIAGNOSIS — R7989 Other specified abnormal findings of blood chemistry: Secondary | ICD-10-CM | POA: Insufficient documentation

## 2015-04-02 DIAGNOSIS — K8080 Other cholelithiasis without obstruction: Secondary | ICD-10-CM | POA: Diagnosis not present

## 2015-04-02 DIAGNOSIS — R945 Abnormal results of liver function studies: Secondary | ICD-10-CM

## 2015-04-08 ENCOUNTER — Ambulatory Visit: Payer: Self-pay | Admitting: Gastroenterology

## 2015-04-10 ENCOUNTER — Ambulatory Visit (INDEPENDENT_AMBULATORY_CARE_PROVIDER_SITE_OTHER): Payer: Medicare Other | Admitting: Cardiovascular Disease

## 2015-04-10 ENCOUNTER — Encounter: Payer: Self-pay | Admitting: Cardiovascular Disease

## 2015-04-10 VITALS — BP 127/79 | HR 67 | Ht 67.0 in | Wt 150.0 lb

## 2015-04-10 DIAGNOSIS — Z23 Encounter for immunization: Secondary | ICD-10-CM

## 2015-04-10 DIAGNOSIS — Z7901 Long term (current) use of anticoagulants: Secondary | ICD-10-CM

## 2015-04-10 DIAGNOSIS — Z950 Presence of cardiac pacemaker: Secondary | ICD-10-CM

## 2015-04-10 DIAGNOSIS — I495 Sick sinus syndrome: Secondary | ICD-10-CM

## 2015-04-10 DIAGNOSIS — I10 Essential (primary) hypertension: Secondary | ICD-10-CM | POA: Diagnosis not present

## 2015-04-10 DIAGNOSIS — I480 Paroxysmal atrial fibrillation: Secondary | ICD-10-CM

## 2015-04-10 NOTE — Progress Notes (Signed)
Patient ID: Sierra Good, female   DOB: 12/18/1922, 81 y.o.   MRN: [id number]      SUBJECTIVE: The patient is here to followup for management of atrial fibrillation. She is currently taking amiodarone 200 mg twice daily and Eliquis 5 mg twice daily for anticoagulation. She also has a pacemaker for symptomatic bradycardia and follows with Dr. Lovena Le. She also has a history of diffuse large B-cell lymphoma and is followed by Dr. Whitney Muse. She resides in assisted living. She is here with her daughter, [name].  Denies chest pain, palpitations, leg swelling, and shortness of breath.   Review of Systems: As per "subjective", otherwise negative.  Allergies  Allergen Reactions  . Statins Other (See Comments)    Causes leg cramps and muscle weakness    Current Outpatient Prescriptions  Medication Sig Dispense Refill  . amiodarone (PACERONE) 200 MG tablet Take 1 tablet (200 mg total) by mouth daily.    . Calcium-Vitamin D (CALTRATE 600 PLUS-VIT D PO) Take 1 tablet by mouth daily.    Marland Kitchen dexlansoprazole (DEXILANT) 60 MG capsule Take 60 mg by mouth daily.    . Multiple Vitamins-Minerals (CENTRUM SILVER ULTRA WOMENS) TABS Take 1 tablet by mouth daily.      . Cholecalciferol (VITAMIN D3) 3000 UNITS TABS Take 1,000 Units by mouth daily.    Marland Kitchen guaiFENesin (MUCINEX) 600 MG 12 hr tablet Take by mouth 2 (two) times daily as needed.    Marland Kitchen guaifenesin (ROBITUSSIN) 100 MG/5ML syrup Take 200 mg by mouth 3 (three) times daily as needed for cough.    . lactose free nutrition (BOOST PLUS) LIQD Take 237 mLs by mouth 3 (three) times daily with meals. 30 Can 0  . ondansetron (ZOFRAN) 4 MG tablet Take 4 mg by mouth every 6 (six) hours as needed for nausea or vomiting.    Marland Kitchen oxybutynin (DITROPAN) 5 MG tablet Take 5 mg by mouth daily.     . polyethylene glycol (MIRALAX / GLYCOLAX) packet Take 17 g by mouth daily as needed.     No current facility-administered medications for this visit.   Facility-Administered  Medications Ordered in Other Visits  Medication Dose Route Frequency Provider Last Rate Last Dose  . heparin lock flush 100 unit/mL  500 Units Intravenous Once Baird Cancer, PA-C      . sodium chloride 0.9 % injection 10 mL  10 mL Intravenous PRN Baird Cancer, PA-C        Past Medical History  Diagnosis Date  . GERD (gastroesophageal reflux disease)        . Hyperlipidemia   . Hypertension   . Osteoarthritis of lumbar spine   . PONV (postoperative nausea and vomiting) 2009    after total hip  . Deviated nasal septum     pt had septoplasty  . Nasal turbinate hypertrophy     bilateral  . Cancer (HCC)     nasal cancer  . Cancer ([place])     cancer in the nose and in lower jaw  . Atrial fibrillation ([place])   . Dizziness   . Itching   . Diffuse large B cell lymphoma (HCC)     Dr. Tressie Stalker -Nasal cavity, right sided lymph adenopathy  . OAB (overactive bladder)   . 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 Total hip arthroplasty  2009    Right, [facility] (general)  . Bunionectomy  20+yrs  ago    bilateral, [facility]  . Hammer toe surgery      20+ yrs ago  . Cataract extraction w/ intraocular lens  implant, bilateral  2010    [facility]  . Nasal septoplasty w/ turbinoplasty  12/18/2010    Procedure: NASAL SEPTOPLASTY WITH TURBINATE REDUCTION;  Surgeon: Ascencion Dike;  Location: [facility];  Service: ENT;  Laterality: Bilateral;  . Colon surgery  10/29/2008    Ingram-right hemicolectomy for tubulovillous adenoma (3cm) with HG glandular dysplasia  . Portacath placement  02/10/2011    Procedure: INSERTION PORT-A-CATH;  Surgeon: Scherry Ran;  Location: [facility];  Service: General;  Laterality: Left;  left subclavian  . Skin cancer removal  6/13  . Colonoscopy  09/28/2002    RMR: Normal colonic mucosa.  Redundant and elongated but otherwise normal appearing colon/normal rectum  . Colonoscopy  09/11/2008    RMR: Normal  rectum/Diminutive sigmoid polyp status post snare removal/  Flat sessile semilunar lesion at and in the ileocecal valve status post hot snare piecemeal debulking as described above  . Colonoscopy  01/06/10    Rourk-normal rectum, normal residual colon status post right hemicolectomy. Next colonoscopy in 01/2015 health permiting  . Esophagogastroduodenoscopy (egd) with esophageal dilation  05/23/2012    [id number] ring-dilated as described above. Hiatal hernia  . Esophagogastroduodenoscopy Left 03/06/2013    Procedure: ESOPHAGOGASTRODUODENOSCOPY (EGD);  Surgeon: Jerene Bears, MD;  Location: [facility];  Service: Gastroenterology;  Laterality: Left;  . Pacemaker insertion Right 02/16/2013  . Port-a-cath removal Left 02/16/2013  . Permanent pacemaker insertion N/A 02/16/2013    Procedure: PERMANENT PACEMAKER INSERTION;  Surgeon: Evans Lance, MD;  Location: [facility] CATH LAB;  Service: Cardiovascular;  Laterality: N/A;    Social History   Social History  . Marital Status: Widowed    Spouse Name: N/A  . Number of Children: N/A  . Years of Education: N/A   Occupational History  . Not on file.   Social History Main Topics  . Smoking status: Never Smoker   . Smokeless tobacco: Never Used  . Alcohol Use: No  . Drug Use: No  . Sexual Activity: Yes    Birth Control/ Protection: Post-menopausal   Other Topics Concern  . Not on file   Social History Narrative     Filed Vitals:   04/10/15 1029  Height: 5\' 7"  (1.702 m)  Weight: 150 lb (68.04 kg)    PHYSICAL EXAM General: NAD, elderly, frail. HEENT: Normal. Neck: No JVD, no thyromegaly. Lungs: Clear to auscultation bilaterally with normal respiratory effort. CV: Regular rate and rhythm, normal S1/S2, no S3/S4, no murmur. No pretibial or periankle edema.  Abdomen: Soft, nontender, no distention.  Neurologic: Alert and oriented.  Psych: Normal affect. Skin: Normal. Musculoskeletal: No gross deformities. Extremities: No clubbing  or cyanosis.   ECG: Most recent ECG reviewed.      ASSESSMENT AND PLAN: 1. Atrial fibrillation: Symptomatically stable. Continue amiodarone and Eliquis.  2. Pacemaker: Device interrogation demonstrated normal device function on 02/07/15. Follows with Dr. Lovena Le.  3. Essential HTN: Well controlled for age on current therapy. No changes.  Dispo: f/u 1 year.  Kate Sable, M.D., F.A.C.C.

## 2015-04-10 NOTE — Patient Instructions (Signed)
Continue all current medications. Your physician wants you to follow up in:  1 year.  You will receive a reminder letter in the mail one-two months in advance.  If you don't receive a letter, please call our office to schedule the follow up appointment   

## 2015-05-09 ENCOUNTER — Ambulatory Visit (INDEPENDENT_AMBULATORY_CARE_PROVIDER_SITE_OTHER): Payer: Medicare Other | Admitting: *Deleted

## 2015-05-09 ENCOUNTER — Telehealth: Payer: Self-pay | Admitting: Cardiology

## 2015-05-09 DIAGNOSIS — I495 Sick sinus syndrome: Secondary | ICD-10-CM

## 2015-05-09 NOTE — Telephone Encounter (Signed)
LMOVM reminding pt to send remote transmission.   

## 2015-05-10 NOTE — Progress Notes (Signed)
Remote pacemaker transmission.   

## 2015-05-14 ENCOUNTER — Telehealth: Payer: Self-pay | Admitting: Cardiovascular Disease

## 2015-05-14 NOTE — Telephone Encounter (Signed)
New message  4. Are you calling to see if we received your device transmission? Darreld Mclean

## 2015-05-14 NOTE — Telephone Encounter (Signed)
Informed pt daughter that we did receive her transmission on 05-09-15. She verbalized understanding.

## 2015-05-16 LAB — CUP PACEART REMOTE DEVICE CHECK
Battery Remaining Longevity: 112 mo
Brady Statistic AP VS Percent: 76 %
Brady Statistic AS VP Percent: 0 %
Date Time Interrogation Session: 20161201183333
Implantable Lead Implant Date: 20140911
Implantable Lead Implant Date: 20140911
Implantable Lead Location: 753860
Implantable Lead Model: 5076
Lead Channel Pacing Threshold Amplitude: 0.75 V
Lead Channel Pacing Threshold Pulse Width: 0.4 ms
Lead Channel Pacing Threshold Pulse Width: 0.4 ms
Lead Channel Setting Pacing Amplitude: 2 V
Lead Channel Setting Pacing Pulse Width: 0.4 ms
Lead Channel Setting Sensing Sensitivity: 5.6 mV
MDC IDC LEAD LOCATION: 753859
MDC IDC MSMT BATTERY IMPEDANCE: 179 Ohm
MDC IDC MSMT BATTERY VOLTAGE: 2.79 V
MDC IDC MSMT LEADCHNL RA IMPEDANCE VALUE: 686 Ohm
MDC IDC MSMT LEADCHNL RA PACING THRESHOLD AMPLITUDE: 0.5 V
MDC IDC MSMT LEADCHNL RV IMPEDANCE VALUE: 444 Ohm
MDC IDC MSMT LEADCHNL RV SENSING INTR AMPL: 11.2 mV
MDC IDC SET LEADCHNL RV PACING AMPLITUDE: 2.5 V
MDC IDC STAT BRADY AP VP PERCENT: 24 %
MDC IDC STAT BRADY AS VS PERCENT: 0 %

## 2015-05-21 ENCOUNTER — Encounter: Payer: Self-pay | Admitting: Cardiology

## 2015-05-27 ENCOUNTER — Encounter: Payer: Self-pay | Admitting: Nurse Practitioner

## 2015-05-27 ENCOUNTER — Ambulatory Visit (INDEPENDENT_AMBULATORY_CARE_PROVIDER_SITE_OTHER): Payer: Medicare Other | Admitting: Nurse Practitioner

## 2015-05-27 ENCOUNTER — Other Ambulatory Visit: Payer: Self-pay

## 2015-05-27 VITALS — BP 127/65 | HR 65 | Temp 96.6°F | Ht 66.0 in

## 2015-05-27 DIAGNOSIS — R7989 Other specified abnormal findings of blood chemistry: Secondary | ICD-10-CM

## 2015-05-27 DIAGNOSIS — R945 Abnormal results of liver function studies: Principal | ICD-10-CM

## 2015-05-27 NOTE — Patient Instructions (Signed)
1. We will put in lab orders for the future around the time you're scheduled to see Dr. Whitney Muse. These can be combined in the duplicate's removed to save you a trip and needing blood work twice back-to-back. 2. Return as needed for any changing or recurrent symptoms. If your labs are abnormal we will have you come back on a scheduled basis. Otherwise, call us if you need [place].

## 2015-05-27 NOTE — Progress Notes (Signed)
Referring Provider: Glenda Chroman., MD Primary Care Physician:  Glenda Chroman., MD Primary GI:  Dr. Gala Romney  Chief Complaint  Patient presents with  . elevated LFT    HPI:   81 year old female presents for follow-up on elevated LFTs. She was last seen in our office 03/25/2015 after referral from hematology oncology for elevated AST/ALT. At that time her AST/ALT were 63/76 with all other liver parameters normal. She was status posttreatment for type I Hodgkin's lymphoma. Her CBC was normal at that time as well. We recheck her liver function, hepatitis C antibody, and abdominal ultrasound at that time. Her hepatic function test showed improvement in AST/ALT (33/36), negative hepatitis C antibody, an abdominal ultrasound showing gallstones without sonographic evidence of cholecystitis and stable/smaller known caudate lobe cyst.  Today she states she's doing well related to GI. Denies abdominal pain, N/V, yellowing skin/eyes, darkened urine, generalized pruritis, fever, chills, hematochezia, melena, excessive bruising/bleeding. Denies chest pain, dyspnea, dizziness, lightheadedness, syncope, near syncope. Denies any other upper or lower GI symptoms.  Past Medical History  Diagnosis Date  . GERD (gastroesophageal reflux disease)        . Hyperlipidemia   . Hypertension   . Osteoarthritis of lumbar spine   . PONV (postoperative nausea and vomiting) 2009    after total hip  . Deviated nasal septum     pt had septoplasty  . Nasal turbinate hypertrophy     bilateral  . Cancer (HCC)     nasal cancer  . Cancer ([place])     cancer in the nose and in lower jaw  . Atrial fibrillation ([place])   . Dizziness   . Itching   . Diffuse large B cell lymphoma (HCC)     Dr. Tressie Stalker -Nasal cavity, right sided lymph adenopathy  . OAB (overactive bladder)   . 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 Total hip arthroplasty  2009    Right, [facility] (general)  . Bunionectomy  20+yrs ago    bilateral, [facility]  . Hammer toe surgery      20+ yrs ago  . Cataract extraction w/ intraocular lens  implant, bilateral  2010    [facility]  . Nasal septoplasty w/ turbinoplasty  12/18/2010    Procedure: NASAL SEPTOPLASTY WITH TURBINATE REDUCTION;  Surgeon: Ascencion Dike;  Location: [facility];  Service: ENT;  Laterality: Bilateral;  . Colon surgery  10/29/2008    Ingram-right hemicolectomy for tubulovillous adenoma (3cm) with HG glandular dysplasia  . Portacath placement  02/10/2011    Procedure: INSERTION PORT-A-CATH;  Surgeon: Scherry Ran;  Location: [facility];  Service: General;  Laterality: Left;  left subclavian  . Skin cancer removal  6/13  . Colonoscopy  09/28/2002    RMR: Normal colonic mucosa.  Redundant and elongated but otherwise normal appearing colon/normal rectum  . Colonoscopy  09/11/2008    RMR: Normal rectum/Diminutive sigmoid polyp status post snare removal/  Flat sessile semilunar lesion at and in the ileocecal valve status post hot snare piecemeal debulking as described above  . Colonoscopy  01/06/10    Rourk-normal rectum, normal residual colon status post right hemicolectomy. Next colonoscopy in 01/2015 health permiting  . Esophagogastroduodenoscopy (egd) with esophageal dilation  05/23/2012    [id number] ring-dilated as described above. Hiatal hernia  . Esophagogastroduodenoscopy Left 03/06/2013    Procedure: ESOPHAGOGASTRODUODENOSCOPY (EGD);  Surgeon: Jerene Bears, MD;  Location: [facility] ENDOSCOPY;  Service: Gastroenterology;  Laterality: Left;  . Pacemaker insertion Right 02/16/2013  . Port-a-cath removal Left 02/16/2013  . Permanent pacemaker insertion N/A 02/16/2013    Procedure: PERMANENT PACEMAKER INSERTION;  Surgeon: Evans Lance, MD;  Location: [facility] CATH LAB;  Service: Cardiovascular;  Laterality: N/A;    Current Outpatient Prescriptions  Medication Sig Dispense Refill  . acetaminophen (TYLENOL) 500 MG tablet Take 500 mg by  mouth every 8 (eight) hours as needed.    Marland Kitchen amiodarone (PACERONE) 200 MG tablet Take 1 tablet (200 mg total) by mouth daily.    . Calcium-Vitamin D (CALTRATE 600 PLUS-VIT D PO) Take 1 tablet by mouth daily.    . cholecalciferol (VITAMIN D) 1000 UNITS tablet Take 1,000 Units by mouth daily.    Marland Kitchen dexlansoprazole (DEXILANT) 60 MG capsule Take 60 mg by mouth daily.    Marland Kitchen ELIQUIS 5 MG TABS tablet Take 1 tablet by mouth daily. At 5:00 pm daily    . guaiFENesin (MUCINEX) 600 MG 12 hr tablet Take 600 mg by mouth 2 (two) times daily as needed.     Marland Kitchen guaifenesin (ROBITUSSIN) 100 MG/5ML syrup Take 200 mg by mouth 3 (three) times daily as needed for cough.    Marland Kitchen ibuprofen (ADVIL,MOTRIN) 400 MG tablet Take 2 tablets by mouth every 6 (six) hours as needed.    . Multiple Vitamins-Minerals (CENTRUM SILVER ULTRA WOMENS) TABS Take 1 tablet by mouth daily.      Marland Kitchen oxybutynin (DITROPAN) 5 MG tablet Take 5 mg by mouth daily.     . pantoprazole (PROTONIX) 40 MG tablet Take 1 tablet by mouth 2 (two) times daily.    . polyethylene glycol (MIRALAX / GLYCOLAX) packet Take 17 g by mouth daily as needed.    . fluconazole (DIFLUCAN) 150 MG tablet      No current facility-administered medications for this visit.   Facility-Administered Medications Ordered in Other Visits  Medication Dose Route Frequency Provider Last Rate Last Dose  . heparin lock flush 100 unit/mL  500 Units Intravenous Once Baird Cancer, PA-C      . sodium chloride 0.9 % injection 10 mL  10 mL Intravenous PRN Baird Cancer, PA-C        Allergies as of 05/27/2015 - Review Complete 05/27/2015  Allergen Reaction Noted  . Statins Other (See Comments) 12/12/2010    Family History  Problem Relation Age of Onset  . Pseudochol deficiency Neg Hx   . Malignant hyperthermia Neg Hx   . Hypotension Neg Hx   . Anesthesia problems Neg Hx   . Cancer Mother   . Stroke Father     Social History   Social History  . Marital Status: Widowed    Spouse  Name: N/A  . Number of Children: N/A  . Years of Education: N/A   Social History Main Topics  . Smoking status: Never Smoker   . Smokeless tobacco: Never Used  . Alcohol Use: No  . Drug Use: No  . Sexual Activity: Yes    Birth Control/ Protection: Post-menopausal   Other Topics Concern  . None   Social History Narrative    Review of Systems: General: Negative for anorexia, weight loss, fever, chills, fatigue, weakness. ENT: Negative for hoarseness, nasal congestion. CV: Negative for chest pain, angina, palpitations.  Respiratory: Negative for dyspnea at rest, cough, sputum, wheezing.  GI: See history of present illness. Endo: Negative for unusual weight change.  Heme: Negative for bruising or bleeding.   Physical Exam:  BP 127/65 mmHg  Pulse 65  Temp(Src) 96.6 F (35.9 C)  Ht 5\' 6"  (1.676 m)  Wt  General:   Alert and oriented. Pleasant and cooperative. Well-nourished and well-developed. Sitting in wheelchair. Head:  Normocephalic and atraumatic. Eyes:  Without icterus, sclera clear and conjunctiva pink.  Cardiovascular:  S1, S2 present without murmurs appreciated. Normal pulses noted. Extremities without clubbing or edema. Respiratory:  Clear to auscultation bilaterally. No wheezes, rales, or rhonchi. No distress.  Gastrointestinal:  +BS, soft, non-tender and non-distended. No HSM noted. No guarding or rebound. No masses appreciated.  Rectal:  Deferred  Neurologic:  Alert and oriented x4;  grossly normal neurologically. Psych:  Alert and cooperative. Normal mood and affect. Heme/Lymph/Immune: No excessive bruising noted.    05/27/2015 10:58 AM

## 2015-05-27 NOTE — Assessment & Plan Note (Signed)
LFTs rechecked at last office visit were substantially improved. Only minor elevation in ALT. At this point she is a symptomatically GI standpoint. We will order a recheck of her labs in conjunction with her next hematology/oncology visit. We'll plan on follow up as needed unless there is a dramatic change in her labs at which point we will notify her of need for routine follow-up.

## 2015-05-27 NOTE — Progress Notes (Signed)
cc'ed to pcp °

## 2015-06-04 ENCOUNTER — Encounter: Payer: Self-pay | Admitting: Cardiology

## 2015-06-28 ENCOUNTER — Ambulatory Visit (HOSPITAL_COMMUNITY): Payer: Self-pay | Admitting: Hematology & Oncology

## 2015-06-28 ENCOUNTER — Other Ambulatory Visit (HOSPITAL_COMMUNITY): Payer: Self-pay

## 2015-07-04 ENCOUNTER — Ambulatory Visit (INDEPENDENT_AMBULATORY_CARE_PROVIDER_SITE_OTHER): Payer: Medicare Other | Admitting: Otolaryngology

## 2015-07-04 DIAGNOSIS — Z8522 Personal history of malignant neoplasm of nasal cavities, middle ear, and accessory sinuses: Secondary | ICD-10-CM | POA: Diagnosis not present

## 2015-07-18 ENCOUNTER — Encounter (HOSPITAL_COMMUNITY): Payer: Medicare Other

## 2015-07-18 ENCOUNTER — Encounter (HOSPITAL_COMMUNITY): Payer: Medicare Other | Attending: Hematology & Oncology | Admitting: Hematology & Oncology

## 2015-07-18 ENCOUNTER — Encounter (HOSPITAL_COMMUNITY): Payer: Self-pay | Admitting: Hematology & Oncology

## 2015-07-18 VITALS — BP 134/62 | HR 62 | Temp 98.8°F | Resp 18

## 2015-07-18 DIAGNOSIS — K219 Gastro-esophageal reflux disease without esophagitis: Secondary | ICD-10-CM | POA: Diagnosis not present

## 2015-07-18 DIAGNOSIS — Z950 Presence of cardiac pacemaker: Secondary | ICD-10-CM | POA: Diagnosis not present

## 2015-07-18 DIAGNOSIS — Z9889 Other specified postprocedural states: Secondary | ICD-10-CM | POA: Diagnosis not present

## 2015-07-18 DIAGNOSIS — Z888 Allergy status to other drugs, medicaments and biological substances status: Secondary | ICD-10-CM | POA: Insufficient documentation

## 2015-07-18 DIAGNOSIS — Z809 Family history of malignant neoplasm, unspecified: Secondary | ICD-10-CM | POA: Insufficient documentation

## 2015-07-18 DIAGNOSIS — M47896 Other spondylosis, lumbar region: Secondary | ICD-10-CM | POA: Insufficient documentation

## 2015-07-18 DIAGNOSIS — Z8522 Personal history of malignant neoplasm of nasal cavities, middle ear, and accessory sinuses: Secondary | ICD-10-CM | POA: Insufficient documentation

## 2015-07-18 DIAGNOSIS — Z96643 Presence of artificial hip joint, bilateral: Secondary | ICD-10-CM | POA: Insufficient documentation

## 2015-07-18 DIAGNOSIS — E785 Hyperlipidemia, unspecified: Secondary | ICD-10-CM | POA: Insufficient documentation

## 2015-07-18 DIAGNOSIS — Z823 Family history of stroke: Secondary | ICD-10-CM | POA: Insufficient documentation

## 2015-07-18 DIAGNOSIS — R945 Abnormal results of liver function studies: Secondary | ICD-10-CM

## 2015-07-18 DIAGNOSIS — R740 Nonspecific elevation of levels of transaminase and lactic acid dehydrogenase [LDH]: Secondary | ICD-10-CM

## 2015-07-18 DIAGNOSIS — R42 Dizziness and giddiness: Secondary | ICD-10-CM | POA: Insufficient documentation

## 2015-07-18 DIAGNOSIS — C8331 Diffuse large B-cell lymphoma, lymph nodes of head, face, and neck: Secondary | ICD-10-CM | POA: Insufficient documentation

## 2015-07-18 DIAGNOSIS — Z452 Encounter for adjustment and management of vascular access device: Secondary | ICD-10-CM | POA: Insufficient documentation

## 2015-07-18 DIAGNOSIS — Z8579 Personal history of other malignant neoplasms of lymphoid, hematopoietic and related tissues: Secondary | ICD-10-CM | POA: Diagnosis not present

## 2015-07-18 DIAGNOSIS — I10 Essential (primary) hypertension: Secondary | ICD-10-CM | POA: Insufficient documentation

## 2015-07-18 DIAGNOSIS — Z9221 Personal history of antineoplastic chemotherapy: Secondary | ICD-10-CM | POA: Diagnosis not present

## 2015-07-18 DIAGNOSIS — N3281 Overactive bladder: Secondary | ICD-10-CM | POA: Insufficient documentation

## 2015-07-18 DIAGNOSIS — R7989 Other specified abnormal findings of blood chemistry: Secondary | ICD-10-CM

## 2015-07-18 DIAGNOSIS — I4891 Unspecified atrial fibrillation: Secondary | ICD-10-CM | POA: Diagnosis not present

## 2015-07-18 DIAGNOSIS — M2040 Other hammer toe(s) (acquired), unspecified foot: Secondary | ICD-10-CM | POA: Insufficient documentation

## 2015-07-18 DIAGNOSIS — C8591 Non-Hodgkin lymphoma, unspecified, lymph nodes of head, face, and neck: Secondary | ICD-10-CM

## 2015-07-18 DIAGNOSIS — D369 Benign neoplasm, unspecified site: Secondary | ICD-10-CM | POA: Insufficient documentation

## 2015-07-18 LAB — COMPREHENSIVE METABOLIC PANEL
ALK PHOS: 94 U/L (ref 38–126)
ALT: 49 U/L (ref 14–54)
ANION GAP: 8 (ref 5–15)
AST: 45 U/L — ABNORMAL HIGH (ref 15–41)
Albumin: 3.6 g/dL (ref 3.5–5.0)
BILIRUBIN TOTAL: 0.5 mg/dL (ref 0.3–1.2)
BUN: 26 mg/dL — ABNORMAL HIGH (ref 6–20)
CALCIUM: 9.2 mg/dL (ref 8.9–10.3)
CO2: 27 mmol/L (ref 22–32)
CREATININE: 1.2 mg/dL — AB (ref 0.44–1.00)
Chloride: 105 mmol/L (ref 101–111)
GFR, EST AFRICAN AMERICAN: 45 mL/min — AB (ref >60)
GFR, EST NON AFRICAN AMERICAN: 39 mL/min — AB (ref >60)
Glucose, Bld: 86 mg/dL (ref 65–99)
Potassium: 4 mmol/L (ref 3.5–5.1)
Sodium: 140 mmol/L (ref 135–145)
TOTAL PROTEIN: 6.7 g/dL (ref 6.5–8.1)

## 2015-07-18 LAB — CBC WITH DIFFERENTIAL/PLATELET
BASOS ABS: 0 10*3/uL (ref 0.0–0.1)
Basophils Relative: 0 %
EOS ABS: 0.2 10*3/uL (ref 0.0–0.7)
EOS PCT: 3 %
HEMATOCRIT: 42.2 % (ref 36.0–46.0)
Hemoglobin: 14.1 g/dL (ref 12.0–15.0)
LYMPHS ABS: 1.1 10*3/uL (ref 0.7–4.0)
LYMPHS PCT: 13 %
MCH: 32.2 pg (ref 26.0–34.0)
MCHC: 33.4 g/dL (ref 30.0–36.0)
MCV: 96.3 fL (ref 78.0–100.0)
MONO ABS: 0.6 10*3/uL (ref 0.1–1.0)
Monocytes Relative: 7 %
Neutro Abs: 6.4 10*3/uL (ref 1.7–7.7)
Neutrophils Relative %: 77 %
Platelets: 203 10*3/uL (ref 150–400)
RBC: 4.38 MIL/uL (ref 3.87–5.11)
RDW: 15.3 % (ref 11.5–15.5)
WBC: 8.3 10*3/uL (ref 4.0–10.5)

## 2015-07-18 LAB — LACTATE DEHYDROGENASE: LDH: 205 U/L — ABNORMAL HIGH (ref 98–192)

## 2015-07-18 NOTE — Progress Notes (Signed)
VYAS,DHRUV B., MD [place] [id number]  Diffuse large B-cell lymphoma, stage I AE vs  II-E, status post 4 cycles of CVP-R. followed by consolidative radiotherapy with primary disease presenting in the nasal cavity and neck. Last chemotherapy treatment was 04/13/2011.   Last PET/CT 09/2011 with no residual or recurrent hypermetabolic adenopathy. Focus within anterior L fifth rib, favored to be interval fracture  Afib on Eliquis  CURRENT THERAPY: Observation  INTERVAL HISTORY: Sierra Good 82 y.o. female returns for follow-up of her lymphoma.  She can no longer walk.  At assisted living she is now completely dependent on them for all care.  She finds this very difficult. She did better when she was in skilled care a year ago.  Over the past year in assisted living she has gradually declined. She resides at [facility] living.  Sierra Good is accompanied by her daughter. She presents in a wheelchair.  Daughter reports that her mother sleeps most of the day and has lost nearly all the muscle in her legs. They have tried physical therapy but "it is just too...". The patient states that she cannot stay awake to read books anymore. She continues to have a good appetite. She is not able to do things on her own and is no longer able to stand. The patient states that she can dress herself, her daughter shook her head in disagreement with this statement.   Her PCP is Dr. Woody Seller is about two minutes away from the assisted living facility she stays at. Dr. Woody Seller has checked her out but cannot help her fatigue.   MEDICAL HISTORY: Past Medical History  Diagnosis Date  . GERD (gastroesophageal reflux disease)        . Hyperlipidemia   . Hypertension   . Osteoarthritis of lumbar spine   . PONV (postoperative nausea and vomiting) 2009    after total hip  . Deviated nasal septum     pt had septoplasty  . Nasal turbinate hypertrophy     bilateral  . Cancer (HCC)     nasal cancer   . Cancer ([place])     cancer in the nose and in lower jaw  . Atrial fibrillation ([place])   . Dizziness   . Itching   . Diffuse large B cell lymphoma (HCC)     Dr. Tressie Stalker -Nasal cavity, right sided lymph adenopathy  . OAB (overactive bladder)   . Tubulovillous adenoma of colon 10/2008    w/ HG dysplasia    has HYPERLIPIDEMIA; Essential hypertension; Atrial fibrillation ([place]); BRADYCARDIA; SYNCOPE, HX OF; Lymphoma of lymph nodes of head, face, and/or neck ([place]); Nausea alone; SOB (shortness of breath) on exertion; Dysphagia; GERD (gastroesophageal reflux disease); Long term (current) use of anticoagulants; Muscle weakness (generalized); Difficulty in walking(719.7); Pacemaker; Encounter for therapeutic drug monitoring; and Elevated LFTs on her problem list.     No history exists.     is allergic to statins.  Sierra Good had no medications administered during this visit.  SURGICAL HISTORY: Past Surgical History  Procedure Laterality Date  . Total hip arthroplasty  2009    Right, [facility] (general)  . Bunionectomy  20+yrs ago    bilateral, [facility]  . Hammer toe surgery      20+ yrs ago  . Cataract extraction w/ intraocular lens  implant, bilateral  2010    [facility]  . Nasal septoplasty w/ turbinoplasty  12/18/2010    Procedure: NASAL SEPTOPLASTY WITH TURBINATE REDUCTION;  Surgeon: Ascencion Dike;  Location: [facility];  Service: ENT;  Laterality: Bilateral;  . Colon surgery  10/29/2008    Ingram-right hemicolectomy for tubulovillous adenoma (3cm) with HG glandular dysplasia  . Portacath placement  02/10/2011    Procedure: INSERTION PORT-A-CATH;  Surgeon: Scherry Ran;  Location: [facility];  Service: General;  Laterality: Left;  left subclavian  . Skin cancer removal  6/13  . Colonoscopy  09/28/2002    RMR: Normal colonic mucosa.  Redundant and elongated but otherwise normal appearing colon/normal rectum  . Colonoscopy  09/11/2008    RMR: Normal rectum/Diminutive sigmoid polyp status post snare removal/   Flat sessile semilunar lesion at and in the ileocecal valve status post hot snare piecemeal debulking as described above  . Colonoscopy  01/06/10    Rourk-normal rectum, normal residual colon status post right hemicolectomy. Next colonoscopy in 01/2015 health permiting  . Esophagogastroduodenoscopy (egd) with esophageal dilation  05/23/2012    [id number] ring-dilated as described above. Hiatal hernia  . Esophagogastroduodenoscopy Left 03/06/2013    Procedure: ESOPHAGOGASTRODUODENOSCOPY (EGD);  Surgeon: Jerene Bears, MD;  Location: [place];  Service: Gastroenterology;  Laterality: Left;  . Pacemaker insertion Right 02/16/2013  . Port-a-cath removal Left 02/16/2013  . Permanent pacemaker insertion N/A 02/16/2013    Procedure: PERMANENT PACEMAKER INSERTION;  Surgeon: Evans Lance, MD;  Location: [facility] CATH LAB;  Service: Cardiovascular;  Laterality: N/A;    SOCIAL HISTORY: Social History   Social History  . Marital Status: Widowed    Spouse Name: N/A  . Number of Children: N/A  . Years of Education: N/A   Occupational History  . Not on file.   Social History Main Topics  . Smoking status: Never Smoker   . Smokeless tobacco: Never Used  . Alcohol Use: No  . Drug Use: No  . Sexual Activity: Yes    Birth Control/ Protection: Post-menopausal   Other Topics Concern  . Not on file   Social History Narrative    FAMILY HISTORY: Family History  Problem Relation Age of Onset  . Pseudochol deficiency Neg Hx   . Malignant hyperthermia Neg Hx   . Hypotension Neg Hx   . Anesthesia problems Neg Hx   . Cancer Mother   . Stroke Father     Review of Systems  Constitutional: Positive for malaise/fatigue. Negative for fever, chills, weight loss.       Spends most of her time sleeping. HENT: Negative for congestion, hearing loss, nosebleeds, sore throat and tinnitus.   Eyes: Negative for blurred vision, double vision, pain and discharge.  Respiratory: Negative for cough, hemoptysis,  sputum production, shortness of breath and wheezing.   Cardiovascular: Negative for chest pain, palpitations, claudication, leg swelling and PND.  Gastrointestinal: Negative for heartburn, nausea, vomiting, abdominal pain, diarrhea, constipation, blood in stool and melena.  Genitourinary: Negative for dysuria and hematuria.  Musculoskeletal:. Negative for myalgias and joint pain.       Unable to ambulate. Can no longer use her walker.  Skin: Negative for itching and rash.  Neurological: Negative for dizziness, tingling, tremors, sensory change, speech change, focal weakness, seizures, loss of consciousness, weakness and headaches.  Endo/Heme/Allergies: Does not bruise/bleed easily.  Psychiatric/Behavioral: Negative for depression, suicidal ideas, memory loss and substance abuse. The patient is not nervous/anxious and does not have insomnia.   14 point review of systems was performed and is negative except as detailed under history of present illness and above   PHYSICAL EXAMINATION  ECOG PERFORMANCE  STATUS: 2 - Symptomatic, <50% confined to bed  Note, not from her malignancy.   Filed Vitals:   07/18/15 1037  BP: 134/62  Pulse: 62  Temp: 98.8 F (37.1 C)  Resp: 18    Physical Exam  Constitutional: She is oriented to person, place, and time.  In wheelchair, pleasant, well groomed.  She is wearing support stockings.  HENT:  Head: Normocephalic and atraumatic.  Mouth/Throat: No oropharyngeal exudate.  Eyes: Conjunctivae and EOM are normal. Right eye exhibits no discharge. No scleral icterus.  Neck: Normal range of motion. Neck supple. No thyromegaly present.  Cardiovascular: Normal heart sounds.   Rate controlled Pulmonary/Chest: Effort normal and breath sounds normal. No respiratory distress. She has no wheezes.  Abdominal: Soft. Bowel sounds are normal. She exhibits no distension. There is no tenderness. There is no rebound and no guarding.  Musculoskeletal: Normal range of  motion. She exhibits no edema or tenderness.  Cannot stand  Lymphadenopathy:    She has no cervical adenopathy.  No appreciable adenopathy  Neurological: She is alert and oriented to person, place, and time. No cranial nerve deficit.  Skin: Skin is warm and dry.  Psychiatric: Mood, memory, affect and judgment normal.    LABORATORY DATA: I have reviewed the data as listed. CBC    Component Value Date/Time   WBC 8.3 07/18/2015 0936   RBC 4.38 07/18/2015 0936   RBC 4.53 05/18/2013 0944   HGB 14.1 07/18/2015 0936   HCT 42.2 07/18/2015 0936   HCT 47 03/21/2012 0826   PLT 203 07/18/2015 0936   MCV 96.3 07/18/2015 0936   MCH 32.2 07/18/2015 0936   MCHC 33.4 07/18/2015 0936   RDW 15.3 07/18/2015 0936   LYMPHSABS 1.1 07/18/2015 0936   MONOABS 0.6 07/18/2015 0936   EOSABS 0.2 07/18/2015 0936   BASOSABS 0.0 07/18/2015 0936   CMP     Component Value Date/Time   NA 140 07/18/2015 0936   NA 141 03/21/2012 0828   K 4.0 07/18/2015 0936   K 4.0 03/21/2012 0828   CL 105 07/18/2015 0936   CL 100 03/21/2012 0828   CO2 27 07/18/2015 0936   GLUCOSE 86 07/18/2015 0936   BUN 26* 07/18/2015 0936   BUN 14 03/21/2012 0828   CREATININE 1.20* 07/18/2015 0936   CREATININE 0.87 03/21/2012 0828   CALCIUM 9.2 07/18/2015 0936   CALCIUM 10.4 03/21/2012 0828   PROT 6.7 07/18/2015 0936   PROT 6.6 03/21/2012 0828   ALBUMIN 3.6 07/18/2015 0936   ALBUMIN 4.5 03/21/2012 0828   AST 45* 07/18/2015 0936   AST 17 03/21/2012 0828   ALT 49 07/18/2015 0936   ALKPHOS 94 07/18/2015 0936   ALKPHOS 100 03/21/2012 0828   BILITOT 0.5 07/18/2015 0936   BILITOT 0.3 03/21/2012 0828   GFRNONAA 39* 07/18/2015 0936   GFRAA 45* 07/18/2015 0936     ASSESSMENT and THERAPY PLAN:  Diffuse large B-cell lymphoma, stage I AE vs  II-E, status post 4 cycles of CVP-R. followed by consolidative radiotherapy with primary disease presenting in the nasal cavity and neck. Last chemotherapy treatment was  04/13/2011.  Transaminitis  Realistically she is at her baseline.   From the perspective of her lymphoma she does not have obvious evidence of recurrence. The patient has severe fatigue, spending most of her days sleeping. She continues to have a good appetite.  She continues to follow with cardiology.  She will continue to follow up with her PCP, Dr. Woody Seller, regularly. Given her limited  mobility, after a long discussion the patient and her daughter agreed with follow-up here on an as needed basis.   All questions were answered. The patient knows to call the clinic with any problems, questions or concerns. We can certainly see the patient much sooner if necessary.   This document serves as a record of services personally performed by Ancil Linsey, MD. It was created on her behalf by Arlyce Harman, a trained medical scribe. The creation of this record is based on the scribe's personal observations and the provider's statements to them. This document has been checked and approved by the attending provider.  I have reviewed the above documentation for accuracy and completeness, and I agree with the above.  This note was electronically signed.  Kelby Fam. Whitney Muse, MD

## 2015-07-18 NOTE — Patient Instructions (Signed)
[place] at [facility] Discharge Instructions  RECOMMENDATIONS MADE BY THE CONSULTANT AND ANY TEST RESULTS WILL BE SENT TO YOUR REFERRING PHYSICIAN.   Exam and discussion by Dr Whitney Muse today Follow up with Dr Woody Seller  Return to see the doctor as needed Please call the clinic if you have any questions or concerns    Thank you for choosing [place] at [facility] to provide your oncology and hematology care.  To afford each patient quality time with our provider, please arrive at least 15 minutes before your scheduled appointment time.   Beginning January 23rd 2017 lab work for the [facility] will be done in the  Main lab at [facility] on 1st floor. If you have a lab appointment with the [place] please come in thru the  Main Entrance and check in at the main information desk  You need to re-schedule your appointment should you arrive 10 or more minutes late.  We strive to give you quality time with our providers, and arriving late affects you and other patients whose appointments are after yours.  Also, if you no show three or more times for appointments you may be dismissed from the clinic at the providers discretion.     Again, thank you for choosing [facility].  Our hope is that these requests will decrease the amount of time that you wait before being seen by our physicians.       _____________________________________________________________  Should you have questions after your visit to [facility], please contact our office at [phone] between the hours of 8:30 a.m. and 4:30 p.m.  Voicemails left after 4:30 p.m. will not be returned until the following business day.  For prescription refill requests, have your pharmacy contact our office.

## 2015-08-08 ENCOUNTER — Ambulatory Visit (INDEPENDENT_AMBULATORY_CARE_PROVIDER_SITE_OTHER): Payer: Medicare Other | Admitting: *Deleted

## 2015-08-08 DIAGNOSIS — I495 Sick sinus syndrome: Secondary | ICD-10-CM | POA: Diagnosis not present

## 2015-08-08 NOTE — Progress Notes (Signed)
Remote pacemaker transmission.   

## 2015-08-16 LAB — CUP PACEART REMOTE DEVICE CHECK
Battery Impedance: 204 Ohm
Battery Voltage: 2.79 V
Brady Statistic AP VS Percent: 79 %
Brady Statistic AS VP Percent: 0 %
Implantable Lead Implant Date: 20140911
Implantable Lead Location: 753860
Implantable Lead Model: 5076
Lead Channel Setting Pacing Amplitude: 2 V
Lead Channel Setting Pacing Pulse Width: 0.4 ms
MDC IDC LEAD IMPLANT DT: 20140911
MDC IDC LEAD LOCATION: 753859
MDC IDC MSMT BATTERY REMAINING LONGEVITY: 109 mo
MDC IDC MSMT LEADCHNL RA IMPEDANCE VALUE: 698 Ohm
MDC IDC MSMT LEADCHNL RV IMPEDANCE VALUE: 443 Ohm
MDC IDC SESS DTM: 20170302132540
MDC IDC SET LEADCHNL RV PACING AMPLITUDE: 2.5 V
MDC IDC SET LEADCHNL RV SENSING SENSITIVITY: 5.6 mV
MDC IDC STAT BRADY AP VP PERCENT: 20 %
MDC IDC STAT BRADY AS VS PERCENT: 0 %

## 2015-08-16 NOTE — Progress Notes (Signed)
Normal remote reviewed.  Next [facility] 11/07/15

## 2015-08-21 ENCOUNTER — Encounter: Payer: Self-pay | Admitting: Cardiology

## 2015-09-04 ENCOUNTER — Encounter: Payer: Self-pay | Admitting: Cardiology

## 2015-10-28 ENCOUNTER — Ambulatory Visit (INDEPENDENT_AMBULATORY_CARE_PROVIDER_SITE_OTHER): Payer: Medicare Other | Admitting: Nurse Practitioner

## 2015-10-28 ENCOUNTER — Encounter: Payer: Self-pay | Admitting: Nurse Practitioner

## 2015-10-28 VITALS — BP 120/68 | HR 60 | Temp 98.0°F

## 2015-10-28 DIAGNOSIS — K219 Gastro-esophageal reflux disease without esophagitis: Secondary | ICD-10-CM | POA: Diagnosis not present

## 2015-10-28 DIAGNOSIS — R1310 Dysphagia, unspecified: Secondary | ICD-10-CM

## 2015-10-28 NOTE — Assessment & Plan Note (Signed)
Admits significant GERD symptoms on other PPIs. Dexilant is the only PPI that seems to work well, however insurance has required her to stop it and try other agents. Exacerbations of her GERD on other agents likely contributing to her issues. She is back on Dexilant now. I told the family to notify us if there are significant pushed back again and we can appeal to the insurance company. Return for follow-up in 3 months.

## 2015-10-28 NOTE — Progress Notes (Signed)
CC'D TO PCP °

## 2015-10-28 NOTE — Assessment & Plan Note (Signed)
Patient with dysphagia symptoms. Seems to do better on Dexilant. Given her age and multiple comorbidities she is likely a high risk candidate for EGD and possible dilation. Discussed options with patient and her daughter including barium pill esophagram, diet recommendations, speech therapy evaluation. After discussion we decided on continued Dexilant, change from regular diet to dysphagia 3 diet and see how she does. We will leave open the option for speech therapy evaluation with further recommendations as well as possible barium pill esophagram in the future. Return for follow-up in 3 months.  The patient is to call us if insurance continues to push back on Dexilant. She has tried and failed 3 other PPIs and at this point [place] is the only thing that seems to work well. GERD exacerbation on other agents likely contributing to worsening dysphagia.

## 2015-10-28 NOTE — Patient Instructions (Addendum)
1. Continue taking Dexilant. 2. We will provide additional information related to GERD control. 3. Change diet to dysphagia 3. 4. Return for follow-up in 3 months. 5. Notify us if continued pushed back by insurance company related to [place]    Gastroesophageal Reflux Disease, Adult Normally, food travels down the esophagus and stays in the stomach to be digested. However, when a person has gastroesophageal reflux disease (GERD), food and stomach acid move back up into the esophagus. When this happens, the esophagus becomes sore and inflamed. Over time, GERD can create small holes (ulcers) in the lining of the esophagus.  CAUSES This condition is caused by a problem with the muscle between the esophagus and the stomach (lower esophageal sphincter, or LES). Normally, the LES muscle closes after food passes through the esophagus to the stomach. When the LES is weakened or abnormal, it does not close properly, and that allows food and stomach acid to go back up into the esophagus. The LES can be weakened by certain dietary substances, medicines, and medical conditions, including:  Tobacco use.  Pregnancy.  Having a hiatal hernia.  Heavy alcohol use.  Certain foods and beverages, such as coffee, chocolate, onions, and peppermint. RISK FACTORS This condition is more likely to develop in:  People who have an increased body weight.  People who have connective tissue disorders.  People who use NSAID medicines. SYMPTOMS Symptoms of this condition include:  Heartburn.  Difficult or painful swallowing.  The feeling of having a lump in the throat.  Abitter taste in the mouth.  Bad breath.  Having a large amount of saliva.  Having an upset or bloated stomach.  Belching.  Chest pain.  Shortness of breath or wheezing.  Ongoing (chronic) cough or a night-time cough.  Wearing away of tooth enamel.  Weight loss. Different conditions can cause chest pain. Make sure to see  your health care provider if you experience chest pain. DIAGNOSIS Your health care provider will take a medical history and perform a physical exam. To determine if you have mild or severe GERD, your health care provider may also monitor how you respond to treatment. You may also have other tests, including:  An endoscopy toexamine your stomach and esophagus with a small camera.  A test thatmeasures the acidity level in your esophagus.  A test thatmeasures how much pressure is on your esophagus.  A barium swallow or modified barium swallow to show the shape, size, and functioning of your esophagus. TREATMENT The goal of treatment is to help relieve your symptoms and to prevent complications. Treatment for this condition may vary depending on how severe your symptoms are. Your health care provider may recommend:  Changes to your diet.  Medicine.  Surgery. HOME CARE INSTRUCTIONS Diet  Follow a diet as recommended by your health care provider. This may involve avoiding foods and drinks such as:  Coffee and tea (with or without caffeine).  Drinks that containalcohol.  Energy drinks and sports drinks.  Carbonated drinks or sodas.  Chocolate and cocoa.  Peppermint and mint flavorings.  Garlic and onions.  Horseradish.  Spicy and acidic foods, including peppers, chili powder, curry powder, vinegar, hot sauces, and barbecue sauce.  Citrus fruit juices and citrus fruits, such as oranges, lemons, and limes.  Tomato-based foods, such as red sauce, chili, salsa, and pizza with red sauce.  Fried and fatty foods, such as donuts, french fries, potato chips, and high-fat dressings.  High-fat meats, such as hot dogs and fatty cuts of  red and white meats, such as rib eye steak, sausage, ham, and bacon.  High-fat dairy items, such as whole milk, butter, and cream cheese.  Eat small, frequent meals instead of large meals.  Avoid drinking large amounts of liquid with your  meals.  Avoid eating meals during the 2-3 hours before bedtime.  Avoid lying down right after you eat.  Do not exercise right after you eat. General Instructions  Pay attention to any changes in your symptoms.  Take over-the-counter and prescription medicines only as told by your health care provider. Do not take aspirin, ibuprofen, or other NSAIDs unless your health care provider told you to do so.  Do not use any tobacco products, including cigarettes, chewing tobacco, and e-cigarettes. If you need help quitting, ask your health care provider.  Wear loose-fitting clothing. Do not wear anything tight around your waist that causes pressure on your abdomen.  Raise (elevate) the head of your bed 6 inches (15cm).  Try to reduce your stress, such as with yoga or meditation. If you need help reducing stress, ask your health care provider.  If you are overweight, reduce your weight to an amount that is healthy for you. Ask your health care provider for guidance about a safe weight loss goal.  Keep all follow-up visits as told by your health care provider. This is important. SEEK MEDICAL CARE IF:  You have new symptoms.  You have unexplained weight loss.  You have difficulty swallowing, or it hurts to swallow.  You have wheezing or a persistent cough.  Your symptoms do not improve with treatment.  You have a hoarse voice. SEEK IMMEDIATE MEDICAL CARE IF:  You have pain in your arms, neck, jaw, teeth, or back.  You feel sweaty, dizzy, or light-headed.  You have chest pain or shortness of breath.  You vomit and your vomit looks like blood or coffee grounds.  You faint.  Your stool is bloody or black.  You cannot swallow, drink, or eat.   This information is not intended to replace advice given to you by your health care provider. Make sure you discuss any questions you have with your health care provider.   Document Released: 03/04/2005 Document Revised: 02/13/2015  Document Reviewed: 09/19/2014 Elsevier Interactive Patient Education 2016 [place] for Gastroesophageal Reflux Disease, Adult When you have gastroesophageal reflux disease (GERD), the foods you eat and your eating habits are very important. Choosing the right foods can help ease the discomfort of GERD. WHAT GENERAL GUIDELINES DO I NEED TO FOLLOW?  Choose fruits, vegetables, whole grains, low-fat dairy products, and low-fat meat, fish, and poultry.  Limit fats such as oils, salad dressings, butter, nuts, and avocado.  Keep a food diary to identify foods that cause symptoms.  Avoid foods that cause reflux. These may be different for different people.  Eat frequent small meals instead of three large meals each day.  Eat your meals slowly, in a relaxed setting.  Limit fried foods.  Cook foods using methods other than frying.  Avoid drinking alcohol.  Avoid drinking large amounts of liquids with your meals.  Avoid bending over or lying down until 2-3 hours after eating. WHAT FOODS ARE NOT RECOMMENDED? The following are some foods and drinks that may worsen your symptoms: Vegetables Tomatoes. Tomato juice. Tomato and spaghetti sauce. Chili peppers. Onion and garlic. Horseradish. Fruits Oranges, grapefruit, and lemon (fruit and juice). Meats High-fat meats, fish, and poultry. This includes hot dogs, ribs,  ham, sausage, salami, and bacon. Dairy Whole milk and chocolate milk. Sour cream. Cream. Butter. Ice cream. Cream cheese.  Beverages Coffee and tea, with or without caffeine. Carbonated beverages or energy drinks. Condiments Hot sauce. Barbecue sauce.  Sweets/Desserts Chocolate and cocoa. Donuts. Peppermint and spearmint. Fats and Oils High-fat foods, including [place] fries and potato chips. Other Vinegar. Strong spices, such as black pepper, white pepper, red pepper, cayenne, curry powder, cloves, ginger, and chili powder. The items listed above  may not be a complete list of foods and beverages to avoid. Contact your dietitian for more information.   This information is not intended to replace advice given to you by your health care provider. Make sure you discuss any questions you have with your health care provider.   Document Released: 05/25/2005 Document Revised: 06/15/2014 Document Reviewed: 03/29/2013 Elsevier Interactive Patient Education 2016 Elsevier Inc[employer]      Dysphagia Diet Level 3, Mechanically Advanced The dysphagia level 3 diet includes foods that are soft, moist, and can be chopped into 1-inch chunks. This diet is helpful for people with mild swallowing difficulties. It reduces the risk of food getting caught in the windpipe, trachea, or lungs. WHAT DO I NEED TO KNOW ABOUT THIS DIET?  You may eat foods that are soft and moist.  If you were on the dysphagia level 1 or level 2 diets, you may eat any of the foods included on those lists.  Avoid foods that are dry, hard, sticky, chewy, coarse, and crunchy. Also avoid large cuts of food.  Take small bites. Each bite should contain 1 inch or less of food.  Thicken liquids if instructed by your health care provider. Follow your health care provider's instructions on how to do this and to what consistency.  See your dietitian or speech language pathologist regularly for help with your dietary changes. WHAT FOODS CAN I EAT? Grains Moist breads without nuts or seeds. Biscuits, muffins, pancakes, and waffles well-moistened with syrup, jelly, margarine, or butter. Smooth cereals with plenty of milk to moisten them. Moist bread stuffing. Moist rice. Vegetables All cooked, soft vegetables. Shredded lettuce. Tender fried potatoes. Fruits All canned and cooked fruits. Soft, peeled fresh fruits, such as peaches, nectarines, kiwis, cantaloupe, honeydew melon, and watermelon without seeds. Soft berries, such as strawberries. Meat and Other Protein Sources Moist ground or finely  diced or sliced meats. Solid, tender cuts of meat. Meatloaf. Hamburger with a bun. Sausage patty. Deli thin-sliced lunch meat. Chicken, egg, or tuna salad sandwich. Sloppy joe. Moist fish. Eggs prepared any way. Casseroles with small chunks of meats, ground meats, or tender meats. Dairy Cheese spreads without coarse large chunks. Shredded cheese. Cheese slices. Cottage cheese. Milk at the right texture. Smooth frappes. Yogurt without nuts or coconut. Ask your health care provider whether you can have frozen desserts (such as malts or milk shakes) and thin liquids. Sweets/Desserts Soft, smooth, moist desserts. Non-chewy, smooth candy. Jam. Jelly. Honey. Preserves. Ask your health care provider whether you can have frozen desserts. Fats and Oils Butter. Oils. Margarine. Mayonnaise. Gravy. Spreads. Other All seasonings and sweeteners. All sauces without large chunks. The items listed above may not be a complete list of recommended foods or beverages. Contact your dietitian for more options. WHAT FOODS ARE NOT RECOMMENDED? Grains Coarse or dry cereals. Dry breads. Toast. Crackers. Tough, crusty breads, such French bread and baguettes. Tough, crisp fried potatoes. Potato skins. Dry bread stuffing. Granola. Popcorn. Chips. Vegetables All raw vegetables except shredded lettuce. Cooked corn. Rubbery  or stiff cooked vegetables. Stringy vegetables, such as celery. Fruits Hard fruits that are difficult to chew, such as apples or pears. Stringy, high-pulp fruits, such as pineapple, papaya, or mango. Fruits with tough skins, such as grapes. Coconut. All dried fruits. Fruit leather. Fruit roll-ups. Fruit snacks. Meat and Other Protein Sources Dry or tough meats or poultry. Dry fish. Fish with bones. Peanut butter. All nuts and seeds. Dairy  Any with nuts, seeds, chocolate chips, dried fruit, coconut, or pineapple. Sweets/Desserts Dry cakes. Chewy or dry cookies. Any with nuts, seeds, dry fruits, coconut,  pineapple, or anything dry, sticky, or hard. Chewy caramel. Licorice. Taffy-type candies. Ask your health care provider whether you can have frozen desserts. Fats and Oils Any with chunks, nuts, seeds, or pineapple. Olives. Angie Fava. Other Soups with tough or large chunks of meats, poultry, or vegetables. Corn or clam chowder. The items listed above may not be a complete list of foods and beverages to avoid. Contact your dietitian for more information.   This information is not intended to replace advice given to you by your health care provider. Make sure you discuss any questions you have with your health care provider.   Document Released: 05/25/2005 Document Revised: 06/15/2014 Document Reviewed: 05/08/2013 Elsevier Interactive Patient Education [employer].

## 2015-10-28 NOTE — Progress Notes (Signed)
Referring Provider: Glenda Chroman, MD Primary Care Physician:  Glenda Chroman, MD Primary GI:  Dr. Gala Romney  Chief Complaint  Patient presents with  . Follow-up    HPI:   Sierra Good is a 82 y.o. female who presents for follow-up on . Asked office visit 05/27/2015 for elevated LFTs at which point she had some improvement. Her last labs were drawn on 07/18/2015 which showed AST 45, ALT 49. 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 Completed chemotherapy and radiation in 2012. Last PET/CT on 09/26/2011 with no residual or recurrent disease noted. Per oncology notes, patient seems to be in a bit of a decline with ADLs and fatigue. Resides at assisted living facility. At the time of her last oncology visit she was noted to realistically be at her baseline, sleeps a lot, continues to have a good appetite. Recommended oncology follow-up on an as-needed basis due to limited mobility.  Today she is accompanied by her daughter. The patient states she occasionally has regurgitation vs vomiting of bile-like material. Has had a Schatzki's ring in the past with dilation. EGD in 2014 noted ring which was widely open, hiatal hernia 3 cm without Cameron's ulcers. Typically has symptoms once every 2-3 weeks. Daughter states hasn't been as often as previously. Her daughter states the facility allows her to take as much time as needed. She is on a regular diet. Patient notes a lot of heartburn, better on Dexilant. Appetitie good per daughter. However, Insurance has given them problems about paying for it despite trial/fail of multiple other agents. Denies any other upper or lower GI symptoms.  She has tried and failed 3 other PPIs (omeprazole, pantoprazole, and lansoprazole).  Past Medical History  Diagnosis Date  . GERD (gastroesophageal reflux disease)        . Hyperlipidemia   . Hypertension   . Osteoarthritis of lumbar spine   .  PONV (postoperative nausea and vomiting) 2009    after total hip  . Deviated nasal septum     pt had septoplasty  . Nasal turbinate hypertrophy     bilateral  . Cancer (HCC)     nasal cancer  . Cancer ([place])     cancer in the nose and in lower jaw  . Atrial fibrillation ([place])   . Dizziness   . Itching   . Diffuse large B cell lymphoma (HCC)     Dr. Tressie Stalker -Nasal cavity, right sided lymph adenopathy  . OAB (overactive bladder)   . 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 Total hip arthroplasty  2009    Right, [facility] (general)  . Bunionectomy  20+yrs ago    bilateral, [facility]  . Hammer toe surgery      20+ yrs ago  . Cataract extraction w/ intraocular lens  implant, bilateral  2010    [facility]  . Nasal septoplasty w/ turbinoplasty  12/18/2010    Procedure: NASAL SEPTOPLASTY WITH TURBINATE REDUCTION;  Surgeon: Ascencion Dike;  Location: [facility];  Service: ENT;  Laterality: Bilateral;  . Colon surgery  10/29/2008    Ingram-right hemicolectomy for tubulovillous adenoma (3cm) with HG glandular dysplasia  . Portacath placement  02/10/2011    Procedure: INSERTION PORT-A-CATH;  Surgeon: Scherry Ran;  Location: [facility];  Service: General;  Laterality: Left;  left subclavian  .  Skin cancer removal  6/13  . Colonoscopy  09/28/2002    RMR: Normal colonic mucosa.  Redundant and elongated but otherwise normal appearing colon/normal rectum  . Colonoscopy  09/11/2008    RMR: Normal rectum/Diminutive sigmoid polyp status post snare removal/  Flat sessile semilunar lesion at and in the ileocecal valve status post hot snare piecemeal debulking as described above  . Colonoscopy  01/06/10    Rourk-normal rectum, normal residual colon status post right hemicolectomy. Next colonoscopy in 01/2015 health permiting  . Esophagogastroduodenoscopy (egd) with esophageal dilation  05/23/2012    [id number] ring-dilated as described above. Hiatal  hernia  . Esophagogastroduodenoscopy Left 03/06/2013    Procedure: ESOPHAGOGASTRODUODENOSCOPY (EGD);  Surgeon: Jerene Bears, MD;  Location: [facility];  Service: Gastroenterology;  Laterality: Left;  . Pacemaker insertion Right 02/16/2013  . Port-a-cath removal Left 02/16/2013  . Permanent pacemaker insertion N/A 02/16/2013    Procedure: PERMANENT PACEMAKER INSERTION;  Surgeon: Evans Lance, MD;  Location: [facility] CATH LAB;  Service: Cardiovascular;  Laterality: N/A;    Current Outpatient Prescriptions  Medication Sig Dispense Refill  . acetaminophen (TYLENOL) 500 MG tablet Take 500 mg by mouth every 8 (eight) hours as needed.    Marland Kitchen amiodarone (PACERONE) 200 MG tablet Take 1 tablet (200 mg total) by mouth daily.    . Calcium-Vitamin D (CALTRATE 600 PLUS-VIT D PO) Take 1 tablet by mouth daily.    . cholecalciferol (VITAMIN D) 1000 UNITS tablet Take 1,000 Units by mouth daily.    . citalopram (CELEXA) 10 MG tablet Take 10 mg by mouth at bedtime.    Marland Kitchen dexlansoprazole (DEXILANT) 60 MG capsule Take 60 mg by mouth daily.    Marland Kitchen ELIQUIS 5 MG TABS tablet Take 1 tablet by mouth daily. At 5:00 pm daily    . Multiple Vitamins-Minerals (CENTRUM SILVER ULTRA WOMENS) TABS Take 1 tablet by mouth daily.      Marland Kitchen oxybutynin (DITROPAN) 5 MG tablet Take 5 mg by mouth daily.      No current facility-administered medications for this visit.   Facility-Administered Medications Ordered in Other Visits  Medication Dose Route Frequency Provider Last Rate Last Dose  . heparin lock flush 100 unit/mL  500 Units Intravenous Once Baird Cancer, PA-C      . sodium chloride 0.9 % injection 10 mL  10 mL Intravenous PRN Baird Cancer, PA-C        Allergies as of 10/28/2015 - Review Complete 10/28/2015  Allergen Reaction Noted  . Statins Other (See Comments) 12/12/2010    Family History  Problem Relation Age of Onset  . Pseudochol deficiency Neg Hx   . Malignant hyperthermia Neg Hx   . Hypotension Neg Hx   .  Anesthesia problems Neg Hx   . Cancer Mother   . Stroke Father     Social History   Social History  . Marital Status: Widowed    Spouse Name: N/A  . Number of Children: N/A  . Years of Education: N/A   Social History Main Topics  . Smoking status: Never Smoker   . Smokeless tobacco: Never Used  . Alcohol Use: No  . Drug Use: No  . Sexual Activity: Yes    Birth Control/ Protection: Post-menopausal   Other Topics Concern  . None   Social History Narrative    Review of Systems: General: Negative for anorexia, weight loss, fever, chills. ENT: Negative for hoarseness.. CV: Negative for chest pain, angina, peripheral edema.  Respiratory: Negative for  dyspnea at rest, cough, sputum, wheezing.  GI: See history of present illness. Endo: Negative for unusual weight change.   Physical Exam: BP 120/68 mmHg  Pulse 60  Temp(Src) 98 F (36.7 C) (Oral) General:   Alert and oriented. Pleasant and cooperative. Well-nourished and well-developed. Seems tired. Eyes:  Without icterus, sclera clear and conjunctiva pink.  Cardiovascular:  S1, S2 present without murmurs appreciated. Extremities without clubbing or edema. Respiratory:  Clear to auscultation bilaterally. No wheezes, rales, or rhonchi. No distress.  Gastrointestinal:  +BS, soft, non-tender and non-distended. No HSM noted. No guarding or rebound. Rectal:  Deferred  Musculoskalatal:  Symmetrical without gross deformities. In wheelchair. Neurologic:  Alert and oriented x4;  grossly normal neurologically. Psych:  Alert and cooperative. Normal mood and affect. Heme/Lymph/Immune: No excessive bruising noted.    10/28/2015 9:30 AM   Disclaimer: This note was dictated with voice recognition software. Similar sounding words can inadvertently be transcribed and may not be corrected upon review.

## 2015-11-07 ENCOUNTER — Ambulatory Visit (INDEPENDENT_AMBULATORY_CARE_PROVIDER_SITE_OTHER): Payer: Medicare Other | Admitting: *Deleted

## 2015-11-07 ENCOUNTER — Telehealth: Payer: Self-pay | Admitting: Cardiology

## 2015-11-07 DIAGNOSIS — I495 Sick sinus syndrome: Secondary | ICD-10-CM | POA: Diagnosis not present

## 2015-11-07 NOTE — Telephone Encounter (Signed)
LMOVM reminding pt to send remote transmission.   

## 2015-11-07 NOTE — Progress Notes (Signed)
Remote pacemaker transmission.   

## 2015-11-21 LAB — CUP PACEART REMOTE DEVICE CHECK
Battery Voltage: 2.79 V
Brady Statistic AP VS Percent: 78 %
Brady Statistic AS VP Percent: 0 %
Date Time Interrogation Session: 20170601170151
Implantable Lead Implant Date: 20140911
Implantable Lead Implant Date: 20140911
Implantable Lead Location: 753859
Implantable Lead Model: 5076
Lead Channel Impedance Value: 650 Ohm
Lead Channel Setting Pacing Amplitude: 2 V
Lead Channel Setting Pacing Amplitude: 2.5 V
Lead Channel Setting Pacing Pulse Width: 0.4 ms
MDC IDC LEAD LOCATION: 753860
MDC IDC MSMT BATTERY IMPEDANCE: 204 Ohm
MDC IDC MSMT BATTERY REMAINING LONGEVITY: 108 mo
MDC IDC MSMT LEADCHNL RV IMPEDANCE VALUE: 452 Ohm
MDC IDC SET LEADCHNL RV SENSING SENSITIVITY: 5.6 mV
MDC IDC STAT BRADY AP VP PERCENT: 22 %
MDC IDC STAT BRADY AS VS PERCENT: 0 %

## 2015-11-27 ENCOUNTER — Encounter: Payer: Self-pay | Admitting: Cardiology

## 2015-12-12 ENCOUNTER — Encounter: Payer: Self-pay | Admitting: Cardiology

## 2016-01-28 ENCOUNTER — Ambulatory Visit: Payer: Self-pay | Admitting: Nurse Practitioner

## 2016-02-01 ENCOUNTER — Encounter: Payer: Self-pay | Admitting: Internal Medicine

## 2016-02-03 ENCOUNTER — Ambulatory Visit (HOSPITAL_COMMUNITY)
Admission: RE | Admit: 2016-02-03 | Discharge: 2016-02-03 | Disposition: A | Discharge status: Home / Self Care | Payer: Medicare Other | Source: Ambulatory Visit | Attending: Internal Medicine | Admitting: Internal Medicine

## 2016-02-03 ENCOUNTER — Other Ambulatory Visit (HOSPITAL_COMMUNITY): Payer: Self-pay | Admitting: Internal Medicine

## 2016-02-03 DIAGNOSIS — M549 Dorsalgia, unspecified: Secondary | ICD-10-CM | POA: Diagnosis present

## 2016-02-03 DIAGNOSIS — Z96641 Presence of right artificial hip joint: Secondary | ICD-10-CM | POA: Diagnosis not present

## 2016-02-03 DIAGNOSIS — M25551 Pain in right hip: Secondary | ICD-10-CM

## 2016-02-03 DIAGNOSIS — M25559 Pain in unspecified hip: Secondary | ICD-10-CM | POA: Diagnosis present

## 2016-02-03 DIAGNOSIS — Z9889 Other specified postprocedural states: Secondary | ICD-10-CM | POA: Insufficient documentation

## 2016-02-03 DIAGNOSIS — M5136 Other intervertebral disc degeneration, lumbar region: Secondary | ICD-10-CM | POA: Diagnosis not present

## 2016-02-03 DIAGNOSIS — M25552 Pain in left hip: Secondary | ICD-10-CM

## 2016-02-03 DIAGNOSIS — M545 Low back pain: Secondary | ICD-10-CM

## 2016-02-12 ENCOUNTER — Encounter (HOSPITAL_COMMUNITY): Payer: Self-pay | Admitting: Emergency Medicine

## 2016-02-12 ENCOUNTER — Emergency Department (HOSPITAL_COMMUNITY): Payer: Medicare Other

## 2016-02-12 ENCOUNTER — Emergency Department (HOSPITAL_COMMUNITY)
Admission: EM | Admit: 2016-02-12 | Discharge: 2016-02-12 | Disposition: A | Discharge status: Home / Self Care | Payer: Medicare Other | Attending: Emergency Medicine | Admitting: Emergency Medicine

## 2016-02-12 DIAGNOSIS — M549 Dorsalgia, unspecified: Secondary | ICD-10-CM | POA: Insufficient documentation

## 2016-02-12 DIAGNOSIS — Z79899 Other long term (current) drug therapy: Secondary | ICD-10-CM | POA: Diagnosis not present

## 2016-02-12 DIAGNOSIS — R531 Weakness: Secondary | ICD-10-CM | POA: Diagnosis present

## 2016-02-12 DIAGNOSIS — E860 Dehydration: Secondary | ICD-10-CM | POA: Diagnosis not present

## 2016-02-12 DIAGNOSIS — I10 Essential (primary) hypertension: Secondary | ICD-10-CM | POA: Diagnosis not present

## 2016-02-12 LAB — URINE MICROSCOPIC-ADD ON

## 2016-02-12 LAB — BASIC METABOLIC PANEL
ANION GAP: 11 (ref 5–15)
BUN: 19 mg/dL (ref 6–20)
CALCIUM: 9.2 mg/dL (ref 8.9–10.3)
CO2: 22 mmol/L (ref 22–32)
CREATININE: 0.97 mg/dL (ref 0.44–1.00)
Chloride: 106 mmol/L (ref 101–111)
GFR calc Af Amer: 57 mL/min — ABNORMAL LOW (ref >60)
GFR, EST NON AFRICAN AMERICAN: 50 mL/min — AB (ref >60)
GLUCOSE: 106 mg/dL — AB (ref 65–99)
Potassium: 4.2 mmol/L (ref 3.5–5.1)
Sodium: 139 mmol/L (ref 135–145)

## 2016-02-12 LAB — CBC
HCT: 43.5 % (ref 36.0–46.0)
Hemoglobin: 14.7 g/dL (ref 12.0–15.0)
MCH: 31.7 pg (ref 26.0–34.0)
MCHC: 33.8 g/dL (ref 30.0–36.0)
MCV: 93.8 fL (ref 78.0–100.0)
PLATELETS: 240 10*3/uL (ref 150–400)
RBC: 4.64 MIL/uL (ref 3.87–5.11)
RDW: 15.3 % (ref 11.5–15.5)
WBC: 11.9 10*3/uL — ABNORMAL HIGH (ref 4.0–10.5)

## 2016-02-12 LAB — URINALYSIS, ROUTINE W REFLEX MICROSCOPIC
Glucose, UA: NEGATIVE mg/dL
Leukocytes, UA: NEGATIVE
NITRITE: NEGATIVE
Protein, ur: 30 mg/dL — AB
SPECIFIC GRAVITY, URINE: 1.025 (ref 1.005–1.030)
pH: 5.5 (ref 5.0–8.0)

## 2016-02-12 LAB — CBG MONITORING, ED: GLUCOSE-CAPILLARY: 105 mg/dL — AB (ref 65–99)

## 2016-02-12 MED ORDER — SODIUM CHLORIDE 0.9 % IV BOLUS (SEPSIS)
500.0000 mL | Freq: Once | INTRAVENOUS | Status: AC
  Administered 2016-02-12: 500 mL via INTRAVENOUS

## 2016-02-12 MED ORDER — SODIUM CHLORIDE 0.9 % IV SOLN
1000.0000 mL | Freq: Once | INTRAVENOUS | Status: AC
  Administered 2016-02-12: 1000 mL via INTRAVENOUS

## 2016-02-12 NOTE — Discharge Instructions (Signed)

## 2016-02-12 NOTE — ED Notes (Signed)
Called EMS to take Pt back to [facility].

## 2016-02-12 NOTE — ED Notes (Signed)
Patient drank approximately 16 oz water via straw while a patient here.

## 2016-02-12 NOTE — ED Provider Notes (Signed)
[facility] DEPT Provider Note   CSN: [id number] Arrival date & time: 02/12/16  1024  By signing my name below, I, Rayna Sexton, attest that this documentation has been prepared under the direction and in the presence of Ripley Fraise, MD. Electronically Signed: Rayna Sexton, ED Scribe. 02/12/16. 12:28 PM.   History   Chief Complaint Chief Complaint  Patient presents with  . Weakness    HPI HPI Comments: [place] is a 82 y.o. female who presents to the Emergency Department by ambulance from [facility] complaining of generalized weakness x 2 days. Her daughter states she believes she is dehydrated and has not been eating/drinking adequately for the past 2 days. Her daughter states this is a recurrent issue and she has lost nearly 40 lbs. Pt states she is experiencing back pain. Pt is currently taking Augmentin and her daughter believes she is taking it for a bed sore on her buttocks. Her daughter states she spends a great deal of time in bed and is not regularly mobile. Pt has a h/o lymphoma and has been in remission for 5 years. She denies n/v, fever, HA, CP, abd pain and LOC.   The history is provided by the patient and a relative. No language interpreter was used.  Weakness  This is a recurrent problem. The current episode started more than 2 days ago. The problem has been gradually worsening. There has been no fever. Pertinent negatives include no chest pain, no vomiting and no headaches.   Past Medical History:  Diagnosis Date  . Atrial fibrillation ([place])   . Cancer ([place])    nasal cancer  . Cancer ([place])    cancer in the nose and in lower jaw  . Deviated nasal septum    pt had septoplasty  . Diffuse large B cell lymphoma (HCC)    Dr. Tressie Stalker -Nasal cavity, right sided lymph adenopathy  . Dizziness   . GERD (gastroesophageal reflux disease)       . Hyperlipidemia   . Hypertension   . Itching   . Nasal turbinate hypertrophy    bilateral    . OAB (overactive bladder)   . Osteoarthritis of lumbar spine   . PONV (postoperative nausea and vomiting) 2009   after total hip  . Tubulovillous adenoma of colon 10/2008   w/ HG dysplasia    Patient Active Problem List   Diagnosis Date Noted  . Elevated LFTs 03/25/2015  . Encounter for therapeutic drug monitoring 12/22/2013  . Pacemaker 03/05/2013  . Muscle weakness (generalized) 10/19/2012  . Difficulty in walking(719.7) 10/19/2012  . Long term (current) use of anticoagulants 08/14/2012  . Dysphagia 05/11/2012  . GERD (gastroesophageal reflux disease) 05/11/2012  . Nausea alone 02/23/2011  . SOB (shortness of breath) on exertion 02/23/2011  . Lymphoma of lymph nodes of head, face, and/or neck ([place]) 01/12/2011  . Atrial fibrillation ([place]) 11/13/2008  . BRADYCARDIA 11/13/2008  . HYPERLIPIDEMIA 11/12/2008  . Essential hypertension 11/12/2008  . SYNCOPE, HX OF 11/12/2008    Past Surgical History:  Procedure Laterality Date  . BUNIONECTOMY  20+yrs ago   bilateral, [facility]  . CATARACT EXTRACTION W/ INTRAOCULAR LENS  IMPLANT, BILATERAL  2010   [facility]  . COLON SURGERY  10/29/2008   Ingram-right hemicolectomy for tubulovillous adenoma (3cm) with HG glandular dysplasia  . COLONOSCOPY  09/28/2002   RMR: Normal colonic mucosa.  Redundant and elongated but otherwise normal appearing colon/normal rectum  . COLONOSCOPY  09/11/2008   RMR: Normal rectum/Diminutive sigmoid  polyp status post snare removal/  Flat sessile semilunar lesion at and in the ileocecal valve status post hot snare piecemeal debulking as described above  . COLONOSCOPY  01/06/10   Rourk-normal rectum, normal residual colon status post right hemicolectomy. Next colonoscopy in 01/2015 health permiting  . ESOPHAGOGASTRODUODENOSCOPY Left 03/06/2013   Procedure: ESOPHAGOGASTRODUODENOSCOPY (EGD);  Surgeon: Jerene Bears, MD;  Location: [facility];  Service: Gastroenterology;  Laterality: Left;  . ESOPHAGOGASTRODUODENOSCOPY (EGD)  WITH ESOPHAGEAL DILATION  05/23/2012   [id number] ring-dilated as described above. Hiatal hernia  . HAMMER TOE SURGERY     20+ yrs ago  . NASAL SEPTOPLASTY W/ TURBINOPLASTY  12/18/2010   Procedure: NASAL SEPTOPLASTY WITH TURBINATE REDUCTION;  Surgeon: Ascencion Dike;  [facility];  Service: ENT;  Laterality: Bilateral;  . PACEMAKER INSERTION Right 02/16/2013  . PERMANENT PACEMAKER INSERTION N/A 02/16/2013   Procedure: PERMANENT PACEMAKER INSERTION;  Surgeon: Evans Lance, MD;  Location: [facility] CATH LAB;  Service: Cardiovascular;  Laterality: N/A;  . PORT-A-CATH REMOVAL Left 02/16/2013  . PORTACATH PLACEMENT  02/10/2011   Procedure: INSERTION PORT-A-CATH;  Surgeon: Scherry Ran;  [facility]: [facility];  Service: General;  Laterality: Left;  left subclavian  . skin cancer removal  6/13  . TOTAL HIP ARTHROPLASTY  2009   Right, [place] (general)    OB History    No data available       Home Medications    Prior to Admission medications   Medication Sig Start Date End Date Taking? Authorizing Provider  acetaminophen (TYLENOL) 500 MG tablet Take 500 mg by mouth every 8 (eight) hours as needed.   Yes Historical Provider, MD  amiodarone (PACERONE) 200 MG tablet Take 1 tablet (200 mg total) by mouth daily. 03/29/14  Yes Herminio Commons, MD  Calcium-Vitamin D (CALTRATE 600 PLUS-VIT D PO) Take 1 tablet by mouth daily.   Yes Historical Provider, MD  cholecalciferol (VITAMIN D) 1000 UNITS tablet Take 1,000 Units by mouth daily.   Yes Historical Provider, MD  citalopram (CELEXA) 10 MG tablet Take 10 mg by mouth at bedtime. 09/30/15  Yes Historical Provider, MD  ELIQUIS 5 MG TABS tablet Take 1 tablet by mouth daily. At 5:00 pm daily 03/17/15  Yes Historical Provider, MD  Multiple Vitamins-Minerals (CENTRUM SILVER ULTRA WOMENS) TABS Take 1 tablet by mouth daily.     Yes Historical Provider, MD  oxybutynin (DITROPAN) 5 MG tablet Take 5 mg by mouth daily.    Yes Historical Provider, MD    Family  History Family History  Problem Relation Age of Onset  . Cancer Mother   . Stroke Father   . Pseudochol deficiency Neg Hx   . Malignant hyperthermia Neg Hx   . Hypotension Neg Hx   . Anesthesia problems Neg Hx     Social History Social History  Substance Use Topics  . Smoking status: Never Smoker  . Smokeless tobacco: Never Used  . Alcohol use No     Allergies   Statins   Review of Systems Review of Systems  Constitutional: Positive for activity change and appetite change. Negative for chills and fever.  Cardiovascular: Negative for chest pain.  Gastrointestinal: Negative for abdominal pain, nausea and vomiting.  Musculoskeletal: Positive for back pain.  Skin: Positive for wound.  Neurological: Positive for weakness. Negative for syncope and headaches.  All other systems reviewed and are negative.  Physical Exam Updated Vital Signs BP 131/74 (BP Location: Left Arm)   Pulse 72   Temp  97.5 F (36.4 C) (Oral)   Resp 18   SpO2 95%   Physical Exam CONSTITUTIONAL: Elderly, frail and cachectic  HEAD: Normocephalic/atraumatic EYES: EOMI/PERRL ENMT: Mucous membranes dry NECK: supple no meningeal signs SPINE/BACK: diffuse tenderness but not step off or deformity  CV: S1/S2 noted, no murmurs/rubs/gallops noted LUNGS: Lungs are clear to auscultation bilaterally, no apparent distress ABDOMEN: soft, nontender, no rebound or guarding, bowel sounds noted throughout abdomen NEURO: Pt is awake/alert/appropriate, moves all extremitiesx4.  No facial droop.   EXTREMITIES: pulses normal/equal, full ROM, LEs contracted SKIN: warm, color normal, well healing sacral ulcer PSYCH: no abnormalities of mood noted, alert and oriented to situation  ED Treatments / Results  Labs (all labs ordered are listed, but only abnormal results are displayed) Labs Reviewed  BASIC METABOLIC PANEL - Abnormal; Notable for the following:       Result Value   Glucose, Bld 106 (*)    GFR calc non Af  Amer 50 (*)    GFR calc Af Amer 57 (*)    All other components within normal limits  CBC - Abnormal; Notable for the following:    WBC 11.9 (*)    All other components within normal limits  URINALYSIS, ROUTINE W REFLEX MICROSCOPIC (NOT AT [facility]) - Abnormal; Notable for the following:    Hgb urine dipstick TRACE (*)    Bilirubin Urine SMALL (*)    Ketones, ur TRACE (*)    Protein, ur 30 (*)    All other components within normal limits  URINE MICROSCOPIC-ADD ON - Abnormal; Notable for the following:    Squamous Epithelial / LPF 6-30 (*)    Bacteria, UA FEW (*)    All other components within normal limits  CBG MONITORING, ED - Abnormal; Notable for the following:    Glucose-Capillary 105 (*)    All other components within normal limits    EKG  EKG Interpretation  Date/Time:  Wednesday February 12 2016 10:31:35 EDT Ventricular Rate:  76 PR Interval:    QRS Duration: 96 QT Interval:  489 QTC Calculation: 550 R Axis:   83 Text Interpretation:  Sinus rhythm Borderline right axis deviation Low voltage, precordial leads Nonspecific T abnormalities, diffuse leads Prolonged QT interval Baseline wander in lead(s) V3 Confirmed by [place] ([id number]) on 02/12/2016 12:13:10 PM       Radiology Dg Chest Port 1 View  Result Date: 02/12/2016 CLINICAL DATA:  Generalized weakness, anorexia, duration of symptoms 2 days. History of nasal malignancy, atrial fibrillation, lymphoma. EXAM: PORTABLE CHEST 1 VIEW COMPARISON:  Chest x-ray of May 16, 2015 FINDINGS: Today's study is obtained in a lordotic fashion. The lungs are reasonably well inflated. There is no alveolar infiltrate or pleural effusion. The heart is mildly enlarged though stable. The pulmonary vascularity is not engorged. There is calcification in the wall of the aortic arch. The permanent pacemaker is in stable position. There is moderate degenerative change of both shoulders. IMPRESSION: Chronic bronchitic changes, stable. No  pneumonia or pulmonary edema. Aortic atherosclerosis. Electronically Signed   By: David  Martinique M.D.   On: 02/12/2016 12:51    Procedures Procedures  COORDINATION OF CARE: 12:23 PM Discussed next steps with pt. Pt verbalized understanding and is agreeable with the plan.    Medications Ordered in ED Medications  0.9 %  sodium chloride infusion (0 mLs Intravenous Stopped 02/12/16 1316)  sodium chloride 0.9 % bolus 500 mL (500 mLs Intravenous New Bag/Given 02/12/16 1314)     Initial  Impression / Assessment and Plan / ED Course  I have reviewed the triage vital signs and the nursing notes.  Pertinent labs & imaging results that were available during my care of the patient were reviewed by me and considered in my medical decision making (see chart for details).  Clinical Course    D/w kim at [facility] Pt sent due to lack of PO intake and concern for dehydration Pt given IV fluids and taking PO here Labs near baseline Patient/daughter would like to go back to [facility].  She feels comfortable there.  She does not want to be admitted.   Pt has sacral wound and is on augmentin for this   I personally performed the services described in this documentation, which was scribed in my presence. The recorded information has been reviewed and is accurate.      Final Clinical Impressions(s) / ED Diagnoses   Final diagnoses:  Dehydration    New Prescriptions New Prescriptions   No medications on file     Ripley Fraise, MD 02/12/16 1422

## 2016-02-12 NOTE — ED Triage Notes (Signed)
Patient brought in by EMS from [facility] with complaint of generalized weakness and not eating x 2 days.

## 2016-02-24 ENCOUNTER — Encounter (HOSPITAL_COMMUNITY): Payer: Self-pay

## 2016-02-24 ENCOUNTER — Inpatient Hospital Stay (HOSPITAL_COMMUNITY): Admission: AD | Admit: 2016-02-24 | Payer: Self-pay | Source: Ambulatory Visit | Admitting: Internal Medicine

## 2016-02-24 ENCOUNTER — Inpatient Hospital Stay (HOSPITAL_COMMUNITY)
Admission: AD | Admit: 2016-02-24 | Discharge: 2016-02-26 | DRG: 602 | Disposition: A | Discharge status: Hospice | Payer: Medicare Other | Source: Ambulatory Visit | Attending: Internal Medicine | Admitting: Internal Medicine

## 2016-02-24 DIAGNOSIS — L89153 Pressure ulcer of sacral region, stage 3: Secondary | ICD-10-CM | POA: Diagnosis present

## 2016-02-24 DIAGNOSIS — Z85828 Personal history of other malignant neoplasm of skin: Secondary | ICD-10-CM | POA: Diagnosis not present

## 2016-02-24 DIAGNOSIS — Z96641 Presence of right artificial hip joint: Secondary | ICD-10-CM | POA: Diagnosis present

## 2016-02-24 DIAGNOSIS — Z66 Do not resuscitate: Secondary | ICD-10-CM | POA: Diagnosis present

## 2016-02-24 DIAGNOSIS — Z7401 Bed confinement status: Secondary | ICD-10-CM | POA: Diagnosis not present

## 2016-02-24 DIAGNOSIS — K7689 Other specified diseases of liver: Secondary | ICD-10-CM | POA: Diagnosis not present

## 2016-02-24 DIAGNOSIS — R627 Adult failure to thrive: Secondary | ICD-10-CM | POA: Diagnosis present

## 2016-02-24 DIAGNOSIS — Z809 Family history of malignant neoplasm, unspecified: Secondary | ICD-10-CM

## 2016-02-24 DIAGNOSIS — E46 Unspecified protein-calorie malnutrition: Secondary | ICD-10-CM | POA: Diagnosis present

## 2016-02-24 DIAGNOSIS — K219 Gastro-esophageal reflux disease without esophagitis: Secondary | ICD-10-CM | POA: Diagnosis present

## 2016-02-24 DIAGNOSIS — L98499 Non-pressure chronic ulcer of skin of other sites with unspecified severity: Secondary | ICD-10-CM | POA: Diagnosis not present

## 2016-02-24 DIAGNOSIS — Z9049 Acquired absence of other specified parts of digestive tract: Secondary | ICD-10-CM | POA: Diagnosis not present

## 2016-02-24 DIAGNOSIS — I10 Essential (primary) hypertension: Secondary | ICD-10-CM | POA: Diagnosis present

## 2016-02-24 DIAGNOSIS — E785 Hyperlipidemia, unspecified: Secondary | ICD-10-CM | POA: Diagnosis present

## 2016-02-24 DIAGNOSIS — N3281 Overactive bladder: Secondary | ICD-10-CM | POA: Diagnosis present

## 2016-02-24 DIAGNOSIS — N390 Urinary tract infection, site not specified: Secondary | ICD-10-CM | POA: Diagnosis present

## 2016-02-24 DIAGNOSIS — Z823 Family history of stroke: Secondary | ICD-10-CM | POA: Diagnosis not present

## 2016-02-24 DIAGNOSIS — I4891 Unspecified atrial fibrillation: Secondary | ICD-10-CM | POA: Diagnosis present

## 2016-02-24 DIAGNOSIS — Z8572 Personal history of non-Hodgkin lymphomas: Secondary | ICD-10-CM

## 2016-02-24 DIAGNOSIS — L0390 Cellulitis, unspecified: Secondary | ICD-10-CM | POA: Diagnosis present

## 2016-02-24 DIAGNOSIS — R945 Abnormal results of liver function studies: Secondary | ICD-10-CM

## 2016-02-24 LAB — SEDIMENTATION RATE: Sed Rate: 83 mm/hr — ABNORMAL HIGH (ref 0–22)

## 2016-02-24 MED ORDER — ENSURE ENLIVE PO LIQD
237.0000 mL | Freq: Two times a day (BID) | ORAL | Status: DC
  Administered 2016-02-25 – 2016-02-26 (×3): 237 mL via ORAL

## 2016-02-24 MED ORDER — PIPERACILLIN-TAZOBACTAM 3.375 G IVPB
3.3750 g | Freq: Three times a day (TID) | INTRAVENOUS | Status: DC
  Administered 2016-02-24 – 2016-02-26 (×5): 3.375 g via INTRAVENOUS
  Filled 2016-02-24 (×5): qty 50

## 2016-02-24 MED ORDER — PANTOPRAZOLE SODIUM 40 MG PO TBEC
40.0000 mg | DELAYED_RELEASE_TABLET | Freq: Every day | ORAL | Status: DC
  Administered 2016-02-25 – 2016-02-26 (×2): 40 mg via ORAL
  Filled 2016-02-24 (×3): qty 1

## 2016-02-24 MED ORDER — ADULT MULTIVITAMIN W/MINERALS CH
1.0000 | ORAL_TABLET | Freq: Every day | ORAL | Status: DC
  Administered 2016-02-25 – 2016-02-26 (×2): 1 via ORAL
  Filled 2016-02-24 (×3): qty 1

## 2016-02-24 MED ORDER — ACETAMINOPHEN 650 MG RE SUPP: 650.0000 mg | Freq: Four times a day (QID) | RECTAL | Status: DC | PRN

## 2016-02-24 MED ORDER — VITAMIN D 1000 UNITS PO TABS
1000.0000 [IU] | ORAL_TABLET | Freq: Every day | ORAL | Status: DC
  Administered 2016-02-25 – 2016-02-26 (×2): 1000 [IU] via ORAL
  Filled 2016-02-24 (×3): qty 1

## 2016-02-24 MED ORDER — APIXABAN 5 MG PO TABS
5.0000 mg | ORAL_TABLET | Freq: Every day | ORAL | Status: DC
  Administered 2016-02-24: 5 mg via ORAL
  Filled 2016-02-24: qty 1

## 2016-02-24 MED ORDER — OXYBUTYNIN CHLORIDE 5 MG PO TABS
5.0000 mg | ORAL_TABLET | Freq: Every day | ORAL | Status: DC
  Administered 2016-02-25 – 2016-02-26 (×2): 5 mg via ORAL
  Filled 2016-02-24 (×3): qty 1

## 2016-02-24 MED ORDER — ENOXAPARIN SODIUM 30 MG/0.3ML ~~LOC~~ SOLN: 30.0000 mg | SUBCUTANEOUS | Status: DC

## 2016-02-24 MED ORDER — ACETAMINOPHEN 325 MG PO TABS: 650.0000 mg | ORAL_TABLET | Freq: Four times a day (QID) | ORAL | Status: DC | PRN

## 2016-02-24 MED ORDER — CITALOPRAM HYDROBROMIDE 20 MG PO TABS
10.0000 mg | ORAL_TABLET | Freq: Every day | ORAL | Status: DC
  Administered 2016-02-24 – 2016-02-25 (×2): 10 mg via ORAL
  Filled 2016-02-24 (×2): qty 1

## 2016-02-24 MED ORDER — SODIUM CHLORIDE 0.9 % IV SOLN
INTRAVENOUS | Status: AC
  Administered 2016-02-24: 23:00:00 via INTRAVENOUS

## 2016-02-24 MED ORDER — AMIODARONE HCL 200 MG PO TABS
200.0000 mg | ORAL_TABLET | Freq: Every day | ORAL | Status: DC
  Administered 2016-02-25 – 2016-02-26 (×2): 200 mg via ORAL
  Filled 2016-02-24 (×3): qty 1

## 2016-02-24 MED ORDER — VANCOMYCIN HCL IN DEXTROSE 1-5 GM/200ML-% IV SOLN
1000.0000 mg | INTRAVENOUS | Status: DC
  Administered 2016-02-24: 1000 mg via INTRAVENOUS
  Filled 2016-02-24: qty 200

## 2016-02-24 NOTE — H&P (Signed)
TRH H&P   Patient Demographics:    Sierra Good, is a 82 y.o. female  MRN: [id number]   DOB - 11-Apr-1923  Admit Date - 02/24/2016  Outpatient Primary MD for the patient is Glenda Chroman, MD  Referring MD/NP/PA:    Outpatient Specialists: Bronson Ing,   Patient coming from:  ALF  No chief complaint on file.  Skin ulcer, sacral decub, cellulitis   HPI:    Sierra Good  is a 82 y.o. female, w Pafib (chadsvasc2=4) , protein calorie malnutrition has had skin breakdown that has not improved with conservative therapy (turning patient q2h) , and then with augmentin 858m po bid x 2 weeks. Pt has yellow discharge from wound as well as erythema surround the wound.  Pt has been afebrile.  Pt sent for direct admission for increase in discharge from wound as well cellulitis.    Review of systems:    In addition to the HPI above, No Fever-chills, No Headache, No changes with Vision or hearing, No problems swallowing food or Liquids, No Chest pain, Cough or Shortness of Breath, No Abdominal pain, No Nausea or Vommitting, Bowel movements are regular, No Blood in stool or Urine, No dysuria,  No new joints pains-aches,  No new weakness, tingling, numbness in any extremity, No recent weight gain or loss, No polyuria, polydypsia or polyphagia, No significant Mental Stressors.  A full 10 point Review of Systems was done, except as stated above, all other Review of Systems were negative.   With Past History of the following :    Past Medical History:  Diagnosis Date  . Atrial fibrillation ([place]   . [place]    nasal cancer  . [place]    cancer in the nose and in lower jaw  . Deviated nasal septum    pt had septoplasty  . Diffuse large B cell lymphoma (HCC)    Dr. NTressie Stalker-Nasal cavity, right sided lymph adenopathy  . Dizziness   . GERD (gastroesophageal reflux  disease)       . Hyperlipidemia   . Hypertension   . Itching   . Nasal turbinate hypertrophy    bilateral  . OAB (overactive bladder)   . Osteoarthritis of lumbar spine   . PONV (postoperative nausea and vomiting) 2009   after total hip  . Tubulovillous adenoma of colon 10/2008   w/ HG dysplasia      Past Surgical History:  Procedure Laterality Date  . BUNIONECTOMY  20+yrs ago   bilateral, [facility]  . CATARACT EXTRACTION W/ INTRAOCULAR LENS  IMPLANT, BILATERAL  2010   [facility]  . COLON SURGERY  10/29/2008   Ingram-right hemicolectomy for tubulovillous adenoma (3cm) with HG glandular dysplasia  . COLONOSCOPY  09/28/2002   RMR: Normal colonic mucosa.  Redundant and elongated but otherwise normal appearing colon/normal rectum  . COLONOSCOPY  09/11/2008   RMR: Normal rectum/Diminutive  sigmoid polyp status post snare removal/  Flat sessile semilunar lesion at and in the ileocecal valve status post hot snare piecemeal debulking as described above  . COLONOSCOPY  01/06/10   Rourk-normal rectum, normal residual colon status post right hemicolectomy. Next colonoscopy in 01/2015 health permiting  . ESOPHAGOGASTRODUODENOSCOPY Left 03/06/2013   Procedure: ESOPHAGOGASTRODUODENOSCOPY (EGD);  Surgeon: Jerene Bears, MD;  Location: [facility];  Service: Gastroenterology;  Laterality: Left;  . ESOPHAGOGASTRODUODENOSCOPY (EGD) WITH ESOPHAGEAL DILATION  05/23/2012   [id number] ring-dilated as described above. Hiatal hernia  . HAMMER TOE SURGERY     20+ yrs ago  . NASAL SEPTOPLASTY W/ TURBINOPLASTY  12/18/2010   Procedure: NASAL SEPTOPLASTY WITH TURBINATE REDUCTION;  Surgeon: Ascencion Dike;  [facility];  Service: ENT;  Laterality: Bilateral;  . PACEMAKER INSERTION Right 02/16/2013  . PERMANENT PACEMAKER INSERTION N/A 02/16/2013   Procedure: PERMANENT PACEMAKER INSERTION;  Surgeon: Evans Lance, MD;  Location: [facility] CATH LAB;  Service: Cardiovascular;  Laterality: N/A;  . PORT-A-CATH REMOVAL Left 02/16/2013    . PORTACATH PLACEMENT  02/10/2011   Procedure: INSERTION PORT-A-CATH;  Surgeon: Scherry Ran;  [facility];  Service: General;  Laterality: Left;  left subclavian  . skin cancer removal  6/13  . TOTAL HIP ARTHROPLASTY  2009   Right, [place] (general)      Social History:     Social History  Substance Use Topics  . Smoking status: Never Smoker  . Smokeless tobacco: Never Used  . Alcohol use No     Lives - at [facility] -  bedridden   Family History :     Family History  Problem Relation Age of Onset  . Cancer Mother   . Stroke Father   . Pseudochol deficiency Neg Hx   . Malignant hyperthermia Neg Hx   . Hypotension Neg Hx   . Anesthesia problems Neg Hx       Home Medications:   Prior to Admission medications   Medication Sig Start Date End Date Taking? Authorizing Provider  dexlansoprazole (DEXILANT) 60 MG capsule Take 60 mg by mouth daily.   Yes Historical Provider, MD  acetaminophen (TYLENOL) 500 MG tablet Take 500 mg by mouth every 8 (eight) hours as needed.    Historical Provider, MD  amiodarone (PACERONE) 200 MG tablet Take 1 tablet (200 mg total) by mouth daily. 03/29/14   Herminio Commons, MD  Calcium-Vitamin D (CALTRATE 600 PLUS-VIT D PO) Take 1 tablet by mouth daily.    Historical Provider, MD  cholecalciferol (VITAMIN D) 1000 UNITS tablet Take 1,000 Units by mouth daily.    Historical Provider, MD  citalopram (CELEXA) 10 MG tablet Take 10 mg by mouth at bedtime. 09/30/15   Historical Provider, MD  ELIQUIS 5 MG TABS tablet Take 1 tablet by mouth daily. At 5:00 pm daily 03/17/15   Historical Provider, MD  Multiple Vitamins-Minerals (CENTRUM SILVER ULTRA WOMENS) TABS Take 1 tablet by mouth daily.      Historical Provider, MD  oxybutynin (DITROPAN) 5 MG tablet Take 5 mg by mouth daily.     Historical Provider, MD     Allergies:     Allergies  Allergen Reactions  . Statins Other (See Comments)    Causes leg cramps and muscle  weakness     Physical Exam:   Vitals  Blood pressure (!) 145/79, pulse 65, temperature 97.3 F (36.3 C), temperature source Oral, resp. rate 20, height _0  (1.676 m), weight  48.6 kg (107 lb 1.6 oz), SpO2 94 %.   1. General  lying in bed in NAD,    2. Normal affect and insight, Not Suicidal or Homicidal, Awake Alert, Oriented X 3.  3. No F.N deficits, ALL C.Nerves Intact, Strength 5/5 all 4 extremities, Sensation intact all 4 extremities, Plantars down going.  4. Ears and Eyes appear Normal, Conjunctivae clear, PERRLA. Moist Oral Mucosa.  5. Supple Neck, No JVD, No cervical lymphadenopathy appriciated, No Carotid Bruits.  6. Symmetrical Chest wall movement, Good air movement bilaterally, CTAB.  7. RRR, S1, S2, , No Parasternal Heave.  8. Positive Bowel Sounds, Abdomen Soft, No tenderness, No organomegaly appriciated,No rebound -guarding or rigidity.  9.  No Cyanosis, 3.75cm skin ulcer stage 2 bordering on stage3.  On sacral area.   10. Good muscle tone,  joints appear normal , no effusions, Normal ROM.  11. No Palpable Lymph Nodes in Neck or Axillae     Data Review:    CBC No results for input(s): WBC, HGB, HCT, PLT, MCV, MCH, MCHC, RDW, LYMPHSABS, MONOABS, EOSABS, BASOSABS, BANDABS in the last 168 hours.  Invalid input(s): NEUTRABS, BANDSABD ------------------------------------------------------------------------------------------------------------------  Chemistries  No results for input(s): NA, K, CL, CO2, GLUCOSE, BUN, CREATININE, CALCIUM, MG, AST, ALT, ALKPHOS, BILITOT in the last 168 hours.  Invalid input(s): GFRCGP ------------------------------------------------------------------------------------------------------------------ estimated creatinine clearance is 29 mL/min (by C-G formula based on SCr of 0.97 mg/dL). ------------------------------------------------------------------------------------------------------------------ No results for input(s): TSH,  T4TOTAL, T3FREE, THYROIDAB in the last 72 hours.  Invalid input(s): FREET3  Coagulation profile No results for input(s): INR, PROTIME in the last 168 hours. ------------------------------------------------------------------------------------------------------------------- No results for input(s): DDIMER in the last 72 hours. -------------------------------------------------------------------------------------------------------------------  Cardiac Enzymes No results for input(s): CKMB, TROPONINI, MYOGLOBIN in the last 168 hours.  Invalid input(s): CK ------------------------------------------------------------------------------------------------------------------ No results found for: BNP   ---------------------------------------------------------------------------------------------------------------  Urinalysis    Component Value Date/Time   COLORURINE YELLOW 02/12/2016 [place] 02/12/2016 1059   LABSPEC 1.025 02/12/2016 1059   PHURINE 5.5 02/12/2016 1059   GLUCOSEU NEGATIVE 02/12/2016 1059   HGBUR TRACE (A) 02/12/2016 1059   BILIRUBINUR SMALL (A) 02/12/2016 1059   KETONESUR TRACE (A) 02/12/2016 1059   PROTEINUR 30 (A) 02/12/2016 1059   UROBILINOGEN 0.2 03/05/2013 1037   NITRITE NEGATIVE 02/12/2016 1059   LEUKOCYTESUR NEGATIVE 02/12/2016 1059    ----------------------------------------------------------------------------------------------------------------   Imaging Results:    No results found.    Assessment & Plan:    Active Problems:   Cellulitis   Skin ulcer ([place])    1. Sacral decubitus ulcer Wound culture Check esr Wet to dry dressing vanco pharmacy to dose Zosyn pharmacy to dose  2. Cellulitis abx as above  3. Pafib (chas2vasc=4) Cont eliquis Check tsh Check cardiac echo Cardiology consultation   Gerd Cont protonix   DVT Prophylaxis Eliquis,  SCDs   AM Labs Ordered, also please review Full Orders  Family  Communication: Admission, patients condition and plan of care including tests being ordered have been discussed with the patient  who indicate understanding and agree with the plan and Code Status.  Code Status DNR  Likely DC to  SNF  Condition GUARDED    Consults called:    Admission status: inpatient  Time spent in minutes : 45 minutes   Jani Gravel M.D on 02/24/2016 at 9:51 PM  Between 7am to 7pm - Pager - [phone]. After 7pm go to [email or web address] - password [facility]  [facility] - Office  [phone]

## 2016-02-24 NOTE — Progress Notes (Signed)
Pharmacy Antibiotic Note  Sierra Good is a 82 y.o. female admitted on 02/24/2016 with wound infection.  Pharmacy has been consulted for Vancomycin and Zosyn dosing.  Plan: Vancomycin 1000mg   IV every 48 hours.  Goal trough 10-15 mcg/mL. Zosyn 3.375g IV q8h (4 hour infusion).  F/U V/S and clinical progress Monitor labs  Height: 5\' 6"  (167.6 cm) Weight: 107 lb 1.6 oz (48.6 kg) IBW/kg (Calculated) : 59.3  Temp (24hrs), Avg:97.3 F (36.3 C), Min:97.3 F (36.3 C), Max:97.3 F (36.3 C)  No results for input(s): WBC, CREATININE, LATICACIDVEN, VANCOTROUGH, VANCOPEAK, VANCORANDOM, GENTTROUGH, GENTPEAK, GENTRANDOM, TOBRATROUGH, TOBRAPEAK, TOBRARND, AMIKACINPEAK, AMIKACINTROU, AMIKACIN in the last 168 hours.  Estimated Creatinine Clearance: 29 mL/min (by C-G formula based on SCr of 0.97 mg/dL).    Allergies  Allergen Reactions  . Statins Other (See Comments)    Causes leg cramps and muscle weakness    Antimicrobials this admission: Vancomycin 9/18 >>  Zosyn 9/18 >>   Dose adjustments this admission: N/A  Microbiology results: No cultures  Thank you for allowing pharmacy to be a part of this patient's care. Isac Sarna, BS [place], [place] Clinical Pharmacist Pager [phone]  02/24/2016 8:59 PM

## 2016-02-25 LAB — COMPREHENSIVE METABOLIC PANEL
ALK PHOS: 110 U/L (ref 38–126)
ALT: 51 U/L (ref 14–54)
ANION GAP: 9 (ref 5–15)
AST: 54 U/L — ABNORMAL HIGH (ref 15–41)
Albumin: 2.6 g/dL — ABNORMAL LOW (ref 3.5–5.0)
BILIRUBIN TOTAL: 0.7 mg/dL (ref 0.3–1.2)
BUN: 23 mg/dL — ABNORMAL HIGH (ref 6–20)
CALCIUM: 8.9 mg/dL (ref 8.9–10.3)
CO2: 23 mmol/L (ref 22–32)
CREATININE: 0.96 mg/dL (ref 0.44–1.00)
Chloride: 106 mmol/L (ref 101–111)
GFR calc non Af Amer: 50 mL/min — ABNORMAL LOW (ref >60)
GFR, EST AFRICAN AMERICAN: 58 mL/min — AB (ref >60)
GLUCOSE: 97 mg/dL (ref 65–99)
Potassium: 3.6 mmol/L (ref 3.5–5.1)
SODIUM: 138 mmol/L (ref 135–145)
TOTAL PROTEIN: 5.9 g/dL — AB (ref 6.5–8.1)

## 2016-02-25 LAB — MRSA PCR SCREENING: MRSA BY PCR: NEGATIVE

## 2016-02-25 LAB — CBC
HCT: 44 % (ref 36.0–46.0)
HEMOGLOBIN: 14.6 g/dL (ref 12.0–15.0)
MCH: 31.3 pg (ref 26.0–34.0)
MCHC: 33.2 g/dL (ref 30.0–36.0)
MCV: 94.2 fL (ref 78.0–100.0)
PLATELETS: 279 10*3/uL (ref 150–400)
RBC: 4.67 MIL/uL (ref 3.87–5.11)
RDW: 16.2 % — ABNORMAL HIGH (ref 11.5–15.5)
WBC: 13 10*3/uL — ABNORMAL HIGH (ref 4.0–10.5)

## 2016-02-25 MED ORDER — APIXABAN 2.5 MG PO TABS
2.5000 mg | ORAL_TABLET | Freq: Two times a day (BID) | ORAL | Status: DC
  Administered 2016-02-25 – 2016-02-26 (×2): 2.5 mg via ORAL
  Filled 2016-02-25 (×2): qty 1

## 2016-02-25 MED ORDER — COLLAGENASE 250 UNIT/GM EX OINT
TOPICAL_OINTMENT | Freq: Every day | CUTANEOUS | Status: DC
  Administered 2016-02-25: 18:00:00 via TOPICAL
  Administered 2016-02-26: 1 via TOPICAL
  Filled 2016-02-25: qty 30

## 2016-02-25 MED ORDER — SODIUM CHLORIDE 0.9 % IV SOLN
INTRAVENOUS | Status: DC
  Administered 2016-02-25 (×2): via INTRAVENOUS

## 2016-02-25 NOTE — Clinical Social Work Note (Signed)
Clinical Social Work Assessment  Patient Details  Name: Sierra Good MRN: [id number] Date of Birth: [birth year]  Date of referral:  02/25/16               Reason for consult:  Discharge Planning                Permission sought to share information with:    Permission granted to share information::     Name::        Agency::     Relationship::     Contact Information:     Housing/Transportation Living arrangements for the past 2 months:  [place] of Information:  Adult Children Patient Interpreter Needed:  None Criminal Activity/Legal Involvement Pertinent to Current Situation/Hospitalization:  No - Comment as needed Significant Relationships:  Adult Children Lives with:  Facility Resident Do you feel safe going back to the place where you live?  Yes Need for family participation in patient care:  Yes (Comment)  Care giving concerns:  Some concern about return to ALF due to total care. Facility agreeable to return with hospice, however.    Social Worker assessment / plan:  CSW met with pt's daughter, Sierra Good at bedside and pt's daughter, Sierra Good joined later. Pt has been a resident at [facility] for several months. Her daughters are very involved and supportive and visit daily. Pt was doing well at facility, but is now bed bound and has unstageable ulcer to sacrum. Sierra Good shared that pt eats a few bites of her meals and was in ED a few weeks ago for fluids. She reports MD discussed feeding tube, but they are not sure how they feel about that. Daughters are concerned about pt's comfort due to wound. MD indicates hospice appropriate, but family wanted SNF. CSW discussed all options with daughters. After conversation, they really want pt to return to [facility] because they have been so pleased with care there. They also would like hospice to follow and understand that this is about comfort. Their father had hospice services at home so they are somewhat  familiar. Daughters request [facility]. Sierra Good at facility aware of conversation and agreeable to return with hospice. CM to fax referral to hospice. MD updated on plan.   Employment status:  Retired Nurse, adult PT Recommendations:  Not assessed at this time Information / Referral to community resources:  Other (Comment Required) (Hospice)  Patient/Family's Response to care:  Pt's daughters have decided for pt to return to [facility] with [facility] following.   Patient/Family's Understanding of and Emotional Response to Diagnosis, Current Treatment, and Prognosis:  Pt's daughters are aware of pt's poor prognosis and are concerned about keeping her as comfortable as possible. They shared that she has had a significant decline in the last few months. Support provided.   Emotional Assessment Appearance:  Appears stated age Attitude/Demeanor/Rapport:  Unable to Assess Affect (typically observed):  Unable to Assess Orientation:  Oriented to Self Alcohol / Substance use:  Not Applicable Psych involvement (Current and /or in the community):  No (Comment)  Discharge Needs  Concerns to be addressed:  Discharge Planning Concerns Readmission within the last 30 days:  No Current discharge risk:  Chronically ill Barriers to Discharge:  No Barriers Identified   Salome Arnt, [place] 02/25/2016, 2:41 PM [phone]

## 2016-02-25 NOTE — Consult Note (Signed)
[facility] Nurse wound consult note Reason for Consult:Unstageable pressure injury to sacrum/coccyx.  Present on admission. Daughter at bedside.  Wound type:Unstageable pressure injury Pressure Ulcer POA: Yes Measurement: 4 cm x 2.5 cm x 0.5 cm 50% adherent slough to wound bed and bone is palpable.  Wound [id number] and pale pink tissue.  Drainage (amount, consistency, odor) minimal serosanguinous.  No odor.  Periwound:Erythema to periwound Dressing procedure/placement/frequency:Cleanse sacral wound with NS and pat gently dry.  Santyl to wound bed.  Cover and fill wound depth with NS moist 2x2.  COver with ABD pad and tape. Change daily.  Will not follow at this time.  Please re-consult if needed.  Domenic Moras RN BSN [place] Pager [phone]

## 2016-02-25 NOTE — Care Management (Signed)
Patient Information   Patient Name Sierra Good, Sierra Good ([id number]) Sex Female DOB 1923/05/10  Room Bed  A302 A302-01  Patient Demographics   Address [address] [place] [address] Phone [phone] (Home) *[phone] (Mobile) E-mail Address [email or web address]  Patient Ethnicity & Race   Ethnic Group Patient Race  Not Hispanic or Latino White or Caucasian  Emergency Contact(s)   Name Relation Home Work Mobile  Sierra Good Daughter [phone]  Sierra Good,Sierra Good Daughter [phone]  [phone]  Documents on File    Status Date Received Description  Documents for the Patient  EMR Medication Summary Not Received    EMR Problem Summary Not Received    EMR Patient Summary Not Received    Driver's License Received [id number]   [place] HIPAA NOTICE OF PRIVACY - Scanned Received 11/27/10   [place] E-Signature HIPAA Notice of Privacy Received 12/15/10   [place] E-Signature HIPAA Notice of Privacy Spanish Not Received    Insurance Card Received 11/27/10   Advance Directives/Living Will/HCPOA/POA Not Received    Financial Application Not Received    Driver's License Received [id number]   Insurance Card Not Received    AMB Correspondence Not Received  08/12 Inst [facility]  HIM ROI Authorization Not Received    HIM ROI Authorization Not Received    AMB Correspondence Not Received  12/12 Card [facility]  AMB HH/NH/Hospice Not Received  [id number] Cert/POC Adv HC  AMB HH/NH/Hospice Not Received  01/13 order Adv [facility]  AMB Correspondence Not Received  01/13 order [facility]  AMB Correspondence Not Received  01/13 onc [facility]  AMB HH/NH/Hospice Not Received  02/13 Order Adv [facility]  AMB HH/NH/Hospice Not Received  02/13 Auth [facility]  AMB Correspondence Not Received  04/13 onc [facility]  AMB Correspondence Not Received  10/12 Card [facility]  AMB HH/NH/Hospice Not Received  123456 Cert/POC Adv HC   AMB HH/NH/Hospice Not  Received  123456 Cert/POC Adv HC   AMB Correspondence Not Received  06/13 onc [facility]  AMB Correspondence Not Received  07/13 CARD [facility]  AMB Correspondence Not Received  08/13 card [facility]  Release of Information Not Received    AMB Correspondence Not Received  08/13 rad onc [facility]   Insurance Card Not Received    HIM ROI Authorization Not Received    AMB Correspondence Not Received  12/13 Referral Vyas MD,D  AMB Correspondence Not Received  12/13 [facility]  HIM ROI Authorization Not Received  06/27/2012 - [facility] Notes.  HIM ROI Authorization Not Received    Release of Information Not Received    Advance Directives/Living Will/HCPOA/POA Not Received    Release of Information Not Received    Advance Directives/Living Will/HCPOA/POA Not Received    HIM ROI Authorization Not Received    [place] E-Signature HIPAA Notice of Privacy Received 10/25/12   Power of Attorney Received 10/27/12   Release of Information Not Received    Advance Directives/Living Will/HCPOA/POA Not Received    HIM ROI Authorization Not Received (Expired)  Copy all records and mail to POA. ST.   HIM ROI Authorization Not Received    Insurance Card Received 12/16/12 [employer]  Insurance Card Received 02/03/13   AMB Correspondence Not Received  06/14 Rehab [facility]  Release of Information Not Received    AMB Correspondence Not Received  10/12-08/13 card [place]  AMB New Patient Records/Historical Not Received  06/12-03/13  HIM ROI Authorization  02/28/14   HIM ROI Authorization  03/15/14  ECS representing United[employer]/ [employer]  AMB Correspondence  03/06/14 ORDER [facility] [place] Card     HIM ROI Authorization  07/18/14   HIM ROI Authorization  07/18/14   Other Photo ID Not Received    AMB Provider Completed Forms  09/21/14 ELIQUIS PRIOR AUTHORIZATION REQUEST [employer]  Insurance Card   [employer] MEDICARE  Insurance Card Received 03/25/15 AARP MEDICARE  HIM  ROI Authorization  04/01/15   HIM ROI Authorization  05/27/15   Insurance Card Received 07/04/15   HIM ROI Authorization (Expired) 10/22/15 Authorization for batch CIOX/UnitedHealthCareMedicare Risk Adjustment Review fbg  10/22/15  HIM ROI Authorization  10/28/15   AMB Correspondence (Deleted) 02/06/11 08/12 Consent [facility]  AMB Correspondence (Deleted) 04/08/11 10/12 Office Note- [place]  AMB HH/NH/Hospice (Deleted) 07/13/11   AMB HH/NH/Hospice (Deleted) 07/17/11   AMB HH/NH/Hospice (Deleted) 08/03/11   AMB Correspondence (Deleted) 08/26/11 02/13 Order [facility]  AMB Correspondence (Deleted) 08/26/11 02/13 RX [facility]  AMB Correspondence Not Received (Deleted)  08/13 onc [facility]  AMB HH/NH/Hospice Not Received (Deleted)  04/14 Order Tressie Stalker, MD   Documents for the Encounter  AOB (Assignment of Insurance Benefits) Not Received    E-signature AOB     MEDICARE RIGHTS Not Received    E-signature Medicare Rights     Admission Information   Attending Provider Admitting Provider Admission Type Admission Date/Time  Jani Gravel, MD Jani Gravel, MD Urgent 02/24/16 1954  Discharge Date Hospital Service Auth/Cert Status Service Area   Internal Medicine Incomplete [place]  Unit Room/Bed Admission Status   AP-DEPT 300 A302/A302-01 Admission (Confirmed)   Admission   Complaint  Decubitis ulcer cellulitis  Hospital Account   Name Acct ID Class Status Primary Coverage  Sierra Good, Sierra Good [id number] Inpatient [place]      Guarantor Account (for Hospital Account [id number])   Name Relation to [place]? Acct Type  Sierra Good, Sierra Good Yes Personal/Family  Address Phone    [address] [place], [place] [address] [phone]        Coverage Information (for Hospital Account [id number])   F/O Payor/Plan Precert #  [facility] Subscriber #  [place],  [place] [id number]  Address Phone  [address] [place] [address]

## 2016-02-25 NOTE — Progress Notes (Signed)
Patient ID: Sierra Good, female   DOB: 02-Sep-1922, 83 y.o.   MRN: [id number]                                                                PROGRESS NOTE                                                                                                                                                                                                             Patient Demographics:    Sierra Good, is a 82 y.o. female, DOB - 31-Jul-1922, [id number]  Admit date - 02/24/2016   Admitting Physician Jani Gravel, MD  Outpatient Primary MD for the patient is Glenda Chroman, MD  LOS - 1  Outpatient Specialists:    No chief complaint on file.    Skin ulcer, cellulitis, failure to thrive  Brief Narrative  82 y.o. female, w Pafib (chadsvasc2=4) , protein calorie malnutrition has had skin breakdown that has not improved with conservative therapy (turning patient q2h) , and then with augmentin 875mg  po bid x 2 weeks. Pt has yellow discharge from wound as well as erythema surround the wound.  Pt has been afebrile. Pt sent for direct admission for increase in discharge from wound as well cellulitis.    Subjective:    Sierra Good has been afebrile.  Pt still appears weak and generally deconditioned.  Very poor appetite.  Sierra Good her daughter and I had a discussion and she would [facility].     Assessment  & Plan :    Active Problems:   Cellulitis   Skin ulcer ([place])   1.  Skin ulcer Currently on Santyl,   I think that on discharge wet to dry dressings will be fine Awaiting culture  2. Cellulitis Cont vanco, zosyn iv  3. Protein calorie malnutrition prostat  4. Uti Cont vanco, zosyn   5. FTT  6. Pafib Check tsh Cont eliquis, cont amiodarone  Code Status : DNR  Family Communication  : w daughter  Disposition Plan  : [facility]  Barriers For Discharge :   Consults  :  Cardiology requested  Procedures  :   DVT Prophylaxis  :  Eliquis, SCDs    Lab Results  Component Value Date   PLT 279  02/25/2016    Antibiotics  :    Anti-infectives    Start     Dose/Rate Route Frequency Ordered Stop   02/24/16 2100  piperacillin-tazobactam (ZOSYN) IVPB 3.375 g     3.375 g 12.5 mL/hr over 240 Minutes Intravenous Every 8 hours 02/24/16 2058     02/24/16 2100  vancomycin (VANCOCIN) IVPB 1000 mg/200 mL premix     1,000 mg 200 mL/hr over 60 Minutes Intravenous Every 48 hours 02/24/16 2058          Objective:   Vitals:   02/24/16 2018 02/25/16 0545  BP: (!) 145/79 135/80  Pulse: 65 62  Resp: 20 20  Temp: 97.3 F (36.3 C) 97.1 F (36.2 C)  TempSrc: Oral Oral  SpO2: 94% 97%  Weight: 48.6 kg (107 lb 1.6 oz) 48.5 kg (106 lb 14.8 oz)  Height: 5\' 6"  (1.676 m)     Wt Readings from Last 3 Encounters:  02/25/16 48.5 kg (106 lb 14.8 oz)  04/10/15 68 kg (150 lb)  03/25/15 67.1 kg (148 lb)     Intake/Output Summary (Last 24 hours) at 02/25/16 1310 Last data filed at 02/25/16 1207  Gross per 24 hour  Intake              200 ml  Output                0 ml  Net              200 ml     Physical Exam  Awake Alert, Oriented X 3, No new F.N deficits, Normal affect Venus.AT,PERRAL Supple Neck,No JVD, No cervical lymphadenopathy appriciated.  Symmetrical Chest wall movement, Good air movement bilaterally, CTAB RRR,No Gallops,Rubs or new Murmurs, No Parasternal Heave +ve B.Sounds, Abd Soft, No tenderness, No organomegaly appriciated, No rebound - guarding or rigidity. No Cyanosis, Clubbing or edema, No new Rash or bruise  4cm skin ulcer, sacral decub Slight surrounding erythema, slight yellow discharge.     Data Review:    CBC  Recent Labs Lab 02/25/16 0627  WBC 13.0*  HGB 14.6  HCT 44.0  PLT 279  MCV 94.2  MCH 31.3  MCHC 33.2  RDW 16.2*    Chemistries   Recent Labs Lab 02/25/16 0627  NA 138  K 3.6  CL 106  CO2 23  GLUCOSE 97  BUN 23*  CREATININE 0.96  CALCIUM 8.9  AST 54*  ALT 51  ALKPHOS 110  BILITOT  0.7   ------------------------------------------------------------------------------------------------------------------ No results for input(s): CHOL, HDL, LDLCALC, TRIG, CHOLHDL, LDLDIRECT in the last 72 hours.  No results found for: HGBA1C ------------------------------------------------------------------------------------------------------------------ No results for input(s): TSH, T4TOTAL, T3FREE, THYROIDAB in the last 72 hours.  Invalid input(s): FREET3 ------------------------------------------------------------------------------------------------------------------ No results for input(s): VITAMINB12, FOLATE, FERRITIN, TIBC, IRON, RETICCTPCT in the last 72 hours.  Coagulation profile No results for input(s): INR, PROTIME in the last 168 hours.  No results for input(s): DDIMER in the last 72 hours.  Cardiac Enzymes No results for input(s): CKMB, TROPONINI, MYOGLOBIN in the last 168 hours.  Invalid input(s): CK ------------------------------------------------------------------------------------------------------------------ No results found for: BNP  Inpatient Medications  Scheduled Meds: . amiodarone  200 mg Oral Daily  . apixaban  2.5 mg Oral BID  . cholecalciferol  1,000 Units Oral Daily  . citalopram  10 mg Oral QHS  . collagenase   Topical Daily  . feeding supplement (ENSURE ENLIVE)  237 mL Oral BID BM  . multivitamin with minerals  1 tablet Oral  Daily  . oxybutynin  5 mg Oral Daily  . pantoprazole  40 mg Oral Daily  . piperacillin-tazobactam (ZOSYN)  IV  3.375 g Intravenous Q8H  . vancomycin  1,000 mg Intravenous Q48H   Continuous Infusions:  PRN Meds:.acetaminophen **OR** acetaminophen  Micro Results Recent Results (from the past 240 hour(s))  MRSA PCR Screening     Status: None   Collection Time: 02/25/16  2:43 AM  Result Value Ref Range Status   MRSA by PCR NEGATIVE NEGATIVE Final    Comment:        The GeneXpert MRSA Assay (FDA approved for NASAL  specimens only), is one component of a comprehensive MRSA colonization surveillance program. It is not intended to diagnose MRSA infection nor to guide or monitor treatment for MRSA infections.     Radiology Reports Dg Lumbar Spine Complete  Result Date: 02/03/2016 CLINICAL DATA:  Bilateral low back pain. EXAM: LUMBAR SPINE - COMPLETE 4+ VIEW COMPARISON:  05/16/2015 FINDINGS: Degenerative disc disease and facet disease throughout the lumbar spine. No fracture or subluxation. SI joints are symmetric and unremarkable. Diffuse aortic and iliac calcifications. No aneurysm. IMPRESSION: Degenerative changes.  No acute findings. Electronically Signed   By: Rolm Baptise M.D.   On: 02/03/2016 14:59   Dg Chest Port 1 View  Result Date: 02/12/2016 CLINICAL DATA:  Generalized weakness, anorexia, duration of symptoms 2 days. History of nasal malignancy, atrial fibrillation, lymphoma. EXAM: PORTABLE CHEST 1 VIEW COMPARISON:  Chest x-ray of May 16, 2015 FINDINGS: Good's study is obtained in a lordotic fashion. The lungs are reasonably well inflated. There is no alveolar infiltrate or pleural effusion. The heart is mildly enlarged though stable. The pulmonary vascularity is not engorged. There is calcification in the wall of the aortic arch. The permanent pacemaker is in stable position. There is moderate degenerative change of both shoulders. IMPRESSION: Chronic bronchitic changes, stable. No pneumonia or pulmonary edema. Aortic atherosclerosis. Electronically Signed   By: David  Martinique M.D.   On: 02/12/2016 12:51   Dg Hip Unilat With Pelvis 2-3 Views Left  Result Date: 02/03/2016 CLINICAL DATA:  Bilateral lower back pain with sciatica. History of right hip arthroplasty. EXAM: DG HIP (WITH OR WITHOUT PELVIS) 2-3V LEFT COMPARISON:  None. FINDINGS: No fracture. No bone lesion. Hip joint is normally spaced and aligned. No significant arthropathic change. Bones are demineralized. Femoral vessel arterial  vascular calcifications. Soft tissues are otherwise unremarkable. IMPRESSION: No fracture, bone lesion or significant hip joint abnormality. Electronically Signed   By: Lajean Manes M.D.   On: 02/03/2016 15:40   Dg Hip Unilat With Pelvis 2-3 Views Right  Result Date: 02/03/2016 CLINICAL DATA:  Bilateral low back pain with sciatica. History of right hip arthroplasty in 2009. EXAM: DG HIP (WITH OR WITHOUT PELVIS) 2-3V RIGHT COMPARISON:  03/14/2015 FINDINGS: No fracture. The right hip total arthroplasty appears well aligned with no evidence of prosthetic component loosening. Left hip joint, SI joints and symphysis pubis are normally spaced and aligned. Bones are diffusely demineralized. Femoral vascular calcifications are noted medially. Soft tissues are otherwise unremarkable. IMPRESSION: No fracture or dislocation. No evidence of loosening of the right hip total arthroplasty. Electronically Signed   By: Lajean Manes M.D.   On: 02/03/2016 15:42    Time Spent in minutes  30   Jani Gravel M.D on 02/25/2016 at 1:10 PM  Between 7am to 7am - Pager - [phone]

## 2016-02-25 NOTE — Care Management Note (Signed)
Case Management Note  Patient Details  Name: Sierra Good MRN: [id number] Date of Birth: Jul 07, 1922  Subjective/Objective:                  Pt admitted from [facility]. Pt plans to return to ALF with hospice. Family has requested [facility]. Referral has been faxed to Hospice and CM spoke with [place], hospice rep. Anticipate DC home tomorrow with hospice services of [facility]. Pt was active with [facility] prior to admission. Romualdo Bolk, of [facility], aware of admission and DC plan.   Action/Plan: Will cont to follow.   Expected Discharge Date:    02/26/2016              Expected Discharge Plan:  Assisted Living / Rest Home (with hospice )  In-House Referral:  Clinical Social Work  Discharge planning Services  CM Consult  Post Acute Care Choice:  Hospice Choice offered to:  Adult Children  DME Arranged:    DME Agency:     HH Arranged:  RN [place] Agency:  [facility]  Status of Service:  In process, will continue to follow  If discussed at Long Length of Stay Meetings, dates discussed:    Additional Comments:  Sherald Barge, RN 02/25/2016, 2:42 PM

## 2016-02-25 NOTE — Progress Notes (Signed)
Consult request received. Indication for consult per consult order:   "Pt is bedridden, Dr. Bronson Ing is her cardiologist, family asking if cardiology can come since her annual visit is soon."  Chart reviewed, patient admitted with cellulitis. She has no acute active cardiac conditions at this time. No admission EKG, however EKG from 02/13/16 shows she is maintaining NSR, heart rates within normal limits this admit, she is on appopriate therapy for her afib. She has appropriate annual follow with both her primary cardiologists Dr Lovena Le and Dr Bronson Ing coming up. No indication for inpatient cardiology evaluation at this time, we will discontinue consultation. Please call us back if acute cardiac conditions develop this admission.    Carlyle Dolly MD

## 2016-02-26 ENCOUNTER — Inpatient Hospital Stay (HOSPITAL_COMMUNITY): Payer: Medicare Other

## 2016-02-26 LAB — COMPREHENSIVE METABOLIC PANEL
ALK PHOS: 102 U/L (ref 38–126)
ALT: 44 U/L (ref 14–54)
ANION GAP: 6 (ref 5–15)
AST: 47 U/L — AB (ref 15–41)
Albumin: 2.2 g/dL — ABNORMAL LOW (ref 3.5–5.0)
BILIRUBIN TOTAL: 0.7 mg/dL (ref 0.3–1.2)
BUN: 19 mg/dL (ref 6–20)
CALCIUM: 8.1 mg/dL — AB (ref 8.9–10.3)
CO2: 22 mmol/L (ref 22–32)
Chloride: 113 mmol/L — ABNORMAL HIGH (ref 101–111)
Creatinine, Ser: 0.93 mg/dL (ref 0.44–1.00)
GFR calc Af Amer: >60 mL/min (ref >60)
GFR, EST NON AFRICAN AMERICAN: 52 mL/min — AB (ref >60)
Glucose, Bld: 95 mg/dL (ref 65–99)
POTASSIUM: 3.2 mmol/L — AB (ref 3.5–5.1)
Sodium: 141 mmol/L (ref 135–145)
TOTAL PROTEIN: 5.2 g/dL — AB (ref 6.5–8.1)

## 2016-02-26 LAB — CBC
HEMATOCRIT: 39 % (ref 36.0–46.0)
Hemoglobin: 12.7 g/dL (ref 12.0–15.0)
MCH: 31.1 pg (ref 26.0–34.0)
MCHC: 32.6 g/dL (ref 30.0–36.0)
MCV: 95.6 fL (ref 78.0–100.0)
Platelets: 227 10*3/uL (ref 150–400)
RBC: 4.08 MIL/uL (ref 3.87–5.11)
RDW: 16.3 % — AB (ref 11.5–15.5)
WBC: 10.6 10*3/uL — ABNORMAL HIGH (ref 4.0–10.5)

## 2016-02-26 LAB — TSH: TSH: 3.202 u[IU]/mL (ref 0.350–4.500)

## 2016-02-26 MED ORDER — PRO-STAT SUGAR FREE PO LIQD: 30.0000 mL | Freq: Two times a day (BID) | ORAL | Status: DC

## 2016-02-26 MED ORDER — APIXABAN 2.5 MG PO TABS: 2.5000 mg | ORAL_TABLET | Freq: Two times a day (BID) | ORAL | 0 refills | Status: AC

## 2016-02-26 MED ORDER — POTASSIUM CHLORIDE 10 MEQ/100ML IV SOLN
10.0000 meq | INTRAVENOUS | Status: AC
  Administered 2016-02-26 (×2): 10 meq via INTRAVENOUS
  Filled 2016-02-26 (×3): qty 100

## 2016-02-26 MED ORDER — DIPHENHYDRAMINE HCL 25 MG PO CAPS: 25.0000 mg | ORAL_CAPSULE | Freq: Four times a day (QID) | ORAL | Status: DC | PRN

## 2016-02-26 MED ORDER — ENSURE ENLIVE PO LIQD: 237.0000 mL | Freq: Two times a day (BID) | ORAL | 12 refills | Status: AC

## 2016-02-26 MED ORDER — DIPHENHYDRAMINE HCL 25 MG PO CAPS: 25.0000 mg | ORAL_CAPSULE | Freq: Four times a day (QID) | ORAL | 0 refills | Status: AC | PRN

## 2016-02-26 MED ORDER — NYSTATIN 100000 UNIT/GM EX POWD: Freq: Four times a day (QID) | CUTANEOUS | Status: DC

## 2016-02-26 MED ORDER — NYSTATIN 100000 UNIT/GM EX POWD: Freq: Four times a day (QID) | CUTANEOUS | 0 refills | Status: AC

## 2016-02-26 NOTE — Care Management Note (Signed)
Case Management Note  Patient Details  Name: Sierra Good MRN: [id number] Date of Birth: August 19, 1922  Expected Discharge Date:    02/26/2016              Expected Discharge Plan:  Assisted Living / [place] (with hospice )  In-House Referral:  Clinical Social Work  Discharge planning Services  CM Consult  Post Acute Care Choice:  Hospice Choice offered to:  Adult Children  DME Arranged:    DME Agency:     HH Arranged:  RN [place] Agency:  [facility]  Status of Service:  Completed, signed off  If discussed at [employer] of Stay Meetings, dates discussed:    Additional Comments: Pt discharging back to ALF today with hospice services through [facility]. Hospice is aware of DC today. DC summary will be faxed once available. CSW will make arrangements for return to facility this afternoon.   Sherald Barge, RN 02/26/2016, 2:06 PM

## 2016-02-26 NOTE — [place] FL2 (Signed)
[place] MEDICAID FL2 LEVEL OF CARE SCREENING TOOL     IDENTIFICATION  Patient Name: Sierra Good Birthdate: February 19, 1923 Sex: female Admission Date (Current Location): 02/24/2016  [facility] and [place] Number:  [employer] and Address:  [facility] [address], [place]      Provider Number: [id number]  Attending Physician Name and Address:  Jani Gravel, MD  Relative Name and Phone Number:       Current Level of Care: Hospital Recommended Level of Care: [place] Prior Approval Number:    Date Approved/Denied:   PASRR Number:    Discharge Plan: Other (Comment) (ALF)    Current Diagnoses: Patient Active Problem List   Diagnosis Date Noted  . Cellulitis 02/24/2016  . Skin ulcer ([place]) 02/24/2016  . Elevated LFTs 03/25/2015  . Encounter for therapeutic drug monitoring 12/22/2013  . Pacemaker 03/05/2013  . Muscle weakness (generalized) 10/19/2012  . Difficulty in walking(719.7) 10/19/2012  . Long term (current) use of anticoagulants 08/14/2012  . Dysphagia 05/11/2012  . GERD (gastroesophageal reflux disease) 05/11/2012  . Nausea alone 02/23/2011  . SOB (shortness of breath) on exertion 02/23/2011  . Lymphoma of lymph nodes of head, face, and/or neck ([place]) 01/12/2011  . Atrial fibrillation ([place]) 11/13/2008  . BRADYCARDIA 11/13/2008  . HYPERLIPIDEMIA 11/12/2008  . Essential hypertension 11/12/2008  . SYNCOPE, HX OF 11/12/2008    Orientation RESPIRATION BLADDER Height & Weight     Self  Normal Incontinent Weight: 106 lb 14.8 oz (48.5 kg) Height:  5\' 6"  (167.6 cm)  BEHAVIORAL SYMPTOMS/MOOD NEUROLOGICAL BOWEL NUTRITION STATUS  Other (Comment) (none)  (n/a) Incontinent Diet (Heart healthy)  AMBULATORY STATUS COMMUNICATION OF NEEDS Skin   Total Care Verbally Other (Comment) (Unstageable to sacrum/coccyx. Cleanse sacral wound with NS and pat gently dry.  Santyl to wound bed.  Cover and fill wound depth with NS moist  2x2.  COver with ABD pad and tape. Change daily. )                       Personal Care Assistance Level of Assistance  Bathing, Feeding, Dressing, Total care  Total care         Functional Limitations Info  Sight, Hearing, Speech Sight Info: Adequate Hearing Info: Adequate Speech Info: Adequate    SPECIAL CARE FACTORS FREQUENCY                       Contractures      Additional Factors Info  Code Status, Allergies Code Status Info: DNR Allergies Info: Statins           Current Medications (02/26/2016):  This is the current hospital active medication list Current Facility-Administered Medications  Medication Dose Route Frequency Provider Last Rate Last Dose  . 0.9 %  sodium chloride infusion   Intravenous Continuous Jani Gravel, MD 75 mL/hr at 02/25/16 2227    . acetaminophen (TYLENOL) tablet 650 mg  650 mg Oral Q6H PRN Jani Gravel, MD       Or  . acetaminophen (TYLENOL) suppository 650 mg  650 mg Rectal Q6H PRN Jani Gravel, MD      . amiodarone (PACERONE) tablet 200 mg  200 mg Oral Daily Jani Gravel, MD   200 mg at 02/26/16 [id number]  . apixaban (ELIQUIS) tablet 2.5 mg  2.5 mg Oral BID Jani Gravel, MD   2.5 mg at 02/26/16 [id number]  . cholecalciferol (VITAMIN D) tablet 1,000 Units  1,000 Units Oral Daily Jani Gravel, MD   1,000 Units at 02/26/16 [phone]  . citalopram (CELEXA) tablet 10 mg  10 mg Oral QHS Jani Gravel, MD   10 mg at 02/25/16 2227  . diphenhydrAMINE (BENADRYL) capsule 25 mg  25 mg Oral Q6H PRN Jani Gravel, MD      . feeding supplement (ENSURE ENLIVE) (ENSURE ENLIVE) liquid 237 mL  237 mL Oral BID BM Jani Gravel, MD   237 mL at 02/26/16 1000  . feeding supplement (PRO-STAT SUGAR FREE 64) liquid 30 mL  30 mL Oral BID Jani Gravel, MD      . multivitamin with minerals tablet 1 tablet  1 tablet Oral Daily Jani Gravel, MD   1 tablet at 02/26/16 [id number]  . nystatin (MYCOSTATIN/NYSTOP) topical powder   Topical QID Jani Gravel, MD      . oxybutynin [facility]) tablet 5 mg  5 mg Oral Daily Jani Gravel, MD   5 mg at 02/26/16 [id number]  . pantoprazole (PROTONIX) EC tablet 40 mg  40 mg Oral Daily Jani Gravel, MD   40 mg at 02/26/16 [id number]  . piperacillin-tazobactam (ZOSYN) IVPB 3.375 g  3.375 g Intravenous Q8H Jani Gravel, MD   3.375 g at 02/26/16 0636  . vancomycin (VANCOCIN) IVPB 1000 mg/200 mL premix  1,000 mg Intravenous Q48H Jani Gravel, MD   1,000 mg at 02/24/16 2252   Facility-Administered Medications Ordered in Other Encounters  Medication Dose Route Frequency Provider Last Rate Last Dose  . heparin lock flush 100 unit/mL  500 Units Intravenous Once [employer], PA-C      . sodium chloride 0.9 % injection 10 mL  10 mL Intravenous PRN [facility] [employer], PA-C         Discharge Medications: Discharge Medications       Medication List    STOP taking these medications   acetaminophen 500 MG tablet Commonly known as:  TYLENOL  collagenase ointment Commonly known as:  SANTYL    TAKE these medications   amiodarone 200 MG tablet Commonly known as:  PACERONE Take 1 tablet (200 mg total) by mouth daily.  amoxicillin 875 MG tablet Commonly known as:  AMOXIL Take 875 mg by mouth 2 (two) times daily.  apixaban 2.5 MG Tabs tablet Commonly known as:  ELIQUIS Take 1 tablet (2.5 mg total) by mouth 2 (two) times daily. What changed:  medication strength  how much to take  when to take this  CALTRATE 600 PLUS-VIT D PO Take 1 tablet by mouth daily.  CENTRUM SILVER ULTRA WOMENS Tabs Take 1 tablet by mouth daily.  cholecalciferol 1000 units tablet Commonly known as:  VITAMIN D Take 1,000 Units by mouth daily.  citalopram 20 MG tablet Commonly known as:  CELEXA Take 20 mg by mouth at bedtime.  diphenhydrAMINE 25 mg capsule Commonly known as:  BENADRYL Take 1 capsule (25 mg total) by mouth every 6 (six) hours as needed for itching.  feeding supplement (ENSURE ENLIVE) Liqd Take 237 mLs by mouth 2 (two) times daily between meals.  feeding supplement (PRO-STAT SUGAR FREE 64)  Liqd Take 30 mLs by mouth 2 (two) times daily.  nystatin powder Commonly known as:  MYCOSTATIN/NYSTOP Apply topically 4 (four) times daily.  oxybutynin 5 MG 24 hr tablet Commonly known as:  DITROPAN-XL Take 5 mg by mouth daily.  pantoprazole 40 MG tablet Commonly known as:  PROTONIX Take 40 mg by mouth daily.       Relevant Imaging Results:  Relevant Lab Results:   Additional Information [facility], [place], [place]

## 2016-02-26 NOTE — Care Management Important Message (Signed)
Important Message  Patient Details  Name: Sierra Good MRN: [id number] Date of Birth: [birth year]   Medicare Important Message Given:  Yes    Sherald Barge, RN 02/26/2016, 2:05 PM

## 2016-02-26 NOTE — Discharge Summary (Signed)
Sierra Good, is a 82 y.o. female  DOB 1923-02-10  MRN [id number].  Admission date:  02/24/2016  Admitting Physician  Jani Gravel, MD  Discharge Date:  02/26/2016   Primary MD  Glenda Chroman, MD  Recommendations for primary care physician for things to follow:   Skin ulcer Wet to dry dressing change qday augmentin 875mg  po bid x 10 days Check cbc in 2 weeks  Uti Cont augmentin as above  Protein calorie malnutrition Cont prostat, ensure  Or boost as well  FTT Hospice  Pafib:  Cont eliquis at lower dose,   Abnormal lft Check cmp in 2 weeks.    Admission Diagnosis  Decubitis ulcer cellulitis   Discharge Diagnosis  Decubitis ulcer cellulitis    Principal Problem:   Skin ulcer ([place]) Active Problems:   Cellulitis      Past Medical History:  Diagnosis Date  . Atrial fibrillation ([place])   . Cancer ([place])    nasal cancer  . Cancer ([place])    cancer in the nose and in lower jaw  . Deviated nasal septum    pt had septoplasty  . Diffuse large B cell lymphoma ([facility])    Dr. Tressie Stalker -Nasal cavity, right sided lymph adenopathy  . Dizziness   . GERD (gastroesophageal reflux disease)       . Hyperlipidemia   . Hypertension   . Itching   . Nasal turbinate hypertrophy    bilateral  . OAB (overactive bladder)   . Osteoarthritis of lumbar spine   . PONV (postoperative nausea and vomiting) 2009   after total hip  . Tubulovillous adenoma of colon 10/2008   w/ HG dysplasia    Past Surgical History:  Procedure Laterality Date  . BUNIONECTOMY  20+yrs ago   bilateral, [facility]  . CATARACT EXTRACTION W/ INTRAOCULAR LENS  IMPLANT, BILATERAL  2010   [facility]  . COLON SURGERY  10/29/2008   [facility]-right hemicolectomy for tubulovillous adenoma (3cm) with HG glandular dysplasia  . COLONOSCOPY  09/28/2002   RMR: Normal colonic mucosa.  Redundant and elongated but otherwise normal appearing colon/normal  rectum  . COLONOSCOPY  09/11/2008   RMR: Normal rectum/Diminutive sigmoid polyp status post snare removal/  Flat sessile semilunar lesion at and in the ileocecal valve status post hot snare piecemeal debulking as described above  . COLONOSCOPY  01/06/10   Rourk-normal rectum, normal residual colon status post right hemicolectomy. Next colonoscopy in 01/2015 health permiting  . ESOPHAGOGASTRODUODENOSCOPY Left 03/06/2013   Procedure: ESOPHAGOGASTRODUODENOSCOPY (EGD);  Surgeon: Jerene Bears, MD;  Location: [facility];  Service: Gastroenterology;  Laterality: Left;  . ESOPHAGOGASTRODUODENOSCOPY (EGD) WITH ESOPHAGEAL DILATION  05/23/2012   [id number] ring-dilated as described above. Hiatal hernia  . HAMMER TOE SURGERY     20+ yrs ago  . NASAL SEPTOPLASTY W/ TURBINOPLASTY  12/18/2010   Procedure: NASAL SEPTOPLASTY WITH TURBINATE REDUCTION;  Surgeon: Ascencion Dike;  Location: [facility];  Service: ENT;  Laterality: Bilateral;  . PACEMAKER INSERTION Right 02/16/2013  .  PERMANENT PACEMAKER INSERTION N/A 02/16/2013   Procedure: PERMANENT PACEMAKER INSERTION;  Surgeon: Evans Lance, MD;  Location: [facility] CATH LAB;  Service: Cardiovascular;  Laterality: N/A;  . PORT-A-CATH REMOVAL Left 02/16/2013  . PORTACATH PLACEMENT  02/10/2011   Procedure: INSERTION PORT-A-CATH;  Surgeon: Scherry Ran;  Location: [facility];  Service: General;  Laterality: Left;  left subclavian  . skin cancer removal  6/13  . TOTAL HIP ARTHROPLASTY  2009   Right, [facility] (general)       HPI  from the history and physical done on the day of admission:      83 y.o.female,w Pafib (chadsvasc2=4) , protein calorie malnutrition has had skin breakdown that has not improved with conservative therapy (turning patient q2h) , and then with augmentin 875mg  po bid x 2 weeks. Pt has yellow discharge from wound as well as erythema surround the wound. Pt has been afebrile. Pt sent for direct admission for increase in discharge from wound as well  cellulitis.    Hospital Course:        Pt admitted for skin ulcer and cellulitis, and found to have uti.  Wound culture was ordered but apparently nursing staff did not obtain.  Pt has been afebrile,  Still with slight yellow discharge from the skin ulcer.  Pt has been seen by wound RN who has ordered santyl.  Pt was prev on calclium alginate prior to admission and then wet to dry dressing.  Pt developed slight rash on back ? vanco vs heat/ yeast rash.  Options were given to daughter,  Social work/ case manager state that pt and daughter want home/ ALF with hospice.      Follow UP  Follow-up Information    Jani Gravel, MD Follow up in 3 day(s).   Specialty:  Internal Medicine Contact information: [address] [place] [address] [phone]            Consults obtained -   Discharge Condition: stable  Diet and Activity recommendation: See Discharge Instructions below  Discharge Instructions    Wet to dry dressing to sacral decub change daily     Discharge Medications       Medication List    STOP taking these medications   acetaminophen 500 MG tablet Commonly known as:  TYLENOL   collagenase ointment Commonly known as:  SANTYL     TAKE these medications   amiodarone 200 MG tablet Commonly known as:  PACERONE Take 1 tablet (200 mg total) by mouth daily.   amoxicillin 875 MG tablet Commonly known as:  AMOXIL Take 875 mg by mouth 2 (two) times daily.   apixaban 2.5 MG Tabs tablet Commonly known as:  ELIQUIS Take 1 tablet (2.5 mg total) by mouth 2 (two) times daily. What changed:  medication strength  how much to take  when to take this   CALTRATE 600 PLUS-VIT D PO Take 1 tablet by mouth daily.   CENTRUM SILVER ULTRA WOMENS Tabs Take 1 tablet by mouth daily.   cholecalciferol 1000 units tablet Commonly known as:  VITAMIN D Take 1,000 Units by mouth daily.   citalopram 20 MG tablet Commonly known as:  CELEXA Take 20 mg by mouth at  bedtime.   diphenhydrAMINE 25 mg capsule Commonly known as:  BENADRYL Take 1 capsule (25 mg total) by mouth every 6 (six) hours as needed for itching.   feeding supplement (ENSURE ENLIVE) Liqd Take 237 mLs by mouth 2 (two) times daily between meals.  feeding supplement (PRO-STAT SUGAR FREE 64) Liqd Take 30 mLs by mouth 2 (two) times daily.   nystatin powder Commonly known as:  MYCOSTATIN/NYSTOP Apply topically 4 (four) times daily.   oxybutynin 5 MG 24 hr tablet Commonly known as:  DITROPAN-XL Take 5 mg by mouth daily.   pantoprazole 40 MG tablet Commonly known as:  PROTONIX Take 40 mg by mouth daily.       Major procedures and Radiology Reports - PLEASE review detailed and final reports for all details, in brief -      Dg Lumbar Spine Complete  Result Date: 02/03/2016 CLINICAL DATA:  Bilateral low back pain. EXAM: LUMBAR SPINE - COMPLETE 4+ VIEW COMPARISON:  05/16/2015 FINDINGS: Degenerative disc disease and facet disease throughout the lumbar spine. No fracture or subluxation. SI joints are symmetric and unremarkable. Diffuse aortic and iliac calcifications. No aneurysm. IMPRESSION: Degenerative changes.  No acute findings. Electronically Signed   By: Rolm Baptise M.D.   On: 02/03/2016 14:59   US Abdomen Limited  Result Date: 02/26/2016 CLINICAL DATA:  Abnormal liver function. EXAM: US ABDOMEN LIMITED - RIGHT UPPER QUADRANT COMPARISON:  Ultrasound 04/02/2015 . FINDINGS: Gallbladder: Gallstone 5 mm gallstone noted. Gallbladder sludge noted. Gallbladder wall thickness normal at 1.6 mm. 7 mm non shadowing non mobile echogenic focus in the anterior gallbladder wall. This could represent a focal area tumefactive non mobile sludge or gallbladder polyp. Common bile duct: Diameter: 3.9 mm Liver: 1.4 x 1.4 x 1.3 cm thinly septated most likely benign cyst left hepatic lobe. IMPRESSION: 1. Gallstones and gallbladder sludge. No evidence of cholecystitis or biliary distention. 2.   Probable 7 mm polyp anterior gallbladder wall. 3 1.4 cm thinly septated cyst left hepatic lobe. This is most likely benign. Electronically Signed   By: Marcello Moores  Register   On: 02/26/2016 10:43   Dg Chest Port 1 View  Result Date: 02/12/2016 CLINICAL DATA:  Generalized weakness, anorexia, duration of symptoms 2 days. History of nasal malignancy, atrial fibrillation, lymphoma. EXAM: PORTABLE CHEST 1 VIEW COMPARISON:  Chest x-ray of May 16, 2015 FINDINGS: Today's study is obtained in a lordotic fashion. The lungs are reasonably well inflated. There is no alveolar infiltrate or pleural effusion. The heart is mildly enlarged though stable. The pulmonary vascularity is not engorged. There is calcification in the wall of the aortic arch. The permanent pacemaker is in stable position. There is moderate degenerative change of both shoulders. IMPRESSION: Chronic bronchitic changes, stable. No pneumonia or pulmonary edema. Aortic atherosclerosis. Electronically Signed   By: David  Martinique M.D.   On: 02/12/2016 12:51   Dg Hip Unilat With Pelvis 2-3 Views Left  Result Date: 02/03/2016 CLINICAL DATA:  Bilateral lower back pain with sciatica. History of right hip arthroplasty. EXAM: DG HIP (WITH OR WITHOUT PELVIS) 2-3V LEFT COMPARISON:  None. FINDINGS: No fracture. No bone lesion. Hip joint is normally spaced and aligned. No significant arthropathic change. Bones are demineralized. Femoral vessel arterial vascular calcifications. Soft tissues are otherwise unremarkable. IMPRESSION: No fracture, bone lesion or significant hip joint abnormality. Electronically Signed   By: Lajean Manes M.D.   On: 02/03/2016 15:40   Dg Hip Unilat With Pelvis 2-3 Views Right  Result Date: 02/03/2016 CLINICAL DATA:  Bilateral low back pain with sciatica. History of right hip arthroplasty in 2009. EXAM: DG HIP (WITH OR WITHOUT PELVIS) 2-3V RIGHT COMPARISON:  03/14/2015 FINDINGS: No fracture. The right hip total arthroplasty appears well  aligned with no evidence of prosthetic component loosening. Left hip joint,  SI joints and symphysis pubis are normally spaced and aligned. Bones are diffusely demineralized. Femoral vascular calcifications are noted medially. Soft tissues are otherwise unremarkable. IMPRESSION: No fracture or dislocation. No evidence of loosening of the right hip total arthroplasty. Electronically Signed   By: Lajean Manes M.D.   On: 02/03/2016 15:42    Micro Results     Recent Results (from the past 240 hour(s))  MRSA PCR Screening     Status: None   Collection Time: 02/25/16  2:43 AM  Result Value Ref Range Status   MRSA by PCR NEGATIVE NEGATIVE Final    Comment:        The GeneXpert MRSA Assay (FDA approved for NASAL specimens only), is one component of a comprehensive MRSA colonization surveillance program. It is not intended to diagnose MRSA infection nor to guide or monitor treatment for MRSA infections.        Today   Subjective    [place] today has been afebile,  Still with poor po intake and FTT but this is not new, .   no headache,no chest abdominal pain,no new weakness tingling or numbness, feels better wants to go home today to ALF   Objective   Blood pressure 139/65, pulse 60, temperature 97.9 F (36.6 C), temperature source Axillary, resp. rate 18, height 5\' 6"  (1.676 m), weight 48.5 kg (106 lb 14.8 oz), SpO2 98 %.   Intake/Output Summary (Last 24 hours) at 02/26/16 1451 Last data filed at 02/25/16 1536  Gross per 24 hour  Intake            507.5 ml  Output                0 ml  Net            507.5 ml    Exam Awake Alert, Oriented x 3, No new F.N deficits, Normal affect [place].AT,PERRAL Supple Neck,No JVD, No cervical lymphadenopathy appriciated.  Symmetrical Chest wall movement, Good air movement bilaterally, CTAB RRR,No Gallops,Rubs or new Murmurs, No Parasternal Heave +ve B.Sounds, Abd Soft, Non tender, No organomegaly appriciated, No rebound -guarding or  rigidity. No Cyanosis, Clubbing or edema,  4cm sacral decub,  Yellow drainage mild  Data Review   CBC w Diff: Lab Results  Component Value Date   WBC 10.6 (H) 02/26/2016   HGB 12.7 02/26/2016   HCT 39.0 02/26/2016   HCT 47 03/21/2012   PLT 227 02/26/2016   LYMPHOPCT 13 07/18/2015   BANDSPCT 50 (H) 02/26/2011   MONOPCT 7 07/18/2015   EOSPCT 3 07/18/2015   BASOPCT 0 07/18/2015    CMP: Lab Results  Component Value Date   NA 141 02/26/2016   NA 141 03/21/2012   K 3.2 (L) 02/26/2016   K 4.0 03/21/2012   CL 113 (H) 02/26/2016   CL 100 03/21/2012   CO2 22 02/26/2016   BUN 19 02/26/2016   BUN 14 03/21/2012   CREATININE 0.93 02/26/2016   CREATININE 0.87 03/21/2012   PROT 5.2 (L) 02/26/2016   PROT 6.6 03/21/2012   ALBUMIN 2.2 (L) 02/26/2016   ALBUMIN 4.5 03/21/2012   BILITOT 0.7 02/26/2016   BILITOT 0.3 03/21/2012   ALKPHOS 102 02/26/2016   ALKPHOS 100 03/21/2012   AST 47 (H) 02/26/2016   AST 17 03/21/2012   ALT 44 02/26/2016  .   Total Time in preparing paper work, data evaluation and todays exam - 4 minutes  Jani Gravel M.D on 02/26/2016 at 2:51 PM  [facility]   Office  [phone]

## 2016-02-26 NOTE — Clinical Social Work Note (Signed)
Pt d/c today back to [facility] with hospice. Pt's daughter, [name] and Alyse Low at facility aware and agreeable. CSW notified [facility] of d/c and faxed d/c summary. Pt to transport via [facility].   Benay Pike, [place]

## 2016-03-06 DIAGNOSIS — R945 Abnormal results of liver function studies: Secondary | ICD-10-CM

## 2016-03-12 ENCOUNTER — Encounter: Payer: Self-pay | Admitting: Internal Medicine

## 2016-03-30 ENCOUNTER — Encounter: Payer: Self-pay | Admitting: Internal Medicine

## 2016-04-08 DEATH — deceased

## 2016-04-27 ENCOUNTER — Ambulatory Visit: Payer: Self-pay | Admitting: Cardiovascular Disease

## 2023-02-01 ENCOUNTER — Encounter (HOSPITAL_COMMUNITY): Payer: Self-pay
